# Patient Record
Sex: Male | Born: 1945 | Race: White | Hispanic: No | Marital: Married | Smoking: Former smoker
Health system: Southern US, Community
[De-identification: ages and names within clinical notes are randomized; demographics above are authoritative.]

## PROBLEM LIST (undated history)

## (undated) DIAGNOSIS — C801 Malignant (primary) neoplasm, unspecified: Secondary | ICD-10-CM

## (undated) DIAGNOSIS — J189 Pneumonia, unspecified organism: Secondary | ICD-10-CM

## (undated) DIAGNOSIS — I491 Atrial premature depolarization: Secondary | ICD-10-CM

## (undated) DIAGNOSIS — I10 Essential (primary) hypertension: Secondary | ICD-10-CM

## (undated) DIAGNOSIS — M1990 Unspecified osteoarthritis, unspecified site: Secondary | ICD-10-CM

## (undated) DIAGNOSIS — I499 Cardiac arrhythmia, unspecified: Secondary | ICD-10-CM

## (undated) DIAGNOSIS — D696 Thrombocytopenia, unspecified: Secondary | ICD-10-CM

## (undated) DIAGNOSIS — H9190 Unspecified hearing loss, unspecified ear: Secondary | ICD-10-CM

## (undated) DIAGNOSIS — K219 Gastro-esophageal reflux disease without esophagitis: Secondary | ICD-10-CM

## (undated) DIAGNOSIS — R7989 Other specified abnormal findings of blood chemistry: Secondary | ICD-10-CM

## (undated) HISTORY — PX: HERNIA REPAIR: SHX51

## (undated) HISTORY — PX: TONSILLECTOMY: SUR1361

## (undated) HISTORY — DX: Atrial premature depolarization: I49.1

## (undated) HISTORY — DX: Essential (primary) hypertension: I10

## (undated) HISTORY — PX: OTHER SURGICAL HISTORY: SHX169

---

## 1968-10-24 HISTORY — PX: HERNIA REPAIR: SHX51

## 2003-04-14 ENCOUNTER — Emergency Department (HOSPITAL_COMMUNITY): Admission: EM | Admit: 2003-04-14 | Discharge: 2003-04-14 | Payer: Self-pay | Admitting: Emergency Medicine

## 2003-04-14 ENCOUNTER — Encounter: Payer: Self-pay | Admitting: Emergency Medicine

## 2005-10-29 ENCOUNTER — Emergency Department (HOSPITAL_COMMUNITY): Admission: EM | Admit: 2005-10-29 | Discharge: 2005-10-29 | Payer: Self-pay | Admitting: Emergency Medicine

## 2011-11-15 DIAGNOSIS — H9319 Tinnitus, unspecified ear: Secondary | ICD-10-CM | POA: Diagnosis not present

## 2011-11-15 DIAGNOSIS — H903 Sensorineural hearing loss, bilateral: Secondary | ICD-10-CM | POA: Diagnosis not present

## 2012-08-01 DIAGNOSIS — Z1331 Encounter for screening for depression: Secondary | ICD-10-CM | POA: Diagnosis not present

## 2012-08-01 DIAGNOSIS — R000 Tachycardia, unspecified: Secondary | ICD-10-CM | POA: Diagnosis not present

## 2012-08-01 DIAGNOSIS — Z23 Encounter for immunization: Secondary | ICD-10-CM | POA: Diagnosis not present

## 2012-08-01 DIAGNOSIS — Z0000 Encounter for general adult medical examination without abnormal findings: Secondary | ICD-10-CM | POA: Diagnosis not present

## 2012-08-01 DIAGNOSIS — Z131 Encounter for screening for diabetes mellitus: Secondary | ICD-10-CM | POA: Diagnosis not present

## 2012-08-01 DIAGNOSIS — E7800 Pure hypercholesterolemia, unspecified: Secondary | ICD-10-CM | POA: Diagnosis not present

## 2012-08-22 DIAGNOSIS — Z8241 Family history of sudden cardiac death: Secondary | ICD-10-CM | POA: Diagnosis not present

## 2012-08-22 DIAGNOSIS — Z0389 Encounter for observation for other suspected diseases and conditions ruled out: Secondary | ICD-10-CM | POA: Diagnosis not present

## 2012-08-22 DIAGNOSIS — R000 Tachycardia, unspecified: Secondary | ICD-10-CM | POA: Diagnosis not present

## 2012-10-04 DIAGNOSIS — H7310 Chronic myringitis, unspecified ear: Secondary | ICD-10-CM | POA: Diagnosis not present

## 2012-10-04 DIAGNOSIS — H9319 Tinnitus, unspecified ear: Secondary | ICD-10-CM | POA: Diagnosis not present

## 2012-10-04 DIAGNOSIS — H903 Sensorineural hearing loss, bilateral: Secondary | ICD-10-CM | POA: Diagnosis not present

## 2013-01-15 DIAGNOSIS — R000 Tachycardia, unspecified: Secondary | ICD-10-CM | POA: Diagnosis not present

## 2013-01-15 DIAGNOSIS — Z8241 Family history of sudden cardiac death: Secondary | ICD-10-CM | POA: Diagnosis not present

## 2013-01-15 DIAGNOSIS — R0602 Shortness of breath: Secondary | ICD-10-CM | POA: Diagnosis not present

## 2013-01-15 DIAGNOSIS — R002 Palpitations: Secondary | ICD-10-CM | POA: Diagnosis not present

## 2013-01-17 DIAGNOSIS — R002 Palpitations: Secondary | ICD-10-CM | POA: Diagnosis not present

## 2013-01-18 DIAGNOSIS — R002 Palpitations: Secondary | ICD-10-CM | POA: Diagnosis not present

## 2013-01-23 DIAGNOSIS — R002 Palpitations: Secondary | ICD-10-CM | POA: Diagnosis not present

## 2013-01-23 DIAGNOSIS — R000 Tachycardia, unspecified: Secondary | ICD-10-CM | POA: Diagnosis not present

## 2013-01-23 DIAGNOSIS — Z8241 Family history of sudden cardiac death: Secondary | ICD-10-CM | POA: Diagnosis not present

## 2013-01-23 DIAGNOSIS — R0602 Shortness of breath: Secondary | ICD-10-CM | POA: Diagnosis not present

## 2013-02-04 DIAGNOSIS — R000 Tachycardia, unspecified: Secondary | ICD-10-CM | POA: Diagnosis not present

## 2013-02-04 DIAGNOSIS — R002 Palpitations: Secondary | ICD-10-CM | POA: Diagnosis not present

## 2013-02-19 DIAGNOSIS — H2510 Age-related nuclear cataract, unspecified eye: Secondary | ICD-10-CM | POA: Diagnosis not present

## 2013-02-19 DIAGNOSIS — H524 Presbyopia: Secondary | ICD-10-CM | POA: Diagnosis not present

## 2013-02-19 DIAGNOSIS — H25019 Cortical age-related cataract, unspecified eye: Secondary | ICD-10-CM | POA: Diagnosis not present

## 2013-02-19 DIAGNOSIS — H179 Unspecified corneal scar and opacity: Secondary | ICD-10-CM | POA: Diagnosis not present

## 2013-05-21 DIAGNOSIS — H9319 Tinnitus, unspecified ear: Secondary | ICD-10-CM | POA: Diagnosis not present

## 2013-05-21 DIAGNOSIS — H903 Sensorineural hearing loss, bilateral: Secondary | ICD-10-CM | POA: Diagnosis not present

## 2013-05-21 DIAGNOSIS — H7310 Chronic myringitis, unspecified ear: Secondary | ICD-10-CM | POA: Diagnosis not present

## 2013-07-22 DIAGNOSIS — R000 Tachycardia, unspecified: Secondary | ICD-10-CM | POA: Diagnosis not present

## 2013-07-22 DIAGNOSIS — R002 Palpitations: Secondary | ICD-10-CM | POA: Diagnosis not present

## 2013-08-06 ENCOUNTER — Other Ambulatory Visit: Payer: Self-pay | Admitting: Internal Medicine

## 2013-08-06 DIAGNOSIS — Z1331 Encounter for screening for depression: Secondary | ICD-10-CM | POA: Diagnosis not present

## 2013-08-06 DIAGNOSIS — R000 Tachycardia, unspecified: Secondary | ICD-10-CM | POA: Diagnosis not present

## 2013-08-06 DIAGNOSIS — I779 Disorder of arteries and arterioles, unspecified: Secondary | ICD-10-CM | POA: Diagnosis not present

## 2013-08-06 DIAGNOSIS — Z79899 Other long term (current) drug therapy: Secondary | ICD-10-CM | POA: Diagnosis not present

## 2013-08-06 DIAGNOSIS — Z0000 Encounter for general adult medical examination without abnormal findings: Secondary | ICD-10-CM | POA: Diagnosis not present

## 2013-08-06 DIAGNOSIS — E7800 Pure hypercholesterolemia, unspecified: Secondary | ICD-10-CM | POA: Diagnosis not present

## 2013-08-07 ENCOUNTER — Ambulatory Visit
Admission: RE | Admit: 2013-08-07 | Discharge: 2013-08-07 | Disposition: A | Discharge status: Home / Self Care | Payer: Medicare Other | Source: Ambulatory Visit | Attending: Internal Medicine | Admitting: Internal Medicine

## 2013-08-07 DIAGNOSIS — Z0000 Encounter for general adult medical examination without abnormal findings: Secondary | ICD-10-CM

## 2013-08-07 DIAGNOSIS — I658 Occlusion and stenosis of other precerebral arteries: Secondary | ICD-10-CM | POA: Diagnosis not present

## 2013-08-08 DIAGNOSIS — Z23 Encounter for immunization: Secondary | ICD-10-CM | POA: Diagnosis not present

## 2014-01-24 DIAGNOSIS — J069 Acute upper respiratory infection, unspecified: Secondary | ICD-10-CM | POA: Diagnosis not present

## 2014-01-24 DIAGNOSIS — J209 Acute bronchitis, unspecified: Secondary | ICD-10-CM | POA: Diagnosis not present

## 2014-02-06 ENCOUNTER — Encounter (HOSPITAL_COMMUNITY): Payer: Self-pay | Admitting: Emergency Medicine

## 2014-02-06 ENCOUNTER — Emergency Department (HOSPITAL_COMMUNITY): Payer: Medicare Other

## 2014-02-06 ENCOUNTER — Inpatient Hospital Stay (HOSPITAL_COMMUNITY)
Admission: EM | Admit: 2014-02-06 | Discharge: 2014-02-10 | DRG: 871 | Disposition: A | Discharge status: Home / Self Care | Payer: Medicare Other | Attending: Internal Medicine | Admitting: Internal Medicine

## 2014-02-06 DIAGNOSIS — E871 Hypo-osmolality and hyponatremia: Secondary | ICD-10-CM | POA: Diagnosis present

## 2014-02-06 DIAGNOSIS — E860 Dehydration: Secondary | ICD-10-CM

## 2014-02-06 DIAGNOSIS — R7401 Elevation of levels of liver transaminase levels: Secondary | ICD-10-CM | POA: Diagnosis not present

## 2014-02-06 DIAGNOSIS — J209 Acute bronchitis, unspecified: Secondary | ICD-10-CM | POA: Diagnosis present

## 2014-02-06 DIAGNOSIS — E872 Acidosis, unspecified: Secondary | ICD-10-CM | POA: Diagnosis present

## 2014-02-06 DIAGNOSIS — I959 Hypotension, unspecified: Secondary | ICD-10-CM | POA: Diagnosis present

## 2014-02-06 DIAGNOSIS — Z79899 Other long term (current) drug therapy: Secondary | ICD-10-CM | POA: Diagnosis not present

## 2014-02-06 DIAGNOSIS — J189 Pneumonia, unspecified organism: Secondary | ICD-10-CM | POA: Diagnosis not present

## 2014-02-06 DIAGNOSIS — R209 Unspecified disturbances of skin sensation: Secondary | ICD-10-CM | POA: Diagnosis not present

## 2014-02-06 DIAGNOSIS — K7200 Acute and subacute hepatic failure without coma: Secondary | ICD-10-CM | POA: Diagnosis present

## 2014-02-06 DIAGNOSIS — Z87891 Personal history of nicotine dependence: Secondary | ICD-10-CM | POA: Diagnosis not present

## 2014-02-06 DIAGNOSIS — E876 Hypokalemia: Secondary | ICD-10-CM | POA: Diagnosis present

## 2014-02-06 DIAGNOSIS — R918 Other nonspecific abnormal finding of lung field: Secondary | ICD-10-CM | POA: Diagnosis not present

## 2014-02-06 DIAGNOSIS — R059 Cough, unspecified: Secondary | ICD-10-CM | POA: Diagnosis present

## 2014-02-06 DIAGNOSIS — R945 Abnormal results of liver function studies: Secondary | ICD-10-CM

## 2014-02-06 DIAGNOSIS — A419 Sepsis, unspecified organism: Secondary | ICD-10-CM | POA: Diagnosis not present

## 2014-02-06 DIAGNOSIS — R0602 Shortness of breath: Secondary | ICD-10-CM | POA: Diagnosis not present

## 2014-02-06 DIAGNOSIS — R0902 Hypoxemia: Secondary | ICD-10-CM | POA: Diagnosis present

## 2014-02-06 DIAGNOSIS — K7689 Other specified diseases of liver: Secondary | ICD-10-CM | POA: Diagnosis not present

## 2014-02-06 DIAGNOSIS — R110 Nausea: Secondary | ICD-10-CM | POA: Diagnosis not present

## 2014-02-06 DIAGNOSIS — R7402 Elevation of levels of lactic acid dehydrogenase (LDH): Secondary | ICD-10-CM | POA: Diagnosis not present

## 2014-02-06 DIAGNOSIS — D696 Thrombocytopenia, unspecified: Secondary | ICD-10-CM | POA: Diagnosis not present

## 2014-02-06 DIAGNOSIS — R7989 Other specified abnormal findings of blood chemistry: Secondary | ICD-10-CM | POA: Diagnosis not present

## 2014-02-06 DIAGNOSIS — R6520 Severe sepsis without septic shock: Secondary | ICD-10-CM

## 2014-02-06 DIAGNOSIS — R509 Fever, unspecified: Secondary | ICD-10-CM | POA: Diagnosis not present

## 2014-02-06 DIAGNOSIS — R05 Cough: Secondary | ICD-10-CM | POA: Diagnosis present

## 2014-02-06 DIAGNOSIS — J9819 Other pulmonary collapse: Secondary | ICD-10-CM | POA: Diagnosis not present

## 2014-02-06 DIAGNOSIS — E8729 Other acidosis: Secondary | ICD-10-CM | POA: Diagnosis present

## 2014-02-06 LAB — SAVE SMEAR

## 2014-02-06 LAB — COMPREHENSIVE METABOLIC PANEL
ALT: 38 U/L (ref 0–53)
AST: 56 U/L — AB (ref 0–37)
Albumin: 3.6 g/dL (ref 3.5–5.2)
Alkaline Phosphatase: 85 U/L (ref 39–117)
BUN: 18 mg/dL (ref 6–23)
CALCIUM: 8.6 mg/dL (ref 8.4–10.5)
CO2: 20 mEq/L (ref 19–32)
CREATININE: 0.89 mg/dL (ref 0.50–1.35)
Chloride: 98 mEq/L (ref 96–112)
GFR calc non Af Amer: 86 mL/min — ABNORMAL LOW (ref >90)
GLUCOSE: 98 mg/dL (ref 70–99)
Potassium: 3.4 mEq/L — ABNORMAL LOW (ref 3.7–5.3)
Sodium: 136 mEq/L — ABNORMAL LOW (ref 137–147)
TOTAL PROTEIN: 7.3 g/dL (ref 6.0–8.3)
Total Bilirubin: 0.8 mg/dL (ref 0.3–1.2)

## 2014-02-06 LAB — CBC WITH DIFFERENTIAL/PLATELET
BASOS ABS: 0 10*3/uL (ref 0.0–0.1)
Basophils Relative: 0 % (ref 0–1)
EOS ABS: 0.1 10*3/uL (ref 0.0–0.7)
Eosinophils Relative: 1 % (ref 0–5)
HCT: 42.4 % (ref 39.0–52.0)
Hemoglobin: 14.8 g/dL (ref 13.0–17.0)
Lymphocytes Relative: 7 % — ABNORMAL LOW (ref 12–46)
Lymphs Abs: 0.4 10*3/uL — ABNORMAL LOW (ref 0.7–4.0)
MCH: 31 pg (ref 26.0–34.0)
MCHC: 34.9 g/dL (ref 30.0–36.0)
MCV: 88.7 fL (ref 78.0–100.0)
Monocytes Absolute: 0.1 10*3/uL (ref 0.1–1.0)
Monocytes Relative: 2 % — ABNORMAL LOW (ref 3–12)
NEUTROS PCT: 90 % — AB (ref 43–77)
Neutro Abs: 5.2 10*3/uL (ref 1.7–7.7)
PLATELETS: 105 10*3/uL — AB (ref 150–400)
RBC: 4.78 MIL/uL (ref 4.22–5.81)
RDW: 12.8 % (ref 11.5–15.5)
WBC: 5.8 10*3/uL (ref 4.0–10.5)

## 2014-02-06 LAB — CBC
HEMATOCRIT: 37.1 % — AB (ref 39.0–52.0)
HEMOGLOBIN: 12.8 g/dL — AB (ref 13.0–17.0)
MCH: 30.5 pg (ref 26.0–34.0)
MCHC: 34.5 g/dL (ref 30.0–36.0)
MCV: 88.3 fL (ref 78.0–100.0)
Platelets: 80 10*3/uL — ABNORMAL LOW (ref 150–400)
RBC: 4.2 MIL/uL — ABNORMAL LOW (ref 4.22–5.81)
RDW: 13 % (ref 11.5–15.5)
WBC: 7.9 10*3/uL (ref 4.0–10.5)

## 2014-02-06 LAB — TROPONIN I: Troponin I: <0.3 ng/mL (ref <0.30)

## 2014-02-06 LAB — PRO B NATRIURETIC PEPTIDE: Pro B Natriuretic peptide (BNP): 67 pg/mL (ref 0–125)

## 2014-02-06 LAB — INFLUENZA PANEL BY PCR (TYPE A & B)
H1N1FLUPCR: NOT DETECTED
INFLBPCR: NEGATIVE
Influenza A By PCR: NEGATIVE

## 2014-02-06 LAB — I-STAT CG4 LACTIC ACID, ED: Lactic Acid, Venous: 4.36 mmol/L — ABNORMAL HIGH (ref 0.5–2.2)

## 2014-02-06 MED ORDER — ONDANSETRON HCL 4 MG/2ML IJ SOLN
4.0000 mg | Freq: Once | INTRAMUSCULAR | Status: AC
  Administered 2014-02-06: 4 mg via INTRAVENOUS
  Filled 2014-02-06: qty 2

## 2014-02-06 MED ORDER — ONDANSETRON HCL 4 MG/2ML IJ SOLN
4.0000 mg | Freq: Four times a day (QID) | INTRAMUSCULAR | Status: DC | PRN
  Administered 2014-02-06: 4 mg via INTRAVENOUS
  Filled 2014-02-06: qty 2

## 2014-02-06 MED ORDER — SODIUM CHLORIDE 0.9 % IJ SOLN
3.0000 mL | Freq: Two times a day (BID) | INTRAMUSCULAR | Status: DC
  Administered 2014-02-09 (×2): 3 mL via INTRAVENOUS

## 2014-02-06 MED ORDER — HEPARIN SODIUM (PORCINE) 5000 UNIT/ML IJ SOLN
5000.0000 [IU] | Freq: Three times a day (TID) | INTRAMUSCULAR | Status: DC
  Administered 2014-02-06 – 2014-02-08 (×5): 5000 [IU] via SUBCUTANEOUS
  Filled 2014-02-06 (×8): qty 1

## 2014-02-06 MED ORDER — DEXTROSE 5 % IV SOLN
500.0000 mg | Freq: Once | INTRAVENOUS | Status: AC
  Administered 2014-02-06: 500 mg via INTRAVENOUS

## 2014-02-06 MED ORDER — ACETAMINOPHEN 650 MG RE SUPP: 650.0000 mg | Freq: Four times a day (QID) | RECTAL | Status: DC | PRN

## 2014-02-06 MED ORDER — POTASSIUM CHLORIDE CRYS ER 20 MEQ PO TBCR
40.0000 meq | EXTENDED_RELEASE_TABLET | Freq: Two times a day (BID) | ORAL | Status: DC
  Filled 2014-02-06: qty 4

## 2014-02-06 MED ORDER — IBUPROFEN 800 MG PO TABS
800.0000 mg | ORAL_TABLET | Freq: Once | ORAL | Status: AC
  Administered 2014-02-06: 800 mg via ORAL
  Filled 2014-02-06: qty 1

## 2014-02-06 MED ORDER — POLYETHYLENE GLYCOL 3350 17 G PO PACK
17.0000 g | PACK | Freq: Every day | ORAL | Status: DC | PRN
  Filled 2014-02-06: qty 1

## 2014-02-06 MED ORDER — SODIUM CHLORIDE 0.9 % IV SOLN
INTRAVENOUS | Status: DC
  Administered 2014-02-06 – 2014-02-08 (×6): via INTRAVENOUS

## 2014-02-06 MED ORDER — DEXTROSE 5 % IV SOLN
500.0000 mg | Freq: Every day | INTRAVENOUS | Status: DC
  Administered 2014-02-07 – 2014-02-09 (×4): 500 mg via INTRAVENOUS
  Filled 2014-02-06 (×5): qty 500

## 2014-02-06 MED ORDER — ONDANSETRON HCL 4 MG PO TABS: 4.0000 mg | ORAL_TABLET | Freq: Four times a day (QID) | ORAL | Status: DC | PRN

## 2014-02-06 MED ORDER — SODIUM CHLORIDE 0.9 % IV BOLUS (SEPSIS)
500.0000 mL | Freq: Once | INTRAVENOUS | Status: AC
  Administered 2014-02-06: 500 mL via INTRAVENOUS

## 2014-02-06 MED ORDER — ACETAMINOPHEN 325 MG PO TABS
650.0000 mg | ORAL_TABLET | Freq: Once | ORAL | Status: DC
  Filled 2014-02-06: qty 2

## 2014-02-06 MED ORDER — ACETAMINOPHEN 325 MG PO TABS
650.0000 mg | ORAL_TABLET | Freq: Four times a day (QID) | ORAL | Status: DC | PRN
  Administered 2014-02-06 – 2014-02-09 (×8): 650 mg via ORAL
  Filled 2014-02-06 (×8): qty 2

## 2014-02-06 MED ORDER — MAGNESIUM OXIDE 400 (241.3 MG) MG PO TABS
400.0000 mg | ORAL_TABLET | Freq: Two times a day (BID) | ORAL | Status: DC
  Administered 2014-02-07: 400 mg via ORAL
  Filled 2014-02-06 (×2): qty 1

## 2014-02-06 MED ORDER — DEXTROSE 5 % IV SOLN
1.0000 g | INTRAVENOUS | Status: DC
  Administered 2014-02-06 – 2014-02-09 (×4): 1 g via INTRAVENOUS
  Filled 2014-02-06 (×6): qty 10

## 2014-02-06 MED ORDER — SODIUM CHLORIDE 0.9 % IV BOLUS (SEPSIS)
1000.0000 mL | Freq: Once | INTRAVENOUS | Status: AC
  Administered 2014-02-06: 1000 mL via INTRAVENOUS

## 2014-02-06 MED ORDER — ACETAMINOPHEN 325 MG PO TABS
325.0000 mg | ORAL_TABLET | Freq: Once | ORAL | Status: AC
  Administered 2014-02-06: 325 mg via ORAL

## 2014-02-06 NOTE — H&P (Signed)
[facility] History and Physical  Dartanian Knaggs JQB:[id number] DOB: 1946/03/18 DOA: 02/06/2014  Referring physician: Dr. Wyvonnia Dusky PCP: No primary provider on file.   Chief Complaint: fever cough  HPI: Geoffrey Duran is a 68 y.o. male  With no significant past medical history that comes in for productive cough and fevers. He relates this started about 2 weeks prior to admission he went to see his primary care doctor who started him on Levaquin. He does relate that the cough improved and he was feeling much better but recurred. He went to his primary care doctor today to see if see how he was doing and he was having fever with ongoing productive cough he was given a shot of Rocephin and sent to the emergency room. He relates no recent dental procedures urological or any colonoscopies. No cuts or sores. No sick contacts, per patient he was tested for the flu and was negative. [place] and he relates no recent tick bites no cuts by any animals. No recent travel history no nausea vomiting or diarrhea. He relates no rashes  In the ED: Blood cultures were drawn he got a dose of IV azithromycin, A basic metabolic panel was done that shows mild hyponatremia with hypokalemia lactic acid of 4, chest x-ray as below. A CBC was done that shows mild thrombocytopenia normal white count with no left shift. He was initially hypoxic when he came to the ED to 84%.  Review of Systems:  Constitutional:  No weight loss, night sweats, Fevers, chills, fatigue.  HEENT:  No headaches, Difficulty swallowing,Tooth/dental problems,Sore throat,  No sneezing, itching, ear ache, nasal congestion, post nasal drip,  Cardio-vascular:  No chest pain, Orthopnea, PND, swelling in lower extremities, anasarca, dizziness, palpitations  GI:  No heartburn, indigestion, abdominal pain, nausea, vomiting, diarrhea, change in bowel habits, loss of appetite  Resp:  No shortness of breath with exertion or at rest. No  excess mucus, no productive cough, No non-productive cough, No coughing up of blood.No change in color of mucus.No wheezing.No chest wall deformity  Skin:  no rash or lesions.  GU:  no dysuria, change in color of urine, no urgency or frequency. No flank pain.  Musculoskeletal:  No joint pain or swelling. No decreased range of motion. No back pain.  Psych:  No change in mood or affect. No depression or anxiety. No memory loss.   Past Medical History  Diagnosis Date  . Heart flutter, ventricular    Past Surgical History  Procedure Laterality Date  . Bilateral rotator surgery     Social History:  reports that he quit smoking about 46 years ago. He does not have any smokeless tobacco history on file. He reports that he does not drink alcohol. His drug history is not on file.  No Known Allergies  Family History  Problem Relation Age of Onset  . Sudden death Mother      Prior to Admission medications   Medication Sig Start Date End Date Taking? Authorizing Provider  Guaifenesin-Codeine (CHERATUSSIN AC PO) Take 10 mLs by mouth every 6 (six) hours as needed. cough   Yes Historical Provider, MD  levofloxacin (LEVAQUIN) 750 MG tablet Take 750 mg by mouth daily. Started this am for respiratory infection.   Yes Historical Provider, MD  metoprolol succinate (TOPROL-XL) 25 MG 24 hr tablet Take 25 mg by mouth daily.   Yes Historical Provider, MD  omeprazole (PRILOSEC) 20 MG capsule Take 20 mg by mouth daily.  Yes Historical Provider, MD   Physical Exam: Filed Vitals:   02/06/14 1432  BP:   Pulse:   Temp: 103.1 F (39.5 C)  Resp:     BP 128/53  Pulse 106  Temp(Src) 103.1 F (39.5 C) (Rectal)  Resp 20  Ht 6' (1.829 m)  Wt 86.183 kg (190 lb)  BMI 25.76 kg/m2  SpO2 95%  General:  Appears calm and uncomfortable Eyes: PERRL, normal lids, irises & conjunctiva ENT: grossly normal hearing, lips & tongue Neck: no LAD, masses or thyromegaly Cardiovascular: RRR, no m/r/g. No LE  edema. Respiratory: CTA bilaterally, no w/r/r. Normal respiratory effort. Abdomen: soft, ntnd Skin: no rash or induration seen on limited exam Musculoskeletal: grossly normal tone BUE/BLE Psychiatric: grossly normal mood and affect, speech fluent and appropriate Neurologic: grossly non-focal.          Labs on Admission:  Basic Metabolic Panel:  Recent Labs Lab 02/06/14 1410  NA 136*  K 3.4*  CL 98  CO2 20  GLUCOSE 98  BUN 18  CREATININE 0.89  CALCIUM 8.6   Liver Function Tests:  Recent Labs Lab 02/06/14 1410  AST 56*  ALT 38  ALKPHOS 85  BILITOT 0.8  PROT 7.3  ALBUMIN 3.6   No results found for this basename: LIPASE, AMYLASE,  in the last 168 hours No results found for this basename: AMMONIA,  in the last 168 hours CBC:  Recent Labs Lab 02/06/14 1410  WBC 5.8  NEUTROABS 5.2  HGB 14.8  HCT 42.4  MCV 88.7  PLT 105*   Cardiac Enzymes:  Recent Labs Lab 02/06/14 1410  TROPONINI <0.30    BNP (last 3 results)  Recent Labs  02/06/14 1410  PROBNP 67.0   CBG: No results found for this basename: GLUCAP,  in the last 168 hours  Radiological Exams on Admission: Ct Head Wo Contrast  02/06/2014   CLINICAL DATA:  Strange disoriented tingling after receiving an injection  EXAM: CT HEAD WITHOUT CONTRAST  TECHNIQUE: Contiguous axial images were obtained from the base of the skull through the vertex without intravenous contrast.  COMPARISON:  None.  FINDINGS: The ventricles are normal in size and position. There is no intracranial hemorrhage nor intracranial mass effect. There is no evidence of an evolving ischemic infarction. The cerebellum and brainstem are normal in density. There are no abnormal intracranial calcifications.  At bone window settings there is soft tissue fullness in the left maxillary sinus most compatible with a retention cyst or polyp. The mastoid air cells are well pneumatized.  IMPRESSION: There is no evidence of an acute ischemic or  hemorrhagic event. There is no intracranial mass effect or hydrocephalus. No other acute intracranial abnormality is demonstrated either.   Electronically Signed   By: David  Martinique   On: 02/06/2014 15:54   Dg Chest Port 1 View  02/06/2014   CLINICAL DATA:  Short of breath, nausea  EXAM: PORTABLE CHEST - 1 VIEW  COMPARISON:  None.  FINDINGS: Normal cardiac silhouette. Lung bases are clear. No effusion, infiltrate, or pneumothorax. Mild left basilar atelectasis.  IMPRESSION: Mild left basilar atelectasis.  Otherwise no acute findings   Electronically Signed   By: Suzy Bouchard M.D.   On: 02/06/2014 14:39    EKG: Independently reviewed. Sinus rhythm  Assessment/Plan: Sepsis/Fever and chills/ Cough/anion gap metabolic acidosis: - We'll given 2 L of normal saline and admitted to step down, check strict I.'s and O's and continue him on normal saline. - Most likely cause  of fever chills and cough is a lower respiratory tract infection chest x-ray was done that shows no acute infiltrates, so the most likely cause would be an acute bronchitis. We'll go ahead and start him on Rocephin and azithromycin check blood cultures times. He could also be significantly dehydrated and a pneumonia not show up on the chest x-ray. So after he is well hydrated we could repeat the chest x-ray. - Use Tylenol for fevers. - Check an HIV influenza panel and urine Legionella. - Repeat lactic acid in the morning.  Thrombocytopenia: - LFTs are within normal limits, this could be secondary to sepsis. He is on no medications that would cause thrombocytopenia. Recheck a CBC with differential tomorrow morning. - Check peripheral smear.  Hypokalemia: Due to decreased intravascular volume replete. Also repeat a mag.  Code Status: full Family Communication: wife Disposition Plan: inpatient  Time spent: 28 minutes  [facility] Pager [phone]

## 2014-02-06 NOTE — ED Notes (Signed)
Shortness of breath, back pain and chest pain r/t coughing.  Patient was on antibiotics two weeks ago for flu like symptoms and coughing for ten days, symptoms returned and he went to dr this morning and received rocephin shot and levaquin po. He also has left back/flank pain.  Tested negative at dr office for flu.

## 2014-02-06 NOTE — ED Notes (Signed)
MD at bedside. 

## 2014-02-06 NOTE — ED Notes (Signed)
NOTIFIED DR. Wilson Singer OF PATIENTS LAB RESULTS OF CG4+ LACTIC ACID ,02/06/2014.

## 2014-02-06 NOTE — ED Provider Notes (Signed)
CSN: [id number]     Arrival date & time 02/06/14  1221 History   First MD Initiated Contact with Patient 02/06/14 1237     Chief Complaint  Patient presents with  . Shortness of Breath     (Consider location/radiation/quality/duration/timing/severity/associated sxs/prior Treatment) The history is provided by the patient and the spouse. No language interpreter was used.  Geoffrey Duran is a 68 year old male with past medical history of heart flutter followed by cardiology presenting to the ED with chest pain and shortness of breath. As per patient, reported that he has not been feeling well for the past 2 weeks. Stated he was seen and assessed by his primary care provider who started patient on Levaquin and was taken for 10 days. Reported that last night he started to experience chest pain localized to the Center of his chest described as a pressure sensation that is intermittent with drifting to either side of the chest wall. Stated that he is associated shortness of breath that worsened this morning. Stated that he became very restless and has been experiencing lower back pain. Wife reported that patient was seen and assessed by his primary care provider this morning who gave the patient a shot of Rocephin and started the patient on Levaquin again. Wife reported that patient started to experience shakes and tremors short after Rocephin was administered, wife became concerned and was nervous that patient was having an allergic reaction to the medication. Stated that later in the afternoon patient experienced headache with onset of fever and onset of neck discomfort-patient reports he continuously has neck pain and that this is nothing new. Stated that patient has been having a cough, mainly dry, was productive last week. Wife reported that patient has cyanosis of the hands, was brought to the fire department where EMS picked the patient up and brought him to the ED. Patient reports he is aching all over  generalized. Denied numbness, tingling, weakness, sudden loss of vision, blurred vision, photophobia, headache, melena, hematochezia, dysuria, hematuria. PCP none  Past Medical History  Diagnosis Date  . Heart flutter, ventricular    Past Surgical History  Procedure Laterality Date  . Bilateral rotator surgery     Family History  Problem Relation Age of Onset  . Sudden death Mother    History  Substance Use Topics  . Smoking status: Former Smoker    Quit date: 02/07/1968  . Smokeless tobacco: Not on file  . Alcohol Use: No    Review of Systems  Constitutional: Negative for fever and chills.  Respiratory: Positive for cough and shortness of breath. Negative for chest tightness.   Cardiovascular: Positive for chest pain.  Gastrointestinal: Positive for nausea. Negative for vomiting, abdominal pain, diarrhea, constipation, blood in stool and anal bleeding.  Musculoskeletal: Negative for back pain and neck pain.  Neurological: Positive for headaches. Negative for dizziness and weakness.  All other systems reviewed and are negative.     Allergies  Review of patient's allergies indicates no known allergies.  Home Medications   Prior to Admission medications   Not on File   BP 94/50  Pulse 86  Temp(Src) 100.4 F (38 C) (Oral)  Resp 22  Ht 6' (1.829 m)  Wt 198 lb 10.2 oz (90.1 kg)  BMI 26.93 kg/m2  SpO2 92% Physical Exam  Nursing note and vitals reviewed. Constitutional: He is oriented to person, place, and time. He appears well-developed and well-nourished. No distress.  HENT:  Head: Normocephalic and atraumatic.  Dry mucous membranes  noted  Eyes: Conjunctivae and EOM are normal. Pupils are equal, round, and reactive to light. Right eye exhibits no discharge. Left eye exhibits no discharge.  Neck: Normal range of motion. Neck supple. No tracheal deviation present.  Negative neck stiffness Negative nuchal rigidity Negative cervical lymphadenopathy Negative  meningeal signs  Cardiovascular: Normal rate, regular rhythm and normal heart sounds.   Cap refill less than 3 seconds Negative swelling or pitting edema identified to lower tremors bilaterally  Pulmonary/Chest: Effort normal. No respiratory distress. He has no wheezes. He has rales.  Decreased breath sounds to bilateral lower lobes Rales identified in the left lower lobe  Abdominal: Soft. Bowel sounds are normal. There is no tenderness. There is no guarding.  Musculoskeletal: Normal range of motion.  Full ROM to upper and lower extremities without difficulty noted, negative ataxia noted.  Lymphadenopathy:    He has no cervical adenopathy.  Neurological: He is alert and oriented to person, place, and time. No cranial nerve deficit. He exhibits normal muscle tone. Coordination normal.  Cranial nerves III-XII grossly intact Strength 5+/5+ to upper and lower extremities bilaterally with resistance applied, equal distribution noted Equal grip strength Negative facial drooping Negative slurred speech Gait proper, proper balance - negative sway, negative drift, negative step-offs  Skin: Skin is warm and dry. No rash noted. He is not diaphoretic. No erythema.  Psychiatric: He has a normal mood and affect. His behavior is normal. Thought content normal.    ED Course  Procedures (including critical care time)  Patient seen and assessed by attending physician  - does not believe to be meningitis - does not recommend spinal tap at this time.   4:21 PM This provider spoke with Dr. Charlett Blake - discussed case, history, presentation, labs, imaging in great detail - this provider recommended a StepDown bed due to sepsis, admitting physician reported MedSurg bed. Patient to be admitted to [facility] as inpatient.   Results for orders placed during the hospital encounter of 02/06/14  CBC WITH DIFFERENTIAL      Result Value Ref Range   WBC 5.8  4.0 - 10.5 K/uL   RBC 4.78  4.22 - 5.81 MIL/uL   Hemoglobin  14.8  13.0 - 17.0 g/dL   HCT 42.4  39.0 - 52.0 %   MCV 88.7  78.0 - 100.0 fL   MCH 31.0  26.0 - 34.0 pg   MCHC 34.9  30.0 - 36.0 g/dL   RDW 12.8  11.5 - 15.5 %   Platelets 105 (*) 150 - 400 K/uL   Neutrophils Relative % 90 (*) 43 - 77 %   Lymphocytes Relative 7 (*) 12 - 46 %   Monocytes Relative 2 (*) 3 - 12 %   Eosinophils Relative 1  0 - 5 %   Basophils Relative 0  0 - 1 %   Neutro Abs 5.2  1.7 - 7.7 K/uL   Lymphs Abs 0.4 (*) 0.7 - 4.0 K/uL   Monocytes Absolute 0.1  0.1 - 1.0 K/uL   Eosinophils Absolute 0.1  0.0 - 0.7 K/uL   Basophils Absolute 0.0  0.0 - 0.1 K/uL   RBC Morphology POLYCHROMASIA PRESENT     Smear Review LARGE PLATELETS PRESENT    COMPREHENSIVE METABOLIC PANEL      Result Value Ref Range   Sodium 136 (*) 137 - 147 mEq/L   Potassium 3.4 (*) 3.7 - 5.3 mEq/L   Chloride 98  96 - 112 mEq/L   CO2 20  19 -  32 mEq/L   Glucose, Bld 98  70 - 99 mg/dL   BUN 18  6 - 23 mg/dL   Creatinine, Ser 0.89  0.50 - 1.35 mg/dL   Calcium 8.6  8.4 - 10.5 mg/dL   Total Protein 7.3  6.0 - 8.3 g/dL   Albumin 3.6  3.5 - 5.2 g/dL   AST 56 (*) 0 - 37 U/L   ALT 38  0 - 53 U/L   Alkaline Phosphatase 85  39 - 117 U/L   Total Bilirubin 0.8  0.3 - 1.2 mg/dL   GFR calc non Af Amer 86 (*) >90 mL/min   GFR calc Af Amer >90  >90 mL/min  TROPONIN I      Result Value Ref Range   Troponin I <0.30  <0.30 ng/mL  PRO B NATRIURETIC PEPTIDE      Result Value Ref Range   Pro B Natriuretic peptide (BNP) 67.0  0 - 125 pg/mL  INFLUENZA PANEL BY PCR (TYPE A & B, H1N1)      Result Value Ref Range   Influenza A By PCR NEGATIVE  NEGATIVE   Influenza B By PCR NEGATIVE  NEGATIVE   H1N1 flu by pcr NOT DETECTED  NOT DETECTED  SAVE SMEAR      Result Value Ref Range   Smear Review SMEAR STAINED AND AVAILABLE FOR REVIEW    CBC      Result Value Ref Range   WBC 7.9  4.0 - 10.5 K/uL   RBC 4.20 (*) 4.22 - 5.81 MIL/uL   Hemoglobin 12.8 (*) 13.0 - 17.0 g/dL   HCT 37.1 (*) 39.0 - 52.0 %   MCV 88.3  78.0 - 100.0  fL   MCH 30.5  26.0 - 34.0 pg   MCHC 34.5  30.0 - 36.0 g/dL   RDW 13.0  11.5 - 15.5 %   Platelets 80 (*) 150 - 400 K/uL  I-STAT CG4 LACTIC ACID, ED      Result Value Ref Range   Lactic Acid, Venous 4.36 (*) 0.5 - 2.2 mmol/L    Labs Review Labs Reviewed  CBC WITH DIFFERENTIAL - Abnormal; Notable for the following:    Platelets 105 (*)    Neutrophils Relative % 90 (*)    Lymphocytes Relative 7 (*)    Monocytes Relative 2 (*)    Lymphs Abs 0.4 (*)    All other components within normal limits  COMPREHENSIVE METABOLIC PANEL - Abnormal; Notable for the following:    Sodium 136 (*)    Potassium 3.4 (*)    AST 56 (*)    GFR calc non Af Amer 86 (*)    All other components within normal limits  CBC - Abnormal; Notable for the following:    RBC 4.20 (*)    Hemoglobin 12.8 (*)    HCT 37.1 (*)    Platelets 80 (*)    All other components within normal limits  I-STAT CG4 LACTIC ACID, ED - Abnormal; Notable for the following:    Lactic Acid, Venous 4.36 (*)    All other components within normal limits  URINE CULTURE  CULTURE, BLOOD (ROUTINE X 2)  CULTURE, BLOOD (ROUTINE X 2)  CULTURE, EXPECTORATED SPUTUM-ASSESSMENT  GRAM STAIN  MRSA PCR SCREENING  TROPONIN I  PRO B NATRIURETIC PEPTIDE  INFLUENZA PANEL BY PCR (TYPE A & B, H1N1)  SAVE SMEAR  HIV ANTIBODY (ROUTINE TESTING)  LEGIONELLA ANTIGEN, URINE  STREP PNEUMONIAE URINARY ANTIGEN  CREATININE, SERUM  CBC  COMPREHENSIVE METABOLIC PANEL  LACTIC ACID, PLASMA    Imaging Review Ct Head Wo Contrast  02/06/2014   CLINICAL DATA:  Strange disoriented tingling after receiving an injection  EXAM: CT HEAD WITHOUT CONTRAST  TECHNIQUE: Contiguous axial images were obtained from the base of the skull through the vertex without intravenous contrast.  COMPARISON:  None.  FINDINGS: The ventricles are normal in size and position. There is no intracranial hemorrhage nor intracranial mass effect. There is no evidence of an evolving ischemic  infarction. The cerebellum and brainstem are normal in density. There are no abnormal intracranial calcifications.  At bone window settings there is soft tissue fullness in the left maxillary sinus most compatible with a retention cyst or polyp. The mastoid air cells are well pneumatized.  IMPRESSION: There is no evidence of an acute ischemic or hemorrhagic event. There is no intracranial mass effect or hydrocephalus. No other acute intracranial abnormality is demonstrated either.   Electronically Signed   By: David  Martinique   On: 02/06/2014 15:54   Dg Chest Port 1 View  02/06/2014   CLINICAL DATA:  Short of breath, nausea  EXAM: PORTABLE CHEST - 1 VIEW  COMPARISON:  None.  FINDINGS: Normal cardiac silhouette. Lung bases are clear. No effusion, infiltrate, or pneumothorax. Mild left basilar atelectasis.  IMPRESSION: Mild left basilar atelectasis.  Otherwise no acute findings   Electronically Signed   By: Suzy Bouchard M.D.   On: 02/06/2014 14:39     EKG Interpretation   Date/Time:  Thursday February 06 2014 13:02:19 EDT Ventricular Rate:  94 PR Interval:  167 QRS Duration: 88 QT Interval:  342 QTC Calculation: 428 R Axis:   45 Text Interpretation:  Sinus rhythm Low voltage, extremity leads No  previous ECGs available Confirmed by Wyvonnia Dusky  MD, STEPHEN [phone]) on  02/06/2014 1:14:00 PM      MDM   Final diagnoses:  Sepsis  CAP (community acquired pneumonia)  Fever  Dehydration   Medications  heparin injection 5,000 Units (5,000 Units Subcutaneous Given 02/06/14 2128)  sodium chloride 0.9 % injection 3 mL (3 mLs Intravenous Not Given 02/06/14 2117)  0.9 %  sodium chloride infusion ( Intravenous New Bag/Given 02/06/14 2326)  acetaminophen (TYLENOL) tablet 650 mg (650 mg Oral Given 02/06/14 2127)    Or  acetaminophen (TYLENOL) suppository 650 mg ( Rectal See Alternative 02/06/14 2127)  polyethylene glycol (MIRALAX / GLYCOLAX) packet 17 g (not administered)  ondansetron (ZOFRAN) tablet 4 mg (  Oral See Alternative 02/06/14 2128)    Or  ondansetron (ZOFRAN) injection 4 mg (4 mg Intravenous Given 02/06/14 2128)  cefTRIAXone (ROCEPHIN) 1 g in dextrose 5 % 50 mL IVPB (1 g Intravenous New Bag/Given 02/06/14 2253)  azithromycin (ZITHROMAX) 500 mg in dextrose 5 % 250 mL IVPB (500 mg Intravenous Given 02/07/14 0013)  magnesium oxide (MAG-OX) tablet 400 mg (not administered)  potassium chloride SA (K-DUR,KLOR-CON) CR tablet 40 mEq (40 mEq Oral Given 02/07/14 0030)  sodium chloride 0.9 % bolus 1,000 mL (not administered)  antiseptic oral rinse (BIOTENE) solution 15 mL (not administered)  sodium chloride 0.9 % bolus 1,000 mL (0 mLs Intravenous Stopped 02/06/14 1843)  ondansetron (ZOFRAN) injection 4 mg (4 mg Intravenous Given 02/06/14 1459)  acetaminophen (TYLENOL) tablet 325 mg (325 mg Oral Given 02/06/14 1459)  azithromycin (ZITHROMAX) 500 mg in dextrose 5 % 250 mL IVPB (0 mg Intravenous Stopped 02/06/14 1654)  sodium chloride 0.9 % bolus 1,000 mL (0 mLs Intravenous Stopped 02/06/14 1714)  sodium chloride  0.9 % bolus 500 mL (0 mLs Intravenous Stopped 02/06/14 2212)  sodium chloride 0.9 % bolus 1,000 mL (0 mLs Intravenous Stopped 02/06/14 2104)  ibuprofen (ADVIL,MOTRIN) tablet 800 mg (800 mg Oral Given 02/06/14 2246)  sodium chloride 0.9 % bolus 1,000 mL (1,000 mLs Intravenous New Bag/Given 02/06/14 2214)    Patient presenting to the ED with chest pain shortness of breath that started last night. Reported the pain is localized to the Center of his chest described as a pressure sensation that Gerald Stabs T. the side of the chest wall. Stated that he has been having shortness of breath associated with his chest pain as well as when at rest. Stated that she had a headache that started today shortly after being given an IM injection of resection when he was seen by his primary care provider. Stated that he continues to have headaches. Reported fever-denied use of Tylenol ibuprofen. Stated that he has not been feeling  well for the past 2 weeks, was placed on Levaquin last week with minimal relief. Alert and oriented. GCS 15. Heart rate tachycardic, normal rhythm. Radial and DP pulses 2+ bilaterally. Negative cyanosis noted. Cap refill less than 3 seconds. Decreased breath sounds identified to lower lobes bilaterally-Rales and crackles noted to left lower lobe. Bowel sounds normoactive in all 4 quadrants-negative pain upon palpation-abdomen soft and nontender. Full range of motion to upper and lower extremities bilaterally without difficulty or ataxia. Strength intact with equal distribution, equal grip strength. Negative facial droop or slurred speech noted. Upon arrival to the ED patient oral temperature of 102.65F, rectal was 103.69F. Pulse ox is 84% while on room air upon arrival to the ED-patient does not use oxygen therapy at home. Patient placed on 2 L of oxygen via nasal cannula and has increased to 96% on room air. Tylenol and minister in ED setting. IV fluids started. EKG noted normal sinus rhythm with a heart rate of 94 beats per minute-no previous EKG noted. First troponin negative elevation. CBC negative elevated white blood cell count identified. CMP noted mild low potassium of 3.4, sodium of 136. Mildly elevated AST of 56. BNP negative elevation. Lactic acid elevated at 4.36. CT head negative acute intracranial abnormalities noted-negative acute ischemia, hemorrhagic event noted. Chest x-ray mild left basilar atelectasis noted with negative acute infiltrate, pneumothorax or effusion noted. CT angiogram of chest ordered by attending physician. Patient presents with shortness of breath, fever. IV fluids and IV antibiotics administered in ED setting for coverage of pneumonia. Patient appears dehydrated. Patient to be admitted to the hospital - sepsis noted with tachycardia, hypoxia, fever. Suspicion of sepsis to be possibly due to pneumonia versus bronchitis. Patient to be admitted to the hospital at step down as  inpatient. Discussed plan for admission the patient agreed to plan.  Jamse Mead, PA-C 02/06/14 1809  Jamse Mead, PA-C 02/07/14 0050  Jamse Mead, PA-C 02/07/14 [id number]

## 2014-02-07 DIAGNOSIS — A419 Sepsis, unspecified organism: Secondary | ICD-10-CM | POA: Diagnosis not present

## 2014-02-07 LAB — URINALYSIS, ROUTINE W REFLEX MICROSCOPIC
Bilirubin Urine: NEGATIVE
GLUCOSE, UA: NEGATIVE mg/dL
Ketones, ur: NEGATIVE mg/dL
Leukocytes, UA: NEGATIVE
Nitrite: NEGATIVE
Protein, ur: NEGATIVE mg/dL
Specific Gravity, Urine: 1.011 (ref 1.005–1.030)
Urobilinogen, UA: 1 mg/dL (ref 0.0–1.0)
pH: 5.5 (ref 5.0–8.0)

## 2014-02-07 LAB — COMPREHENSIVE METABOLIC PANEL
ALBUMIN: 2.6 g/dL — AB (ref 3.5–5.2)
ALT: 99 U/L — ABNORMAL HIGH (ref 0–53)
AST: 149 U/L — ABNORMAL HIGH (ref 0–37)
Alkaline Phosphatase: 106 U/L (ref 39–117)
BILIRUBIN TOTAL: 1.4 mg/dL — AB (ref 0.3–1.2)
BUN: 19 mg/dL (ref 6–23)
CALCIUM: 6.6 mg/dL — AB (ref 8.4–10.5)
CHLORIDE: 103 meq/L (ref 96–112)
CO2: 18 mEq/L — ABNORMAL LOW (ref 19–32)
CREATININE: 1.2 mg/dL (ref 0.50–1.35)
GFR calc Af Amer: 70 mL/min — ABNORMAL LOW (ref >90)
GFR calc non Af Amer: 60 mL/min — ABNORMAL LOW (ref >90)
Glucose, Bld: 133 mg/dL — ABNORMAL HIGH (ref 70–99)
Potassium: 3.2 mEq/L — ABNORMAL LOW (ref 3.7–5.3)
Sodium: 135 mEq/L — ABNORMAL LOW (ref 137–147)
Total Protein: 5.2 g/dL — ABNORMAL LOW (ref 6.0–8.3)

## 2014-02-07 LAB — CBC
HEMATOCRIT: 36.1 % — AB (ref 39.0–52.0)
Hemoglobin: 12.3 g/dL — ABNORMAL LOW (ref 13.0–17.0)
MCH: 30.1 pg (ref 26.0–34.0)
MCHC: 34.1 g/dL (ref 30.0–36.0)
MCV: 88.5 fL (ref 78.0–100.0)
Platelets: 77 10*3/uL — ABNORMAL LOW (ref 150–400)
RBC: 4.08 MIL/uL — ABNORMAL LOW (ref 4.22–5.81)
RDW: 13.1 % (ref 11.5–15.5)
WBC: 7.8 10*3/uL (ref 4.0–10.5)

## 2014-02-07 LAB — GLUCOSE, CAPILLARY: Glucose-Capillary: 101 mg/dL — ABNORMAL HIGH (ref 70–99)

## 2014-02-07 LAB — HIV ANTIBODY (ROUTINE TESTING W REFLEX): HIV 1&2 Ab, 4th Generation: NONREACTIVE

## 2014-02-07 LAB — URINE CULTURE
COLONY COUNT: NO GROWTH
Culture: NO GROWTH
Special Requests: NORMAL

## 2014-02-07 LAB — LEGIONELLA ANTIGEN, URINE: LEGIONELLA ANTIGEN, URINE: NEGATIVE

## 2014-02-07 LAB — URINE MICROSCOPIC-ADD ON

## 2014-02-07 LAB — LACTIC ACID, PLASMA: LACTIC ACID, VENOUS: 1.4 mmol/L (ref 0.5–2.2)

## 2014-02-07 LAB — STREP PNEUMONIAE URINARY ANTIGEN: STREP PNEUMO URINARY ANTIGEN: NEGATIVE

## 2014-02-07 LAB — MRSA PCR SCREENING: MRSA by PCR: NEGATIVE

## 2014-02-07 MED ORDER — SODIUM CHLORIDE 0.9 % IV BOLUS (SEPSIS)
1000.0000 mL | Freq: Once | INTRAVENOUS | Status: AC
  Administered 2014-02-07: 1000 mL via INTRAVENOUS

## 2014-02-07 MED ORDER — IPRATROPIUM-ALBUTEROL 0.5-2.5 (3) MG/3ML IN SOLN: 3.0000 mL | RESPIRATORY_TRACT | Status: DC | PRN

## 2014-02-07 MED ORDER — ALBUTEROL SULFATE (2.5 MG/3ML) 0.083% IN NEBU
5.0000 mg | INHALATION_SOLUTION | RESPIRATORY_TRACT | Status: AC
  Administered 2014-02-07: 5 mg via RESPIRATORY_TRACT
  Filled 2014-02-07: qty 6

## 2014-02-07 MED ORDER — GUAIFENESIN ER 600 MG PO TB12
1200.0000 mg | ORAL_TABLET | Freq: Two times a day (BID) | ORAL | Status: DC | PRN
  Filled 2014-02-07: qty 2

## 2014-02-07 MED ORDER — POTASSIUM CHLORIDE CRYS ER 20 MEQ PO TBCR
40.0000 meq | EXTENDED_RELEASE_TABLET | Freq: Two times a day (BID) | ORAL | Status: AC
  Administered 2014-02-07 (×2): 40 meq via ORAL
  Filled 2014-02-07: qty 2

## 2014-02-07 MED ORDER — GUAIFENESIN ER 600 MG PO TB12
1200.0000 mg | ORAL_TABLET | Freq: Two times a day (BID) | ORAL | Status: DC
  Administered 2014-02-07: 1200 mg via ORAL
  Filled 2014-02-07 (×2): qty 2

## 2014-02-07 MED ORDER — SODIUM CHLORIDE 0.9 % IV BOLUS (SEPSIS)
500.0000 mL | Freq: Once | INTRAVENOUS | Status: AC
  Administered 2014-02-07: 500 mL via INTRAVENOUS

## 2014-02-07 MED ORDER — BIOTENE DRY MOUTH MT LIQD
15.0000 mL | Freq: Two times a day (BID) | OROMUCOSAL | Status: DC
  Administered 2014-02-07 – 2014-02-09 (×5): 15 mL via OROMUCOSAL

## 2014-02-07 MED ORDER — IPRATROPIUM BROMIDE 0.02 % IN SOLN
0.5000 mg | RESPIRATORY_TRACT | Status: AC
  Administered 2014-02-07: 0.5 mg via RESPIRATORY_TRACT
  Filled 2014-02-07: qty 2.5

## 2014-02-07 NOTE — ED Provider Notes (Signed)
Medical screening examination/treatment/procedure(s) were conducted as a shared visit with non-physician practitioner(s) and myself.  I personally evaluated the patient during the encounter.  2 weeks of cough and congestion. Treated by PCP Levaquin. Seen and rechecked today and given shot of Rocephin. Patient had shakes and tremors after receiving Rocephin and was concerned he was having an allergic reaction. Patient also complains of pain in his chest from coughing and headache. Hypoxic on arrival, febrile.  Nontoxic appearing, no meningismus, no focal neuro deficits. CTAB, RRR. CXR clear but with fever, hypoxia, will treat as PNA.   EKG Interpretation   Date/Time:  Thursday February 06 2014 13:02:19 EDT Ventricular Rate:  94 PR Interval:  167 QRS Duration: 88 QT Interval:  342 QTC Calculation: 428 R Axis:   45 Text Interpretation:  Sinus rhythm Low voltage, extremity leads No  previous ECGs available Confirmed by Wyvonnia Dusky  MD, Annalynn Centanni [phone]) on  02/06/2014 1:14:00 PM        Ezequiel Essex, MD 02/07/14 [phone]

## 2014-02-07 NOTE — Progress Notes (Signed)
Utilization review completed. Shyam Dawson, RN, BSN. 

## 2014-02-07 NOTE — Progress Notes (Signed)
Geoffrey Duran [id number]  Transfer Data: 02/07/2014 2:56 PM  Attending Provider: Cherene Altes, MD  PCP:No primary provider on file.  Code Status: Full  Geoffrey Duran is a 68 y.o. male patient transferred from 59M  -No acute distress noted.  -No complaints of shortness of breath.  -No complaints of chest pain.  Cardiac Monitoring:  Box # 01 in place.  Cardiac monitor yields:normal sinus rhythm.  Blood pressure 122/74, pulse 69, temperature 98.7 F (37.1 C), temperature source Oral, resp. rate 18, height 6' (1.829 m), weight 92.08 kg (203 lb), SpO2 95.00%.  ?  IV Fluids: IV in place, occlusive dsg intact without redness, IV cath forearm left, condition patent and no redness  normal saline.  Allergies: Review of patient's allergies indicates no known allergies.  Past Medical History:  has a past medical history of Heart flutter, ventricular.  Past Surgical History:  has past surgical history that includes bilateral rotator surgery.  Social History:  reports that he quit smoking about 46 years ago. He does not have any smokeless tobacco history on file. He reports that he does not drink alcohol.  Skin: intact  Patient/Family orientated to room. Information packet given to patient/family. Admission inpatient armband information verified with patient/family to include name and date of birth and placed on patient arm. Side rails up x 2, fall assessment and education completed with patient/family. Patient/family able to verbalize understanding of risk associated with falls and verbalized understanding to call for assistance before getting out of bed. Call light within reach. Patient/family able to voice and demonstrate understanding of unit orientation instructions.  Will continue to evaluate and treat per MD orders.

## 2014-02-07 NOTE — Progress Notes (Signed)
[facility] TEAM 1 - Stepdown/ICU TEAM Progress Note  Geoffrey Duran QJJ:[id number] DOB: 28-Dec-1945 DOA: 02/06/2014  PCP: Dr. Carlena Sax [facility] has been contacted to assume care of the patient 4/18)  Admit HPI / Brief Narrative: 68 y.o. M with no significant past medical history who came in for productive cough and fevers for 2 weeks.  He went to see his PCP who started him on Levaquin. He initially improved and was feeling better but sx then recurred. He went back to his PCP the day of his admit.  He was having fever with ongoing productive cough;  he was given a shot of Rocephin and sent to the emergency room. Per patient he was tested for the flu and was negative.  In the ED:  Blood cultures were drawn and he got a dose of IV azithromycin.  A basic metabolic panel was done that showed mild hyponatremia with hypokalemia and lactic acid of 4. A CBC was done that showed mild thrombocytopenia normal white count with no left shift. He was initially hypoxic to 84%.   HPI/Subjective: The patient feels much better in general today.  He continues to cough intermittently.  He denies chest pain nausea vomiting or abdominal pain.  Assessment/Plan:  SIRS - probable sepsis  Strep pneum ag negative - flu screen negative - admit CXR w/o acute findings - UA unremarkable - suspected pneumonia as source - hemodynamically the patient has rapidly improved with volume resuscitation  Hypotension Appears to be responding to volume resuscitation - continue normal saline at 150 per hour until urine output increases significantly  Lactic acidosis Lactate has improved w/ volume - due to above  Thrombocytopenia Felt to be due to sepsis - follow trend  Hypokalemia Due to poor intake - replace and follow - check magnesium level  Mild hyponatremia Felt to be due to hypokalemia - recheck with volume resuscitation  Transaminitis Mild - likely low-grade shock liver - recheck in a.m.  Code  Status: FULL Family Communication: Spoke with patient, wife, and son at bedside at length Disposition Plan: Stable for transfer to telemetry bed - likely will be able to DC telemetry 4/18  Consultants: none  Procedures: none  Antibiotics: azithro 4/16 >> rocephin 4/16 >>  DVT prophylaxis: SQ heparin  Objective: Blood pressure 104/61, pulse 65, temperature 97.5 F (36.4 C), temperature source Oral, resp. rate 16, height 6' (1.829 m), weight 91.2 kg (201 lb 1 oz), SpO2 95.00%.  Intake/Output Summary (Last 24 hours) at 02/07/14 1130 Last data filed at 02/07/14 0700  Gross per 24 hour  Intake 3225.83 ml  Output   1000 ml  Net 2225.83 ml   Exam: General: No acute respiratory distress at rest Lungs: Bibasilar crackles appreciable in the posterior fields with good air movement throughout other fields with no wheeze Cardiovascular: Regular rate and rhythm without murmur gallop or rub normal S1 and S2 Abdomen: Nontender, nondistended, soft, bowel sounds positive, no rebound, no ascites, no appreciable mass Extremities: No significant cyanosis, clubbing, or edema bilateral lower extremities  Data Reviewed: Basic Metabolic Panel:  Recent Labs Lab 02/06/14 1410 02/07/14 0050  NA 136* 135*  K 3.4* 3.2*  CL 98 103  CO2 20 18*  GLUCOSE 98 133*  BUN 18 19  CREATININE 0.89 1.20  CALCIUM 8.6 6.6*   Liver Function Tests:  Recent Labs Lab 02/06/14 1410 02/07/14 0050  AST 56* 149*  ALT 38 99*  ALKPHOS 85 106  BILITOT 0.8 1.4*  PROT 7.3 5.2*  ALBUMIN 3.6 2.6*   CBC:  Recent Labs Lab 02/06/14 1410 02/06/14 2112 02/07/14 0050  WBC 5.8 7.9 7.8  NEUTROABS 5.2  --   --   HGB 14.8 12.8* 12.3*  HCT 42.4 37.1* 36.1*  MCV 88.7 88.3 88.5  PLT 105* 80* 77*   Cardiac Enzymes:  Recent Labs Lab 02/06/14 1410  TROPONINI <0.30   BNP (last 3 results)  Recent Labs  02/06/14 1410  PROBNP 67.0    Recent Results (from the past 240 hour(s))  CULTURE, BLOOD (ROUTINE X  2)     Status: None   Collection Time    02/06/14  2:00 PM      Result Value Ref Range Status   Specimen Description BLOOD RIGHT HAND   Final   Special Requests BOTTLES DRAWN AEROBIC AND ANAEROBIC 10CC   Final   Culture  Setup Time     Final   Value: 02/06/2014 19:10     Performed at [facility]   Culture     Final   Value:        BLOOD CULTURE RECEIVED NO GROWTH TO DATE CULTURE WILL BE HELD FOR 5 DAYS BEFORE ISSUING A FINAL NEGATIVE REPORT     Performed at [facility]   Report Status PENDING   Incomplete  CULTURE, BLOOD (ROUTINE X 2)     Status: None   Collection Time    02/06/14  2:15 PM      Result Value Ref Range Status   Specimen Description BLOOD RIGHT ARM   Final   Special Requests BOTTLES DRAWN AEROBIC AND ANAEROBIC 10CC   Final   Culture  Setup Time     Final   Value: 02/06/2014 19:10     Performed at [facility]   Culture     Final   Value:        BLOOD CULTURE RECEIVED NO GROWTH TO DATE CULTURE WILL BE HELD FOR 5 DAYS BEFORE ISSUING A FINAL NEGATIVE REPORT     Performed at [facility]   Report Status PENDING   Incomplete  MRSA PCR SCREENING     Status: None   Collection Time    02/06/14 11:36 PM      Result Value Ref Range Status   MRSA by PCR NEGATIVE  NEGATIVE Final   Comment:            The GeneXpert MRSA Assay (FDA     approved for NASAL specimens     only), is one component of a     comprehensive MRSA colonization     surveillance program. It is not     intended to diagnose MRSA     infection nor to guide or     monitor treatment for     MRSA infections.     Studies:  Recent x-ray studies have been reviewed in detail by the Attending Physician  Scheduled Meds:  Scheduled Meds: . antiseptic oral rinse  15 mL Mouth Rinse BID  . azithromycin  500 mg Intravenous QHS  . cefTRIAXone (ROCEPHIN)  IV  1 g Intravenous Q24H  . guaiFENesin  1,200 mg Oral BID  . heparin  5,000 Units Subcutaneous 3 times per day  . magnesium  oxide  400 mg Oral BID  . sodium chloride  3 mL Intravenous Q12H    Time spent on care of this patient: 35 mins   Cherene Altes , MD   [facility] Office  [phone]  Pager - Text Page per Shea Evans as per below:  On-Call/Text Page:      [email or web address]      password [email or web address]  If 7PM-7AM, please contact night-coverage [email or web address] Password [email or web address] 02/07/2014, 11:30 AM   LOS: 1 day

## 2014-02-08 ENCOUNTER — Inpatient Hospital Stay (HOSPITAL_COMMUNITY): Payer: Medicare Other

## 2014-02-08 DIAGNOSIS — I959 Hypotension, unspecified: Secondary | ICD-10-CM | POA: Diagnosis not present

## 2014-02-08 DIAGNOSIS — R110 Nausea: Secondary | ICD-10-CM | POA: Diagnosis not present

## 2014-02-08 DIAGNOSIS — A419 Sepsis, unspecified organism: Secondary | ICD-10-CM | POA: Diagnosis not present

## 2014-02-08 DIAGNOSIS — J189 Pneumonia, unspecified organism: Secondary | ICD-10-CM | POA: Diagnosis not present

## 2014-02-08 DIAGNOSIS — R918 Other nonspecific abnormal finding of lung field: Secondary | ICD-10-CM | POA: Diagnosis not present

## 2014-02-08 LAB — COMPREHENSIVE METABOLIC PANEL
ALK PHOS: 130 U/L — AB (ref 39–117)
ALT: 84 U/L — AB (ref 0–53)
AST: 85 U/L — ABNORMAL HIGH (ref 0–37)
Albumin: 2.6 g/dL — ABNORMAL LOW (ref 3.5–5.2)
BILIRUBIN TOTAL: 1.6 mg/dL — AB (ref 0.3–1.2)
BUN: 8 mg/dL (ref 6–23)
CALCIUM: 7.2 mg/dL — AB (ref 8.4–10.5)
CHLORIDE: 107 meq/L (ref 96–112)
CO2: 18 meq/L — AB (ref 19–32)
Creatinine, Ser: 0.73 mg/dL (ref 0.50–1.35)
GLUCOSE: 116 mg/dL — AB (ref 70–99)
POTASSIUM: 3.7 meq/L (ref 3.7–5.3)
SODIUM: 138 meq/L (ref 137–147)
Total Protein: 5.6 g/dL — ABNORMAL LOW (ref 6.0–8.3)

## 2014-02-08 LAB — CBC
HCT: 35.3 % — ABNORMAL LOW (ref 39.0–52.0)
Hemoglobin: 12.1 g/dL — ABNORMAL LOW (ref 13.0–17.0)
MCH: 30.3 pg (ref 26.0–34.0)
MCHC: 34.3 g/dL (ref 30.0–36.0)
MCV: 88.5 fL (ref 78.0–100.0)
PLATELETS: 45 10*3/uL — AB (ref 150–400)
RBC: 3.99 MIL/uL — ABNORMAL LOW (ref 4.22–5.81)
RDW: 13.3 % (ref 11.5–15.5)
WBC: 5.8 10*3/uL (ref 4.0–10.5)

## 2014-02-08 LAB — MAGNESIUM: Magnesium: 1.8 mg/dL (ref 1.5–2.5)

## 2014-02-08 MED ORDER — OXYCODONE HCL 5 MG PO TABS
5.0000 mg | ORAL_TABLET | Freq: Four times a day (QID) | ORAL | Status: DC | PRN
  Administered 2014-02-08 (×3): 5 mg via ORAL
  Filled 2014-02-08 (×3): qty 1

## 2014-02-08 MED ORDER — OXYCODONE HCL 5 MG PO TABS
5.0000 mg | ORAL_TABLET | Freq: Once | ORAL | Status: AC
  Administered 2014-02-08: 5 mg via ORAL
  Filled 2014-02-08: qty 1

## 2014-02-08 NOTE — Progress Notes (Signed)
Subjective: Pt feel nausea Still poor appetite- taking liquids No abdominal pain Some DOE  Objective: Vital signs in last 24 hours: Temp:  [98.3 F (36.8 C)-99.8 F (37.7 C)] 99.8 F (37.7 C) (04/18 0420) Pulse Rate:  [62-78] 75 (04/18 0420) Resp:  [18-23] 20 (04/18 0420) BP: (104-149)/(58-84) 149/84 mmHg (04/18 0420) SpO2:  [93 %-98 %] 94 % (04/18 0420) Weight:  [90.629 kg (199 lb 12.8 oz)-92.08 kg (203 lb)] 90.629 kg (199 lb 12.8 oz) (04/18 0420) Weight change: 5.897 kg (13 lb) Last BM Date: 02/07/14  Intake/Output from previous day: 04/17 0701 - 04/18 0700 In: 2229.6 [I.V.:2229.6] Out: -  Intake/Output this shift:    General appearance: alert Resp: diminished breath sounds bibasilar Cardio: regular rate and rhythm GI: soft, non-tender; bowel sounds normal; no masses,  no organomegaly  Lab Results:  Recent Labs  02/07/14 0050 02/08/14 0606  WBC 7.8 5.8  HGB 12.3* 12.1*  HCT 36.1* 35.3*  PLT 77* 45*   BMET  Recent Labs  02/07/14 0050 02/08/14 0606  NA 135* 138  K 3.2* 3.7  CL 103 107  CO2 18* 18*  GLUCOSE 133* 116*  BUN 19 8  CREATININE 1.20 0.73  CALCIUM 6.6* 7.2*    Studies/Results: Ct Head Wo Contrast  02/06/2014   CLINICAL DATA:  Strange disoriented tingling after receiving an injection  EXAM: CT HEAD WITHOUT CONTRAST  TECHNIQUE: Contiguous axial images were obtained from the base of the skull through the vertex without intravenous contrast.  COMPARISON:  None.  FINDINGS: The ventricles are normal in size and position. There is no intracranial hemorrhage nor intracranial mass effect. There is no evidence of an evolving ischemic infarction. The cerebellum and brainstem are normal in density. There are no abnormal intracranial calcifications.  At bone window settings there is soft tissue fullness in the left maxillary sinus most compatible with a retention cyst or polyp. The mastoid air cells are well pneumatized.  IMPRESSION: There is no evidence of an  acute ischemic or hemorrhagic event. There is no intracranial mass effect or hydrocephalus. No other acute intracranial abnormality is demonstrated either.   Electronically Signed   By: David  Martinique   On: 02/06/2014 15:54   Dg Chest Port 1 View  02/06/2014   CLINICAL DATA:  Short of breath, nausea  EXAM: PORTABLE CHEST - 1 VIEW  COMPARISON:  None.  FINDINGS: Normal cardiac silhouette. Lung bases are clear. No effusion, infiltrate, or pneumothorax. Mild left basilar atelectasis.  IMPRESSION: Mild left basilar atelectasis.  Otherwise no acute findings   Electronically Signed   By: Suzy Bouchard M.D.   On: 02/06/2014 14:39    Medications: I have reviewed the patient's current medications.  Assessment/Plan: SIRS - probable sepsis  Strep pneum ag negative - flu screen negative - Bilat Pneumonia- on chest xray. Blood culture negative Hypotension  Better after fluids. Lactic acidosis  Improved with hydration; in setting of infection  Thrombocytopenia  Worse- recheck- if still low- need Hematology consult Stop Heparin  scd for DVT prophalyxis Hypokalemia  Better- follow   Mild hyponatremia  Felt to be due to hypokalemia - improve  Transaminitis  Mild - likely low-grade shock liver - still elevated- with some nausea- check u/s abdomen  LOS: 2 days   Wenda Low 02/08/2014, 9:21 AM

## 2014-02-09 ENCOUNTER — Inpatient Hospital Stay (HOSPITAL_COMMUNITY): Payer: Medicare Other

## 2014-02-09 DIAGNOSIS — K7689 Other specified diseases of liver: Secondary | ICD-10-CM | POA: Diagnosis not present

## 2014-02-09 DIAGNOSIS — A419 Sepsis, unspecified organism: Secondary | ICD-10-CM | POA: Diagnosis not present

## 2014-02-09 DIAGNOSIS — I959 Hypotension, unspecified: Secondary | ICD-10-CM | POA: Diagnosis not present

## 2014-02-09 DIAGNOSIS — J189 Pneumonia, unspecified organism: Secondary | ICD-10-CM | POA: Diagnosis not present

## 2014-02-09 DIAGNOSIS — R110 Nausea: Secondary | ICD-10-CM | POA: Diagnosis not present

## 2014-02-09 LAB — COMPREHENSIVE METABOLIC PANEL
ALT: 66 U/L — AB (ref 0–53)
AST: 42 U/L — AB (ref 0–37)
Albumin: 2.9 g/dL — ABNORMAL LOW (ref 3.5–5.2)
Alkaline Phosphatase: 158 U/L — ABNORMAL HIGH (ref 39–117)
BILIRUBIN TOTAL: 0.8 mg/dL (ref 0.3–1.2)
BUN: 9 mg/dL (ref 6–23)
CHLORIDE: 105 meq/L (ref 96–112)
CO2: 23 meq/L (ref 19–32)
CREATININE: 0.73 mg/dL (ref 0.50–1.35)
Calcium: 8 mg/dL — ABNORMAL LOW (ref 8.4–10.5)
Glucose, Bld: 93 mg/dL (ref 70–99)
Potassium: 3.9 mEq/L (ref 3.7–5.3)
SODIUM: 140 meq/L (ref 137–147)
Total Protein: 6.1 g/dL (ref 6.0–8.3)

## 2014-02-09 LAB — CBC
HCT: 36.9 % — ABNORMAL LOW (ref 39.0–52.0)
HEMOGLOBIN: 12.4 g/dL — AB (ref 13.0–17.0)
MCH: 29.7 pg (ref 26.0–34.0)
MCHC: 33.6 g/dL (ref 30.0–36.0)
MCV: 88.5 fL (ref 78.0–100.0)
Platelets: 53 10*3/uL — ABNORMAL LOW (ref 150–400)
RBC: 4.17 MIL/uL — AB (ref 4.22–5.81)
RDW: 13.4 % (ref 11.5–15.5)
WBC: 5.7 10*3/uL (ref 4.0–10.5)

## 2014-02-09 NOTE — Progress Notes (Signed)
Subjective: Feel some better Low grade fever yesterday   Objective: Vital signs in last 24 hours: Temp:  [97.8 F (36.6 C)-100.6 F (38.1 C)] 97.8 F (36.6 C) (04/19 0506) Pulse Rate:  [67-80] 67 (04/19 0506) Resp:  [18-20] 18 (04/19 0506) BP: (133-154)/(82-87) 148/87 mmHg (04/19 0506) SpO2:  [93 %-96 %] 96 % (04/19 0506) Weight:  [86.682 kg (191 lb 1.6 oz)] 86.682 kg (191 lb 1.6 oz) (04/19 0506) Weight change: -5.398 kg (-11 lb 14.4 oz) Last BM Date: 02/08/14  Intake/Output from previous day: 04/18 0701 - 04/19 0700 In: 1178.8 [I.V.:878.8; IV Piggyback:300] Out: -  Intake/Output this shift:    General appearance: alert Resp: rhonchi bibasilar Cardio: regular rate and rhythm Extremities: extremities normal, atraumatic, no cyanosis or edema  Lab Results:  Recent Labs  02/07/14 0050 02/08/14 0606  WBC 7.8 5.8  HGB 12.3* 12.1*  HCT 36.1* 35.3*  PLT 77* 45*   BMET  Recent Labs  02/07/14 0050 02/08/14 0606  NA 135* 138  K 3.2* 3.7  CL 103 107  CO2 18* 18*  GLUCOSE 133* 116*  BUN 19 8  CREATININE 1.20 0.73  CALCIUM 6.6* 7.2*    Studies/Results: Dg Chest Port 1 View  02/08/2014   CLINICAL DATA:  Suspected pneumonia, now post hydration  EXAM: PORTABLE CHEST - 1 VIEW  COMPARISON:  Prior chest x-ray 02/06/2014  FINDINGS: Given differences in patient positioning, unchanged cardiac and mediastinal contours. Atherosclerotic calcification noted in the transverse aorta. Negative for pulmonary edema or significantly increased pulmonary vascular congestion. Persistent patchy right basilar airspace opacity concerning for pneumonia given the clinical history. Left basilar opacity could reflect atelectasis or infiltrate. No pneumothorax. No acute osseous abnormality.  IMPRESSION: 1. No evidence of a pulmonary vascular congestion or edema following hydration. 2. Right lower lobe opacity concerning for pneumonia given the clinical history. 3. Left lower lobe opacity may reflect  atelectasis or infiltrate. Atelectasis is favored.   Electronically Signed   By: Jacqulynn Cadet M.D.   On: 02/08/2014 11:24    Medications: I have reviewed the patient's current medications.  Assessment/Plan: SIRS - probable sepsis  Strep pneum ag negative - flu screen negative - Bilat Pneumonia- on chest xray- repeat some better Continue IV antibiotic for now. Blood culture negative  Hypotension  Better after fluids.- BP better- IVF- heplock  Lactic acidosis  Improved with hydration; in setting of infection  Thrombocytopenia  Worse- recheck- if still low- need Hematology consult  Stop Heparin - Antibody for heparin pending scd for DVT prophalyxis  Hypokalemia  Better- follow  Mild hyponatremia  Felt to be due to hypokalemia - improve  Transaminitis  Mild - likely low-grade shock liver - still elevated-  Nausea better - check u/s abdomen. OOB- ambulate  LOS: 3 days   Wenda Low 02/09/2014, 8:18 AM

## 2014-02-10 DIAGNOSIS — D696 Thrombocytopenia, unspecified: Secondary | ICD-10-CM | POA: Diagnosis not present

## 2014-02-10 DIAGNOSIS — J189 Pneumonia, unspecified organism: Secondary | ICD-10-CM | POA: Diagnosis not present

## 2014-02-10 DIAGNOSIS — R7989 Other specified abnormal findings of blood chemistry: Secondary | ICD-10-CM | POA: Diagnosis not present

## 2014-02-10 DIAGNOSIS — R945 Abnormal results of liver function studies: Secondary | ICD-10-CM

## 2014-02-10 LAB — CBC
HCT: 38.1 % — ABNORMAL LOW (ref 39.0–52.0)
Hemoglobin: 12.9 g/dL — ABNORMAL LOW (ref 13.0–17.0)
MCH: 30.1 pg (ref 26.0–34.0)
MCHC: 33.9 g/dL (ref 30.0–36.0)
MCV: 89 fL (ref 78.0–100.0)
PLATELETS: 60 10*3/uL — AB (ref 150–400)
RBC: 4.28 MIL/uL (ref 4.22–5.81)
RDW: 13.4 % (ref 11.5–15.5)
WBC: 4.4 10*3/uL (ref 4.0–10.5)

## 2014-02-10 LAB — COMPREHENSIVE METABOLIC PANEL
ALBUMIN: 3 g/dL — AB (ref 3.5–5.2)
ALK PHOS: 218 U/L — AB (ref 39–117)
ALT: 79 U/L — AB (ref 0–53)
AST: 65 U/L — AB (ref 0–37)
BUN: 11 mg/dL (ref 6–23)
CALCIUM: 8.6 mg/dL (ref 8.4–10.5)
CO2: 23 mEq/L (ref 19–32)
Chloride: 105 mEq/L (ref 96–112)
Creatinine, Ser: 0.76 mg/dL (ref 0.50–1.35)
GFR calc Af Amer: >90 mL/min (ref >90)
GFR calc non Af Amer: >90 mL/min (ref >90)
GLUCOSE: 100 mg/dL — AB (ref 70–99)
Potassium: 3.8 mEq/L (ref 3.7–5.3)
SODIUM: 142 meq/L (ref 137–147)
TOTAL PROTEIN: 6.4 g/dL (ref 6.0–8.3)
Total Bilirubin: 0.7 mg/dL (ref 0.3–1.2)

## 2014-02-10 MED ORDER — CEFUROXIME AXETIL 500 MG PO TABS: 500.0000 mg | ORAL_TABLET | Freq: Two times a day (BID) | ORAL | Status: DC

## 2014-02-10 NOTE — Plan of Care (Signed)
Problem: Consults Goal: General Medical Patient Education See Patient Education Module for specific education.  Outcome: Not Applicable Date Met:  97/47/18 PCP will continue care upon discharge home.

## 2014-02-10 NOTE — Care Management Note (Signed)
    Page 1 of 1   02/10/2014     5:39:27 PM CARE MANAGEMENT NOTE 02/10/2014  Patient:  Geoffrey Duran, Geoffrey Duran   Account Number:  [id number]  Date Initiated:  02/10/2014  Documentation initiated by:  Tomi Bamberger  Subjective/Objective Assessment:   dx pna  admit- lives with spouse.     Action/Plan:   Anticipated DC Date:  02/10/2014   Anticipated DC Plan:  [place]  CM consult      Choice offered to / List presented to:             Status of service:  Completed, signed off Medicare Important Message given?   (If response is "NO", the following Medicare IM given date fields will be blank) Date Medicare IM given:   Date Additional Medicare IM given:    Discharge Disposition:  HOME/SELF CARE  Per UR Regulation:  Reviewed for med. necessity/level of care/duration of stay  If discussed at [place] of Stay Meetings, dates discussed:    Comments:

## 2014-02-10 NOTE — Discharge Summary (Signed)
Physician Discharge Summary  NAME:Geoffrey Duran  XYI:[id number]  DOB: March 27, 1946   Admit date: 02/06/2014 Discharge date: 02/10/2014  Admitting Diagnosis: hypotension and thrombocytopenia.   Discharge Diagnoses:  Active Hospital Problems   Diagnosis Date Noted  . Pneumonia 02/06/2014  . Elevated LFTs 02/10/2014  . Thrombocytopenia 02/06/2014  . High anion gap metabolic acidosis 27/04/8674  . Sepsis 02/06/2014    Resolved Hospital Problems   Diagnosis Date Noted Date Resolved  . Fever and chills 02/06/2014 02/10/2014  . Cough 02/06/2014 02/10/2014  . Hypokalemia 02/06/2014 02/10/2014    Things to follow up in the outpatient setting: Recheck CXR, plt count, LFTs  Hospital Course: Patient was admitted with fever, chills, and hypotension with lactic acidosis worrisome for sepsis. CXR c/w pneumonia. This was on the setting of a resp illness treated with Levaquin over the 2 weeks PTA. He was treated with IVF, azithro and ceftriaxone. He improved quickly with improvement in his fever, cough, and BP. He was initially in SDU but was transferred to floor. He ambulated without difficulty. He is sent home to finish his abx orally.   An US showed liver cyts and some prominence of portal triads but no real hepatocellular disease.   Discharge Condition: improved  Consults: none  Disposition: Final discharge disposition not confirmed  Discharge Orders   Future Orders Complete By Expires   Diet - low sodium heart healthy  As directed    Increase activity slowly  As directed        Medication List    STOP taking these medications       levofloxacin 750 MG tablet  Commonly known as:  LEVAQUIN      TAKE these medications       cefUROXime 500 MG tablet  Commonly known as:  CEFTIN  Take 1 tablet (500 mg total) by mouth 2 (two) times daily with a meal.     CHERATUSSIN AC PO  Take 10 mLs by mouth every 6 (six) hours as needed. cough     metoprolol succinate 25 MG 24 hr tablet   Commonly known as:  TOPROL-XL  Take 25 mg by mouth daily.     omeprazole 20 MG capsule  Commonly known as:  PRILOSEC  Take 20 mg by mouth daily.         Time coordinating discharge: 40 minutes including medication reconciliation, transmission of prescriptions to pharmacy, preparation of discharge papers, and discussion with patient and family    Signed: Horton Finer 02/10/2014, 7:34 AM

## 2014-02-10 NOTE — Plan of Care (Signed)
Problem: Discharge Progression Outcomes Goal: Tolerating diet Outcome: Completed/Met Date Met:  02/10/14 Patient staying on clear diet due to comfort reasons.

## 2014-02-12 LAB — HEPARIN INDUCED THROMBOCYTOPENIA PNL
HEPARIN INDUCED PLT AB: POSITIVE
Patient O.D.: 0.725
UFH High Dose UFH H: 0 % Release
UFH Low Dose 0.1 IU/mL: 0 % Release
UFH Low Dose 0.5 IU/mL: 0 % Release
UFH SRA Result: NEGATIVE

## 2014-02-12 LAB — CULTURE, BLOOD (ROUTINE X 2)
Culture: NO GROWTH
Culture: NO GROWTH

## 2014-02-14 ENCOUNTER — Ambulatory Visit
Admission: RE | Admit: 2014-02-14 | Discharge: 2014-02-14 | Disposition: A | Discharge status: Home / Self Care | Payer: Medicare Other | Source: Ambulatory Visit | Attending: Internal Medicine | Admitting: Internal Medicine

## 2014-02-14 ENCOUNTER — Other Ambulatory Visit: Payer: Self-pay | Admitting: Internal Medicine

## 2014-02-14 DIAGNOSIS — J189 Pneumonia, unspecified organism: Secondary | ICD-10-CM | POA: Diagnosis not present

## 2014-02-14 DIAGNOSIS — D75829 Heparin-induced thrombocytopenia, unspecified: Secondary | ICD-10-CM | POA: Diagnosis not present

## 2014-02-14 DIAGNOSIS — J984 Other disorders of lung: Secondary | ICD-10-CM | POA: Diagnosis not present

## 2014-02-14 DIAGNOSIS — D7582 Heparin induced thrombocytopenia (HIT): Secondary | ICD-10-CM | POA: Diagnosis not present

## 2014-02-14 DIAGNOSIS — I779 Disorder of arteries and arterioles, unspecified: Secondary | ICD-10-CM | POA: Diagnosis not present

## 2014-07-29 ENCOUNTER — Telehealth: Payer: Self-pay

## 2014-07-29 NOTE — Telephone Encounter (Signed)
Okay to refill until appointment with Dr. Marlou Porch.

## 2014-07-30 ENCOUNTER — Other Ambulatory Visit: Payer: Self-pay

## 2014-07-30 MED ORDER — METOPROLOL SUCCINATE ER 25 MG PO TB24: 25.0000 mg | ORAL_TABLET | Freq: Every day | ORAL | Status: DC

## 2014-08-18 ENCOUNTER — Ambulatory Visit (INDEPENDENT_AMBULATORY_CARE_PROVIDER_SITE_OTHER): Payer: Medicare Other | Admitting: Cardiology

## 2014-08-18 ENCOUNTER — Encounter: Payer: Self-pay | Admitting: Cardiology

## 2014-08-18 VITALS — BP 118/82 | HR 84 | Ht 72.0 in | Wt 196.0 lb

## 2014-08-18 DIAGNOSIS — R5383 Other fatigue: Secondary | ICD-10-CM | POA: Diagnosis not present

## 2014-08-18 DIAGNOSIS — I493 Ventricular premature depolarization: Secondary | ICD-10-CM

## 2014-08-18 DIAGNOSIS — Z23 Encounter for immunization: Secondary | ICD-10-CM

## 2014-08-18 NOTE — Patient Instructions (Signed)
The current medical regimen is effective;  continue present plan and medications.  Follow up in 1 year with Dr Skains.  You will receive a letter in the mail 2 months before you are due.  Please call us when you receive this letter to schedule your follow up appointment.  

## 2014-08-18 NOTE — Progress Notes (Signed)
[facility]. [address] [place], [place]  [address] Phone: [phone] Fax:  [phone]  Date:  08/18/2014   ID:  Geoffrey Duran, DOB 1946/06/02, MRN [id number]  PCP:  Irven Shelling, MD   History of Present Illness: Geoffrey Duran is a 68 y.o. male here for follow-up of tachycardia. Previously placed on low-dose metoprolol after event monitor demonstrated frequent PVCs.  Family history of WPW syndrome as well as sudden death. No syncope but does feel fatigued during episodes previously of tachycardia. This would go on all day. He had the sensation of fast heart rate, missing a few beats then normal lasting approximately 1 minute. He would feel it in the front of his throat. No seen to be with exertion. It used to happen 2-3 times a day.  No smoking, no alcohol. LDL usually between 120 and 140.  Nuclear stress test 2011-low risk, no ischemia.  EKG 01/15/13 shows frequent PVCs, ventricular trigeminy pattern.  Enjoys RV.  He has been recently feeling periods of flushed, tiredness, will take a 30 minute nap and then feel better, associated arthralgias, occasional cold feeling in his hands, hands will turn white. He does not think that his flushing is associated with palpitations or tachycardia arrhythmia. He is concerned because this is similar to how he felt prior to his sepsis episode earlier in 2015 in the hospital.   Wt Readings from Last 3 Encounters:  08/18/14 196 lb (88.905 kg)  02/10/14 185 lb 8 oz (84.142 kg)     Past Medical History  Diagnosis Date  . Heart flutter, ventricular     Past Surgical History  Procedure Laterality Date  . Bilateral rotator surgery      Current Outpatient Prescriptions  Medication Sig Dispense Refill  . metoprolol succinate (TOPROL-XL) 25 MG 24 hr tablet Take 1 tablet (25 mg total) by mouth daily.  30 tablet  3   No current facility-administered medications for this visit.    Allergies:   No Known Allergies  Social  History:  The patient  reports that he quit smoking about 46 years ago. He does not have any smokeless tobacco history on file. He reports that he does not drink alcohol.   Family History  Problem Relation Age of Onset  . Sudden death Mother     Ventricular Fibrillation    ROS:  Please see the history of present illness.   No syncope, no bleeding, no orthopnea, no PND  All other systems reviewed and negative.   PHYSICAL EXAM: VS:  BP 118/82  Pulse 84  Ht 6' (1.829 m)  Wt 196 lb (88.905 kg)  BMI 26.58 kg/m2 Well nourished, well developed, in no acute distress HEENT: normal, [place]/AT, EOMI Neck: no JVD, normal carotid upstroke, no bruit Cardiac:  normal S1, S2; RRR; no murmur Lungs:  clear to auscultation bilaterally, no wheezing, rhonchi or rales Abd: soft, nontender, no hepatomegaly, no bruits Ext: no edema, 2+ distal pulses Skin: warm and dry GU: deferred Neuro: no focal abnormalities noted, AAO x 3  EKG:   No EKG today  ASSESSMENT AND PLAN:  1. Frequent PVCs-metoprolol. Continue. No changes made. 2. Transient fatigue-cold hands, flushing. I offered Holter monitor to exclude possible cardiac arrhythmia. At this point he would like to wait this out. I wonder if there is an autoimmune phenomenon going on with his transient arthralgias, flushing, cold hands/white appearance to hands transiently, almost Raynaud's. He will be discussing this further with Dr. Laurann Montana on  his first visit with him. 3. We will see him back in one year.  Signed, Candee Furbish, MD [facility]  08/18/2014 3:02 PM

## 2014-09-05 DIAGNOSIS — J069 Acute upper respiratory infection, unspecified: Secondary | ICD-10-CM | POA: Diagnosis not present

## 2014-09-17 DIAGNOSIS — Z0000 Encounter for general adult medical examination without abnormal findings: Secondary | ICD-10-CM | POA: Diagnosis not present

## 2014-09-17 DIAGNOSIS — K219 Gastro-esophageal reflux disease without esophagitis: Secondary | ICD-10-CM | POA: Diagnosis not present

## 2014-09-17 DIAGNOSIS — Z23 Encounter for immunization: Secondary | ICD-10-CM | POA: Diagnosis not present

## 2014-09-17 DIAGNOSIS — Z1389 Encounter for screening for other disorder: Secondary | ICD-10-CM | POA: Diagnosis not present

## 2014-09-17 DIAGNOSIS — E780 Pure hypercholesterolemia: Secondary | ICD-10-CM | POA: Diagnosis not present

## 2014-09-30 DIAGNOSIS — M5134 Other intervertebral disc degeneration, thoracic region: Secondary | ICD-10-CM | POA: Diagnosis not present

## 2014-09-30 DIAGNOSIS — M5030 Other cervical disc degeneration, unspecified cervical region: Secondary | ICD-10-CM | POA: Diagnosis not present

## 2014-09-30 DIAGNOSIS — M609 Myositis, unspecified: Secondary | ICD-10-CM | POA: Diagnosis not present

## 2014-09-30 DIAGNOSIS — M542 Cervicalgia: Secondary | ICD-10-CM | POA: Diagnosis not present

## 2014-09-30 DIAGNOSIS — M9901 Segmental and somatic dysfunction of cervical region: Secondary | ICD-10-CM | POA: Diagnosis not present

## 2014-09-30 DIAGNOSIS — M5127 Other intervertebral disc displacement, lumbosacral region: Secondary | ICD-10-CM | POA: Diagnosis not present

## 2014-09-30 DIAGNOSIS — M5137 Other intervertebral disc degeneration, lumbosacral region: Secondary | ICD-10-CM | POA: Diagnosis not present

## 2014-09-30 DIAGNOSIS — R51 Headache: Secondary | ICD-10-CM | POA: Diagnosis not present

## 2014-09-30 DIAGNOSIS — M9903 Segmental and somatic dysfunction of lumbar region: Secondary | ICD-10-CM | POA: Diagnosis not present

## 2014-09-30 DIAGNOSIS — M9902 Segmental and somatic dysfunction of thoracic region: Secondary | ICD-10-CM | POA: Diagnosis not present

## 2014-10-01 DIAGNOSIS — R51 Headache: Secondary | ICD-10-CM | POA: Diagnosis not present

## 2014-10-01 DIAGNOSIS — M542 Cervicalgia: Secondary | ICD-10-CM | POA: Diagnosis not present

## 2014-10-01 DIAGNOSIS — M5137 Other intervertebral disc degeneration, lumbosacral region: Secondary | ICD-10-CM | POA: Diagnosis not present

## 2014-10-01 DIAGNOSIS — M9901 Segmental and somatic dysfunction of cervical region: Secondary | ICD-10-CM | POA: Diagnosis not present

## 2014-10-01 DIAGNOSIS — M9903 Segmental and somatic dysfunction of lumbar region: Secondary | ICD-10-CM | POA: Diagnosis not present

## 2014-10-06 DIAGNOSIS — M9903 Segmental and somatic dysfunction of lumbar region: Secondary | ICD-10-CM | POA: Diagnosis not present

## 2014-10-06 DIAGNOSIS — M542 Cervicalgia: Secondary | ICD-10-CM | POA: Diagnosis not present

## 2014-10-06 DIAGNOSIS — M5137 Other intervertebral disc degeneration, lumbosacral region: Secondary | ICD-10-CM | POA: Diagnosis not present

## 2014-10-06 DIAGNOSIS — R51 Headache: Secondary | ICD-10-CM | POA: Diagnosis not present

## 2014-10-09 DIAGNOSIS — R51 Headache: Secondary | ICD-10-CM | POA: Diagnosis not present

## 2014-10-09 DIAGNOSIS — M9903 Segmental and somatic dysfunction of lumbar region: Secondary | ICD-10-CM | POA: Diagnosis not present

## 2014-10-09 DIAGNOSIS — M5137 Other intervertebral disc degeneration, lumbosacral region: Secondary | ICD-10-CM | POA: Diagnosis not present

## 2014-10-09 DIAGNOSIS — M542 Cervicalgia: Secondary | ICD-10-CM | POA: Diagnosis not present

## 2014-10-14 DIAGNOSIS — M5137 Other intervertebral disc degeneration, lumbosacral region: Secondary | ICD-10-CM | POA: Diagnosis not present

## 2014-10-14 DIAGNOSIS — M542 Cervicalgia: Secondary | ICD-10-CM | POA: Diagnosis not present

## 2014-10-14 DIAGNOSIS — R51 Headache: Secondary | ICD-10-CM | POA: Diagnosis not present

## 2014-10-14 DIAGNOSIS — M9903 Segmental and somatic dysfunction of lumbar region: Secondary | ICD-10-CM | POA: Diagnosis not present

## 2014-10-14 DIAGNOSIS — M9902 Segmental and somatic dysfunction of thoracic region: Secondary | ICD-10-CM | POA: Diagnosis not present

## 2014-10-14 DIAGNOSIS — M546 Pain in thoracic spine: Secondary | ICD-10-CM | POA: Diagnosis not present

## 2015-01-23 ENCOUNTER — Other Ambulatory Visit: Payer: Self-pay | Admitting: Cardiology

## 2015-03-03 ENCOUNTER — Telehealth: Payer: Self-pay | Admitting: Cardiology

## 2015-03-03 NOTE — Telephone Encounter (Signed)
Pt reports for the past several days he has been experiencing dizziness when standing.  He has been checking his HR and BP with his BP monitor.  He reports his HR being in the 30's BP 117/70.  He usually takes his metoprolol at bedtime.  He will hold it tonight.  He is aware I will review with Dr Marlou Porch and call him back with further orders.

## 2015-03-03 NOTE — Telephone Encounter (Signed)
OK to hold Toprol. Prob HR of 30 from ventricular bigeminy. Candee Furbish, MD

## 2015-03-03 NOTE — Telephone Encounter (Signed)
Pt aware .  He will monitor his HR and BP and call back if further problems.

## 2015-03-03 NOTE — Telephone Encounter (Signed)
New message      Pt c/o medication issue:  1. Name of Medication:  toprol  2. How are you currently taking this medication (dosage and times per day)? 25mg  at night  3. Are you having a reaction (difficulty breathing--STAT)?  Not sure  4. What is your medication issue?  Pt states that he has been dizzy for the last 3-4 days.  His HR is 35 which is low for him.  He is so dizzy that he states he want to stop his toprol if possible. There is no specific time of the day that he is dizzy. Please call

## 2015-03-30 ENCOUNTER — Ambulatory Visit (INDEPENDENT_AMBULATORY_CARE_PROVIDER_SITE_OTHER): Payer: Medicare Other | Admitting: Cardiology

## 2015-03-30 ENCOUNTER — Telehealth: Payer: Self-pay | Admitting: Cardiology

## 2015-03-30 ENCOUNTER — Encounter: Payer: Self-pay | Admitting: Cardiology

## 2015-03-30 VITALS — BP 146/58 | HR 33 | Ht 72.0 in | Wt 195.4 lb

## 2015-03-30 DIAGNOSIS — I499 Cardiac arrhythmia, unspecified: Secondary | ICD-10-CM

## 2015-03-30 DIAGNOSIS — R0789 Other chest pain: Secondary | ICD-10-CM | POA: Diagnosis not present

## 2015-03-30 DIAGNOSIS — I493 Ventricular premature depolarization: Secondary | ICD-10-CM

## 2015-03-30 DIAGNOSIS — I498 Other specified cardiac arrhythmias: Secondary | ICD-10-CM | POA: Insufficient documentation

## 2015-03-30 DIAGNOSIS — R001 Bradycardia, unspecified: Secondary | ICD-10-CM | POA: Diagnosis not present

## 2015-03-30 MED ORDER — FLECAINIDE ACETATE 50 MG PO TABS: 50.0000 mg | ORAL_TABLET | Freq: Two times a day (BID) | ORAL | Status: DC

## 2015-03-30 NOTE — Progress Notes (Signed)
Geoffrey Duran. [address], [address] [place], [place]  [address] Phone: [phone] Fax:  [phone]  Date:  03/30/2015   ID:  Geoffrey Duran, DOB 05-14-1946, MRN [id number]  PCP:  Irven Shelling, MD   History of Present Illness: [name] is a 69 y.o. male here for follow-up of tachycardia. Previously placed on low-dose metoprolol after event monitor demonstrated frequent PVCs.  Family history of WPW syndrome as well as sudden death. No syncope but does feel fatigued during episodes previously of tachycardia. This would go on all day. He had the sensation of fast heart rate, missing a few beats then normal lasting approximately 1 minute. He would feel it in the front of his throat. No seen to be with exertion. It used to happen 2-3 times a day.  No smoking, no alcohol. LDL usually between 120 and 140.  Nuclear stress test 2011-low risk, no ischemia.  EKG 01/15/13 shows frequent PVCs, ventricular trigeminy pattern.  Enjoys RV.  He has been recently feeling periods of flushed, tiredness, will take a 30 minute nap and then feel better, associated arthralgias, occasional cold feeling in his hands, hands will turn white. He does not think that his flushing is associated with palpitations or tachycardia arrhythmia. He is concerned because this is similar to how he felt prior to his sepsis episode earlier in 2015 in the hospital.  03/30/15 - yesterday good afternoon and sat down and left chest pain, locked cramp, lasted 5 min severe and eased off. No nausea, bad indigestion past week. Eat corn flakes and tears him up. Pulse 33 here. Omron at home - error. Pulse 47 8pm. 150 SBP. Pulse 62 on repeat. This am 126, pulse 34. Feels lightheaded. When walking up hill feels SOB, feeling in his neck (question atrial contraction again closed AV valve).   No toprol for past month. Deer tick.    Wt Readings from Last 3 Encounters:  03/30/15 195 lb 6.4 oz (88.633 kg)  08/18/14 196 lb (88.905 kg)    02/10/14 185 lb 8 oz (84.142 kg)     Past Medical History  Diagnosis Date  . Heart flutter, ventricular     Past Surgical History  Procedure Laterality Date  . Bilateral rotator surgery      Current Outpatient Prescriptions  Medication Sig Dispense Refill  . metoprolol succinate (TOPROL-XL) 25 MG 24 hr tablet TAKE 1 TABLET BY MOUTH DAILY (Patient not taking: Reported on 03/30/2015) 30 tablet 5   No current facility-administered medications for this visit.    Allergies:   No Known Allergies  Social History:  The patient  reports that he quit smoking about 47 years ago. He has quit using smokeless tobacco. His smokeless tobacco use included Chew. He reports that he does not drink alcohol.   Family History  Problem Relation Age of Onset  . Sudden death Mother     Ventricular Fibrillation    ROS:  Please see the history of present illness.   No syncope, no bleeding, no orthopnea, no PND  All other systems reviewed and negative.   PHYSICAL EXAM: VS:  BP 146/58 mmHg  Pulse 33  Ht 6' (1.829 m)  Wt 195 lb 6.4 oz (88.633 kg)  BMI 26.50 kg/m2 Well nourished, well developed, in no acute distress HEENT: normal, [place]/AT, EOMI Neck: no JVD, normal carotid upstroke, no bruit Cardiac:  Bradycardic regular with occasional ectopy; no murmur Lungs:  clear to auscultation bilaterally, no wheezing, rhonchi or rales Abd:  soft, nontender, no hepatomegaly, no bruits Ext: no edema, 2+ distal pulses Skin: warm and dry GU: deferred Neuro: no focal abnormalities noted, AAO x 3  EKG:   Today 03/30/15-heart rate 33 bpm sinus rhythm with blocked PAC ( atrial bigeminy pattern.)  ASSESSMENT AND PLAN:  1. Bradycardia-sinus bradycardia with atrial bigeminy pattern. Heart rate 33 bpm.discussed with Dr. Thompson Grayer of electrophysiology. He recommended trying flecainide 50 mg twice a day. Hopefully this will help extinguish his frequent PAC which then will allow his natural pacemaker to increase his  effective heart rate. He does not have a history of coronary artery disease. His last stress test was low risk but no ischemia. No evidence of left ventricular hypertrophy. I discussed with him risks and benefits of this medication including dangerous arrhythmias. His wife was present. She will be checking his heart rate with Omron. 2. Frequent PVCs previous-now demonstrating PACs. 3. Transient fatigue-cold hands, flushing.this is likely secondary to transient bradycardia as was demonstrated today in clinic and previously demonstrated by his wife from blood pressure cuff at home, see prior telephone note. He does note frequent deer tick. Lyme disease can affect conduction system. He does not have an underlying first-degree AV block however. 4. Atypical chest pain-pinpoint left-sided chest pain described as a cramping sensation. No nausea, no associated shortness of breath. Prior stress test low risk. If symptoms continue or become more worrisome, further testing with stress test would be reasonable. For now, we will focus on significant bradycardia. 5. We will see him back on Friday check EKG since new start flecainide 50 mg twice a day. If PACs are still present on EKG then, increase to 100 mg twice a day. We will have to do an exercise treadmill test shortly after administrating flecainide. Dr. Rayann Heman will be seeing him soon in clinic as well. They will be contacting him.  Signed, Candee Furbish, MD [facility]  03/30/2015 11:03 AM

## 2015-03-30 NOTE — Telephone Encounter (Signed)
Calling stating he had episode yesterday as he describes as "severe" "cramping" in left chest lasting about 5 min as severe- but whole episode over 10 min. States he has had indigestion x 1 week; feeling lightheaded and absolutely no energy.  Denies nausea, vomiting or sweating.  Unable to get BP during episode yesterday but about 20 min after episode was 155/86 HR 47. Today still feels the "cramping" but not severe as yesterday.  States he stopped Metoprolol 03/03/15 due to low HR so is not on any medications.  Today BP 126/68 HR 32 and still feels lightheaded.  Spoke w/Dr. Marlou Porch who will see him this AM at 11:15.  Advised pt and he will be here as scheduled.

## 2015-03-30 NOTE — Telephone Encounter (Signed)
Pt c/o of Chest Pain: STAT if CP now or developed within 24 hours  1. Are you having CP right now? No  2. Are you experiencing any other symptoms (ex. SOB, nausea, vomiting, sweating)? No  3. How long have you been experiencing CP? Since yesterday  4. Is your CP continuous or coming and going? Coming and going  5. Have you taken Nitroglycerin? No   Pt calling stating that he had what he would describe as a heart cramping that lasted for about 15 minutes. Please call back and advise.

## 2015-03-30 NOTE — Patient Instructions (Signed)
Medication Instructions:  Please remain off Toprol. Start Flecainide 50 mg twice a day.  Follow-Up: Please follow up in 1 week with a NP/PA.  You will need a follow up EKG.  Thank you for choosing [facility]!!

## 2015-04-03 ENCOUNTER — Ambulatory Visit (INDEPENDENT_AMBULATORY_CARE_PROVIDER_SITE_OTHER): Payer: Medicare Other

## 2015-04-03 VITALS — BP 128/88 | HR 62 | Resp 12

## 2015-04-03 DIAGNOSIS — R001 Bradycardia, unspecified: Secondary | ICD-10-CM

## 2015-04-03 NOTE — Patient Instructions (Signed)
Your physician recommends that you keep your scheduled follow-up appointment with Dr Rayann Heman on 04/08/15 at 10:15.  Your physician recommends that you continue on your current medications as directed. Please refer to the Current Medication list given to you today.

## 2015-04-03 NOTE — Progress Notes (Signed)
The pt came into the office today for EKG and this shows Normal Sinus Rhythm rate of 62.  The pt is feeling better and has more energy at this time.  I also performed a rhythm strip and there was no evidence of PACs or PVCs. The only thing the pt has noticed over the past few days is that he has had a dull headache.  The pt's BP at home has been running 136-157/72-88. I made him aware that his headache could be from BP being slightly elevated. The pt will keep his scheduled appointment with Dr Rayann Heman on 04/08/15.

## 2015-04-04 NOTE — Progress Notes (Signed)
Great. Flecainide 50 BID seems to be working.  Candee Furbish, MD

## 2015-04-08 ENCOUNTER — Encounter: Payer: Self-pay | Admitting: Internal Medicine

## 2015-04-08 ENCOUNTER — Ambulatory Visit (INDEPENDENT_AMBULATORY_CARE_PROVIDER_SITE_OTHER): Payer: Medicare Other | Admitting: Internal Medicine

## 2015-04-08 VITALS — BP 150/90 | HR 67 | Ht 72.0 in | Wt 195.2 lb

## 2015-04-08 DIAGNOSIS — I499 Cardiac arrhythmia, unspecified: Secondary | ICD-10-CM

## 2015-04-08 DIAGNOSIS — H903 Sensorineural hearing loss, bilateral: Secondary | ICD-10-CM | POA: Diagnosis not present

## 2015-04-08 DIAGNOSIS — H6063 Unspecified chronic otitis externa, bilateral: Secondary | ICD-10-CM | POA: Diagnosis not present

## 2015-04-08 DIAGNOSIS — I493 Ventricular premature depolarization: Secondary | ICD-10-CM | POA: Diagnosis not present

## 2015-04-08 DIAGNOSIS — H7313 Chronic myringitis, bilateral: Secondary | ICD-10-CM | POA: Diagnosis not present

## 2015-04-08 DIAGNOSIS — H9313 Tinnitus, bilateral: Secondary | ICD-10-CM | POA: Diagnosis not present

## 2015-04-08 DIAGNOSIS — I10 Essential (primary) hypertension: Secondary | ICD-10-CM | POA: Diagnosis not present

## 2015-04-08 DIAGNOSIS — I498 Other specified cardiac arrhythmias: Secondary | ICD-10-CM

## 2015-04-08 MED ORDER — FLECAINIDE ACETATE 50 MG PO TABS: 25.0000 mg | ORAL_TABLET | Freq: Two times a day (BID) | ORAL | Status: DC

## 2015-04-08 NOTE — Progress Notes (Signed)
Electrophysiology Office Note   Date:  04/08/2015   ID:  Geoffrey Duran, DOB 16-Jun-1946, MRN [id number]  PCP:  Irven Shelling, MD  Cardiologist:  Dr Marlou Porch Primary Electrophysiologist: Thompson Grayer, MD    Chief Complaint  Patient presents with  . Bradycardia  . Atrial bigeminy     History of Present Illness: Geoffrey Duran is a 69 y.o. male who presents today for electrophysiology evaluation.   The patient recently presented to Dr Marlou Porch (pt reviewed with Dr Marlou Porch) with symptoms of fatigue and decreased exercise tolerance.  EKg at that time revealed pacs in bigeminy which did not conduct to the ventricle thus causing a prolonged RR interval and symptomatic bradycardia.  He was placed on flecainide and has done much better since that time.  His symptoms of bradycardia have resolved.  He has noticed a slight headache which he thinks could be due to flecainide.  Today, he denies symptoms of palpitations, chest pain, shortness of breath, orthopnea, PND, lower extremity edema, claudication, dizziness, presyncope, syncope, bleeding, or neurologic sequela. The patient is tolerating medications without difficulties and is otherwise without complaint today.    Past Medical History  Diagnosis Date  . Premature atrial contraction   . Hypertension    Past Surgical History  Procedure Laterality Date  . Bilateral rotator surgery       Current Outpatient Prescriptions  Medication Sig Dispense Refill  . flecainide (TAMBOCOR) 50 MG tablet Take 0.5 tablets (25 mg total) by mouth 2 (two) times daily. 90 tablet 3   No current facility-administered medications for this visit.    Allergies:   Heparin   Social History:  The patient  reports that he quit smoking about 47 years ago. He has quit using smokeless tobacco. His smokeless tobacco use included Chew. He reports that he does not drink alcohol or use illicit drugs.   Family History:  The patient's family history includes Sudden death in  his mother.  No other FH of sudden death or arrhythmias   ROS:  Please see the history of present illness.   All other systems are reviewed and negative.    PHYSICAL EXAM: VS:  BP 150/90 mmHg  Pulse 67  Ht 6' (1.829 m)  Wt 88.542 kg (195 lb 3.2 oz)  BMI 26.47 kg/m2 , BMI Body mass index is 26.47 kg/(m^2). GEN: Well nourished, well developed, in no acute distress HEENT: normal Neck: no JVD, carotid bruits, or masses Cardiac: RRR; no murmurs, rubs, or gallops,no edema  Respiratory:  clear to auscultation bilaterally, normal work of breathing GI: soft, nontender, nondistended, + BS MS: no deformity or atrophy Skin: warm and dry  Neuro:  Strength and sensation are intact Psych: euthymic mood, full affect  EKG:  EKG is ordered today. The ekg ordered today shows sinus rhythm, normal ekg   Recent Labs: No results found for requested labs within last 365 days.    Lipid Panel  No results found for: CHOL, TRIG, HDL, CHOLHDL, VLDL, LDLCALC, LDLDIRECT   Wt Readings from Last 3 Encounters:  04/08/15 88.542 kg (195 lb 3.2 oz)  03/30/15 88.633 kg (195 lb 6.4 oz)  08/18/14 88.905 kg (196 lb)      Other studies Reviewed: Additional studies/ records that were reviewed today include: Dr Marlou Porch notes   ASSESSMENT AND PLAN:  1.  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 Therapeutic strategies for symptomatic pacs including medicine and ablation  were discussed in detail with the patient today. Risk, benefits, and alternatives to EP study and radiofrequency ablation were also discussed in detail today.  At this time, he is clear that he would like to avoid ablation.  Given headache, he would like to try to reduce flecainide to 25mg  BID.  If his headache resolves and his PACs are controlled then this would be a good long term strategy.  Alternatives would be propafenone or multaq (though these may be more likely to  potentiate bradycardia).  For this reason, I would prefer to try to keep him on flecainide going forward.  He may benefit from GXT once on a stable regimen.  2. HTN New diagnosis Pt is reluctant to make too many changes at once.  I have therefore not made any changes today 2 gram sodium diet is encouraged  Follow-up with Dr Marlou Porch in 4-6 weeks I am happy to see as needed going forward  Current medicines are reviewed at length with the patient today.   The patient does not have concerns regarding his medicines.  The following changes were made today:  none  Labs/ tests ordered today include:  Orders Placed This Encounter  Procedures  . EKG 12-Lead     Signed, Thompson Grayer, MD  04/08/2015 10:48 PM     [address] [place] [address] [phone] (office) [phone] (fax)

## 2015-04-08 NOTE — Patient Instructions (Addendum)
Medication Instructions:  Your physician has recommended you make the following change in your medication:  1) Decrease Flecainide to 25 mg twice daily   Labwork: None ordered  Testing/Procedures: None ordered  Follow-Up: Your physician recommends that you schedule a follow-up appointment in: 2 weeks with Dr Kingsley Plan--- Ludwig Clarks 04/24/15 2:30pm for 2:45 appointment    Any Other Special Instructions Will Be Listed Below (If Applicable).

## 2015-04-24 ENCOUNTER — Ambulatory Visit (INDEPENDENT_AMBULATORY_CARE_PROVIDER_SITE_OTHER): Payer: Medicare Other | Admitting: Cardiology

## 2015-04-24 ENCOUNTER — Encounter: Payer: Self-pay | Admitting: Cardiology

## 2015-04-24 VITALS — BP 130/68 | HR 64 | Ht 72.0 in | Wt 194.0 lb

## 2015-04-24 DIAGNOSIS — R001 Bradycardia, unspecified: Secondary | ICD-10-CM

## 2015-04-24 DIAGNOSIS — I493 Ventricular premature depolarization: Secondary | ICD-10-CM

## 2015-04-24 DIAGNOSIS — I499 Cardiac arrhythmia, unspecified: Secondary | ICD-10-CM

## 2015-04-24 DIAGNOSIS — I498 Other specified cardiac arrhythmias: Secondary | ICD-10-CM

## 2015-04-24 MED ORDER — FLECAINIDE ACETATE 50 MG PO TABS: 25.0000 mg | ORAL_TABLET | Freq: Two times a day (BID) | ORAL | Status: DC

## 2015-04-24 NOTE — Progress Notes (Signed)
[facility]. [address], [address] [place], [place]  [address] Phone: [phone] Fax:  [phone]  Date:  04/24/2015   ID:  Geoffrey Duran, DOB 1946/07/08, MRN [id number]  PCP:  Irven Shelling, MD   History of Present Illness: Geoffrey Duran is a 69 y.o. Duran here for follow-up of tachycardia. Previously placed on low-dose metoprolol after event monitor demonstrated frequent PVCs.  A total flecainide 50 mg twice a day, headache. Decrease it to 25 twice a day. On 50 twice a day he was having no PVCs, currently showing ventricular trigeminy. He is however quite pleased with this. He saw Dr. Rayann Heman in consultation.  Family history of WPW syndrome as well as sudden death. No syncope but does feel fatigued during episodes previously of tachycardia. This would go on all day. He had the sensation of fast heart rate, missing a few beats then normal lasting approximately 1 minute. He would feel it in the front of his throat. No seen to be with exertion. It used to happen 2-3 times a day.  No smoking, no alcohol. LDL usually between 120 and 140.  Nuclear stress test 2011-low risk, no ischemia.  EKG 01/15/13 shows frequent PVCs, ventricular trigeminy pattern.  Enjoys RV.  He has been recently feeling periods of flushed, tiredness, will take a 30 minute nap and then feel better, associated arthralgias, occasional cold feeling in his hands, hands will turn white. He does not think that his flushing is associated with palpitations or tachycardia arrhythmia. He is concerned because this is similar to how he felt prior to his sepsis episode earlier in 2015 in the hospital.  03/30/15 - yesterday good afternoon and sat down and left chest pain, locked cramp, lasted 5 min severe and eased off. No nausea, bad indigestion past week. Eat corn flakes and tears him up. Pulse 33 here. Omron at home - error. Pulse 47 8pm. 150 SBP. Pulse 62 on repeat. This am 126, pulse 34. Feels lightheaded. When walking up hill  feels SOB, feeling in his neck (question atrial contraction again closed AV valve).   No toprol for past month. Deer tick.    Wt Readings from Last 3 Encounters:  04/24/15 194 lb (87.998 kg)  04/08/15 195 lb 3.2 oz (88.542 kg)  03/30/15 195 lb 6.4 oz (88.633 kg)     Past Medical History  Diagnosis Date  . Premature atrial contraction   . Hypertension     Past Surgical History  Procedure Laterality Date  . Bilateral rotator surgery      Current Outpatient Prescriptions  Medication Sig Dispense Refill  . flecainide (TAMBOCOR) 50 MG tablet Take 0.5 tablets (25 mg total) by mouth 2 (two) times daily. 90 tablet 3   No current facility-administered medications for this visit.    Allergies:    Allergies  Allergen Reactions  . Heparin     Vascular issues    Social History:  The patient  reports that he quit smoking about 47 years ago. He has quit using smokeless tobacco. His smokeless tobacco use included Chew. He reports that he does not drink alcohol or use illicit drugs.   Family History  Problem Relation Age of Onset  . Sudden death Mother     Ventricular Fibrillation at age 56  . Heart attack Neg Hx   . Stroke Neg Hx     ROS:  Please see the history of present illness.   No syncope, no bleeding, no orthopnea, no PND  All other systems reviewed and negative.   PHYSICAL EXAM: VS:  BP 130/68 mmHg  Pulse 64  Ht 6' (1.829 m)  Wt 194 lb (87.998 kg)  BMI 26.31 kg/m2 Well nourished, well developed, in no acute distress HEENT: normal, [place]/AT, EOMI Neck: no JVD, normal carotid upstroke, no bruit Cardiac:  Bradycardic regular with occasional ectopy; no murmur Lungs:  clear to auscultation bilaterally, no wheezing, rhonchi or rales Abd: soft, nontender, no hepatomegaly, no bruits Ext: no edema, 2+ distal pulses Skin: warm and dry GU: deferred Neuro: no focal abnormalities noted, AAO x 3  EKG:   Today 03/30/15-heart rate 33 bpm sinus rhythm with blocked PAC ( atrial  bigeminy pattern.)  ASSESSMENT AND PLAN:  1. Bradycardia-sinus bradycardia with atrial bigeminy pattern. Heart rate 33 bpm.discussed with Dr. Thompson Grayer of electrophysiology. He recommended trying flecainide 50 mg twice a day. This did a good job with frequent PAC which then will allow his natural pacemaker to increase his effective heart rate. However he did have a headache and he decided to decrease his flecainide to 25 mg twice a day. Most recent EKG shows ventricular trigeminy. Ablation may be an option in the future if his symptoms become worse. He is quite pleased with the way he is feeling currently. He does not have a history of coronary artery disease. His last stress test was low risk but no ischemia. No evidence of left ventricular hypertrophy. I discussed with him risks and benefits of this medication including dangerous arrhythmias. His wife was present. She will be checking his heart rate with Omron. 2. Frequent PVCs previous-now demonstrating PACs. 3. Transient fatigue-cold hands, flushing.this is likely secondary to transient bradycardia as was demonstrated today in clinic and previously demonstrated by his wife from blood pressure cuff at home, see prior telephone note. He does note frequent deer tick. Lyme disease can affect conduction system. He does not have an underlying first-degree AV block however. 4. Atypical chest pain-pinpoint left-sided chest pain described as a cramping sensation. No nausea, no associated shortness of breath. Prior stress test low risk. If symptoms continue or become more worrisome, further testing with stress test would be reasonable. For now, we will focus on significant bradycardia. 5. Four-month follow-up  Signed, Candee Furbish, MD [facility]  04/24/2015 2:44 PM

## 2015-04-24 NOTE — Addendum Note (Signed)
Addended by: Freada Bergeron on: 04/24/2015 05:07 PM   Modules accepted: Orders

## 2015-04-24 NOTE — Patient Instructions (Signed)
Medication Instructions:  A REFILL HAS BEEN SENT IN FOR FLECAINIDE   Labwork: NONE  Testing/Procedures: NONE  Follow-Up: 4 MONTH FOLLOW UP WITH DR. Marlou Porch  Any Other Special Instructions Will Be Listed Below (If Applicable).

## 2015-06-15 DIAGNOSIS — H2513 Age-related nuclear cataract, bilateral: Secondary | ICD-10-CM | POA: Diagnosis not present

## 2015-06-15 DIAGNOSIS — H1789 Other corneal scars and opacities: Secondary | ICD-10-CM | POA: Diagnosis not present

## 2015-06-15 DIAGNOSIS — H25011 Cortical age-related cataract, right eye: Secondary | ICD-10-CM | POA: Diagnosis not present

## 2015-06-15 DIAGNOSIS — G43909 Migraine, unspecified, not intractable, without status migrainosus: Secondary | ICD-10-CM | POA: Diagnosis not present

## 2015-08-28 ENCOUNTER — Other Ambulatory Visit: Payer: Self-pay | Admitting: Internal Medicine

## 2015-08-28 DIAGNOSIS — Z23 Encounter for immunization: Secondary | ICD-10-CM | POA: Diagnosis not present

## 2015-08-28 DIAGNOSIS — K219 Gastro-esophageal reflux disease without esophagitis: Secondary | ICD-10-CM | POA: Diagnosis not present

## 2015-08-31 ENCOUNTER — Ambulatory Visit (INDEPENDENT_AMBULATORY_CARE_PROVIDER_SITE_OTHER): Payer: Medicare Other | Admitting: Cardiology

## 2015-08-31 ENCOUNTER — Encounter: Payer: Self-pay | Admitting: Cardiology

## 2015-08-31 VITALS — BP 134/80 | HR 69 | Ht 72.0 in | Wt 194.1 lb

## 2015-08-31 DIAGNOSIS — R001 Bradycardia, unspecified: Secondary | ICD-10-CM

## 2015-08-31 DIAGNOSIS — I499 Cardiac arrhythmia, unspecified: Secondary | ICD-10-CM | POA: Diagnosis not present

## 2015-08-31 DIAGNOSIS — I498 Other specified cardiac arrhythmias: Secondary | ICD-10-CM

## 2015-08-31 DIAGNOSIS — I10 Essential (primary) hypertension: Secondary | ICD-10-CM

## 2015-08-31 NOTE — Patient Instructions (Signed)

## 2015-08-31 NOTE — Progress Notes (Signed)
[name]. [address], [address] [place], [place]  [address] Phone: [phone] Fax:  [phone]  Date:  08/31/2015   ID:  Geoffrey Duran, DOB 03/08/69/1947, MRN [id number]  PCP:  Irven Shelling, MD   History of Present Illness: Geoffrey Duran is a 69 y.o. male here for follow-up of tachycardia. Previously placed on low-dose metoprolol after event monitor demonstrated frequent PVCs. However, metoprolol was stopped because of significant bradycardia.  A total flecainide 50 mg twice a day, headache. Decrease it to 25 twice a day. On 50 twice a day he was having no PVCs, currently showing ventricular trigeminy. He is however quite pleased with this. He saw Dr. Rayann Heman in consultation.  Family history of WPW syndrome as well as sudden death. No syncope but does feel fatigued during episodes previously of tachycardia. This would go on all day. He had the sensation of fast heart rate, missing a few beats then normal lasting approximately 1 minute. He would feel it in the front of his throat. No seen to be with exertion. It used to happen 2-3 times a day.  No smoking, no alcohol. LDL usually between 120 and 140.  Nuclear stress test 2011-low risk, no ischemia.  EKG 01/15/13 shows frequent PVCs, ventricular trigeminy pattern.  Enjoys RV. 24 foot [name]  He has been recently feeling periods of flushed, tiredness, will take a 30 minute nap and then feel better, associated arthralgias, occasional cold feeling in his hands, hands will turn white. He does not think that his flushing is associated with palpitations or tachycardia arrhythmia. He is concerned because this is similar to how he felt prior to his sepsis episode earlier in 2015 in the hospital.  He does not take any beta blocker because of previous bradycardia. Overall doing well.  His wife has a left bundle branch block, prior stress test low risk.   Wt Readings from Last 3 Encounters:  08/31/15 194 lb 1.9 oz (88.052 kg)  04/24/15  194 lb (87.998 kg)  04/08/15 195 lb 3.2 oz (88.542 kg)     Past Medical History  Diagnosis Date  . Premature atrial contraction   . Hypertension     Past Surgical History  Procedure Laterality Date  . Bilateral rotator surgery      Current Outpatient Prescriptions  Medication Sig Dispense Refill  . flecainide (TAMBOCOR) 50 MG tablet Take 0.5 tablets (25 mg total) by mouth 2 (two) times daily. May take extra 25 mg or 50 mg for break through palpitations 60 tablet 11  . pantoprazole (PROTONIX) 40 MG tablet TK 1 T PO BID 30 MIN B BRE AND DINNER  5   No current facility-administered medications for this visit.    Allergies:    Allergies  Allergen Reactions  . Heparin     Vascular issues    Social History:  The patient  reports that he quit smoking about 47 years ago. He has quit using smokeless tobacco. His smokeless tobacco use included Chew. He reports that he does not drink alcohol or use illicit drugs.   Family History  Problem Relation Age of Onset  . Sudden death Mother     Ventricular Fibrillation at age 75  . Heart attack Neg Hx   . Stroke Neg Hx     ROS:  Please see the history of present illness.   No syncope, no bleeding, no orthopnea, no PND  All other systems reviewed and negative.   PHYSICAL EXAM: VS:  BP  134/80 mmHg  Pulse 69  Ht 6' (1.829 m)  Wt 194 lb 1.9 oz (88.052 kg)  BMI 26.32 kg/m2  SpO2 99% Well nourished, well developed, in no acute distress HEENT: normal, [place]/AT, EOMI Neck: no JVD, normal carotid upstroke, no bruit Cardiac:  Regular, normal rate with no ectopy; no murmur Lungs:  clear to auscultation bilaterally, no wheezing, rhonchi or rales Abd: soft, nontender, no hepatomegaly, no bruits Ext: no edema, 2+ distal pulses Skin: warm and dry GU: deferred Neuro: no focal abnormalities noted, AAO x 3  EKG:   03/30/15-heart rate 33 bpm sinus rhythm with blocked PAC ( atrial bigeminy pattern.)  ASSESSMENT AND PLAN:  1. Bradycardia-pulse  currently much better. Previously sinus bradycardia with atrial bigeminy pattern. Heart rate 33 bpm.discussed with Dr. Thompson Grayer of electrophysiology. He recommended trying flecainide 50 mg twice a day. This did a good job with frequent PAC which then will allow his natural pacemaker to increase his effective heart rate. However he did have a headache and he decided to decrease his flecainide to 25 mg twice a day. Most recent EKG shows ventricular trigeminy. Ablation may be an option in the future if his symptoms become worse. He is quite pleased with the way he is feeling currently. He does not have a history of coronary artery disease. His last stress test was low risk but no ischemia. No evidence of left ventricular hypertrophy. I discussed with him risks and benefits of this medication including dangerous arrhythmias. His wife was present. He will be checking his heart rate with Omron. 2. Frequent PVCs previous-now  PACs. Flecainide helping with this. 3. Prior Atypical chest pain-pinpoint left-sided chest pain described as a cramping sensation. Resolved. No nausea, no associated shortness of breath. Prior stress test low risk. If symptoms continue or become more worrisome, further testing with stress test would be reasonable. 4. Six-month follow-up  Signed, Candee Furbish, MD [facility]  08/31/2015 9:57 AM

## 2015-09-01 ENCOUNTER — Ambulatory Visit
Admission: RE | Admit: 2015-09-01 | Discharge: 2015-09-01 | Disposition: A | Discharge status: Home / Self Care | Payer: Medicare Other | Source: Ambulatory Visit | Attending: Internal Medicine | Admitting: Internal Medicine

## 2015-09-01 DIAGNOSIS — K219 Gastro-esophageal reflux disease without esophagitis: Secondary | ICD-10-CM

## 2015-09-01 DIAGNOSIS — R1110 Vomiting, unspecified: Secondary | ICD-10-CM | POA: Diagnosis not present

## 2015-09-21 DIAGNOSIS — K219 Gastro-esophageal reflux disease without esophagitis: Secondary | ICD-10-CM | POA: Diagnosis not present

## 2015-09-21 DIAGNOSIS — R030 Elevated blood-pressure reading, without diagnosis of hypertension: Secondary | ICD-10-CM | POA: Diagnosis not present

## 2015-09-21 DIAGNOSIS — Z1389 Encounter for screening for other disorder: Secondary | ICD-10-CM | POA: Diagnosis not present

## 2015-11-23 DIAGNOSIS — H6063 Unspecified chronic otitis externa, bilateral: Secondary | ICD-10-CM | POA: Diagnosis not present

## 2015-11-23 DIAGNOSIS — H9313 Tinnitus, bilateral: Secondary | ICD-10-CM | POA: Diagnosis not present

## 2015-11-23 DIAGNOSIS — H7313 Chronic myringitis, bilateral: Secondary | ICD-10-CM | POA: Diagnosis not present

## 2015-11-23 DIAGNOSIS — H903 Sensorineural hearing loss, bilateral: Secondary | ICD-10-CM | POA: Diagnosis not present

## 2016-01-13 DIAGNOSIS — H903 Sensorineural hearing loss, bilateral: Secondary | ICD-10-CM | POA: Diagnosis not present

## 2016-01-13 DIAGNOSIS — H9313 Tinnitus, bilateral: Secondary | ICD-10-CM | POA: Diagnosis not present

## 2016-02-18 ENCOUNTER — Other Ambulatory Visit: Payer: Self-pay | Admitting: Gastroenterology

## 2016-02-23 ENCOUNTER — Encounter: Payer: Self-pay | Admitting: Cardiology

## 2016-02-23 ENCOUNTER — Ambulatory Visit (INDEPENDENT_AMBULATORY_CARE_PROVIDER_SITE_OTHER): Payer: Medicare Other | Admitting: Cardiology

## 2016-02-23 VITALS — BP 126/78 | HR 51 | Ht 72.0 in | Wt 198.0 lb

## 2016-02-23 DIAGNOSIS — I10 Essential (primary) hypertension: Secondary | ICD-10-CM | POA: Diagnosis not present

## 2016-02-23 DIAGNOSIS — I493 Ventricular premature depolarization: Secondary | ICD-10-CM | POA: Diagnosis not present

## 2016-02-23 NOTE — Patient Instructions (Signed)

## 2016-02-23 NOTE — Progress Notes (Signed)
[facility]. [address] [place], [place]  [address] Phone: [phone] Fax:  [phone]  Date:  02/23/2016   ID:  Geoffrey Duran, DOB 1946-05-09, MRN [id number]  PCP:  Irven Shelling, MD   History of Present Illness: Geoffrey Duran is a 70 y.o. male here for follow-up of tachycardia. Previously placed on low-dose metoprolol after event monitor demonstrated frequent PVCs. However, metoprolol was stopped because of significant bradycardia.  A total flecainide 50 mg twice a day, headache. Decrease it to 25 twice a day. On 50 twice a day he was having no PVCs, currently showing ventricular trigeminy. He is however quite pleased with this. He saw Dr. Rayann Heman in consultation.  Family history of WPW syndrome as well as sudden death. No syncope but does feel fatigued during episodes previously of tachycardia. This would go on all day. He had the sensation of fast heart rate, missing a few beats then normal lasting approximately 1 minute. He would feel it in the front of his throat. No seen to be with exertion. It used to happen 2-3 times a day.  No smoking, no alcohol. LDL usually between 120 and 140.  Nuclear stress test 2011-low risk, no ischemia.  EKG 01/15/13 shows frequent PVCs, ventricular trigeminy pattern.  Enjoys RV. 24 foot [name]  He has been recently feeling periods of flushed, tiredness, will take a 30 minute nap and then feel better, associated arthralgias, occasional cold feeling in his hands, hands will turn white. He does not think that his flushing is associated with palpitations or tachycardia arrhythmia. He is concerned because this is similar to how he felt prior to his sepsis episode earlier in 2015 in the hospital.  He does not take any beta blocker because of previous bradycardia. Overall doing well.  His wife has a left bundle branch block, prior stress test low risk.  02/23/16-has not needed his medications recently. Doing well. No chest pain, no syncope, no  palpitations. Enjoying RV. [name] into town. Has a new Media planner.    Wt Readings from Last 3 Encounters:  02/23/16 198 lb (89.812 kg)  08/31/15 194 lb 1.9 oz (88.052 kg)  04/24/15 194 lb (87.998 kg)     Past Medical History  Diagnosis Date  . Premature atrial contraction   . Hypertension     Past Surgical History  Procedure Laterality Date  . Bilateral rotator surgery      Current Outpatient Prescriptions  Medication Sig Dispense Refill  . flecainide (TAMBOCOR) 50 MG tablet Take 50 mg by mouth 2 (two) times daily as needed (palpitations).    . pantoprazole (PROTONIX) 40 MG tablet take one tablet daily as needed for acid reflux  5   No current facility-administered medications for this visit.    Allergies:    Allergies  Allergen Reactions  . Heparin     Vascular issues    Social History:  The patient  reports that he quit smoking about 48 years ago. He has quit using smokeless tobacco. His smokeless tobacco use included Chew. He reports that he does not drink alcohol or use illicit drugs.   Family History  Problem Relation Age of Onset  . Sudden death Mother     Ventricular Fibrillation at age 15  . Heart attack Neg Hx   . Stroke Neg Hx     ROS:  Please see the history of present illness.   No syncope, no bleeding, no orthopnea, no PND  All other systems reviewed and negative.   PHYSICAL EXAM: VS:  BP 126/78 mmHg  Pulse 51  Ht 6' (1.829 m)  Wt 198 lb (89.812 kg)  BMI 26.85 kg/m2 Well nourished, well developed, in no acute distress HEENT: normal, [place]/AT, EOMI Neck: no JVD, normal carotid upstroke, no bruit Cardiac:  Regular, normal rate with no ectopy; no murmur Lungs:  clear to auscultation bilaterally, no wheezing, rhonchi or rales Abd: soft, nontender, no hepatomegaly, no bruits Ext: no edema, 2+ distal pulses Skin: warm and dry GU: deferred Neuro: no focal abnormalities noted, AAO x 3  EKG:   EKG was ordered today.  02/23/16-sinus bradycardia rate 52 with no other abnormality. Normal QRS duration of 94 ms. No PVCs. Personally viewed-03/30/15-heart rate 33 bpm sinus rhythm with blocked PAC ( atrial bigeminy pattern.)  ASSESSMENT AND PLAN:  Bradycardia -pulse currently much better. He has not needed any flecainide. Has not felt any PVCs.  -Previously sinus bradycardia with atrial bigeminy pattern. Heart rate 33 bpm.discussed with Dr. Thompson Grayer of electrophysiology. He recommended trying flecainide 50 mg twice a day. This did a good job with frequent PAC which then will allow his natural pacemaker to increase his effective heart rate. However he did have a headache and he decided to decrease his flecainide to 25 mg twice a day.  Ablation may be an option in the future if his symptoms become worse. He is quite pleased with the way he is feeling currently. He does not have a history of coronary artery disease. His last stress test was low risk but no ischemia. No evidence of left ventricular hypertrophy. I discussed with him risks and benefits of this medication including dangerous arrhythmias. His wife was present. He will be checking his heart rate with Omron.  Frequent PVCs  Previous now  PACs. Flecainide helping with this on an as-needed basis. Has not needed any medication recently. Doing well.  Prior Atypical chest pain -pinpoint left-sided chest pain described as a cramping sensation. Resolved. No nausea, no associated shortness of breath. Prior stress test low risk. If symptoms continue or become more worrisome, further testing with stress test would be reasonable.  22-month follow-up  Signed, Candee Furbish, MD [facility]  02/23/2016 11:25 AM

## 2016-03-01 ENCOUNTER — Encounter (HOSPITAL_COMMUNITY): Payer: Self-pay | Admitting: *Deleted

## 2016-03-08 ENCOUNTER — Encounter (HOSPITAL_COMMUNITY): Payer: Self-pay | Admitting: Anesthesiology

## 2016-03-08 ENCOUNTER — Encounter (HOSPITAL_COMMUNITY)
Admission: RE | Disposition: A | Discharge status: Home / Self Care | Payer: Self-pay | Source: Ambulatory Visit | Attending: Gastroenterology

## 2016-03-08 ENCOUNTER — Ambulatory Visit (HOSPITAL_COMMUNITY)
Admission: RE | Admit: 2016-03-08 | Discharge: 2016-03-08 | Disposition: A | Discharge status: Home / Self Care | Payer: Medicare Other | Source: Ambulatory Visit | Attending: Gastroenterology | Admitting: Gastroenterology

## 2016-03-08 ENCOUNTER — Ambulatory Visit (HOSPITAL_COMMUNITY): Payer: Medicare Other | Admitting: Anesthesiology

## 2016-03-08 DIAGNOSIS — Z87891 Personal history of nicotine dependence: Secondary | ICD-10-CM | POA: Diagnosis not present

## 2016-03-08 DIAGNOSIS — K635 Polyp of colon: Secondary | ICD-10-CM | POA: Insufficient documentation

## 2016-03-08 DIAGNOSIS — Z1211 Encounter for screening for malignant neoplasm of colon: Secondary | ICD-10-CM | POA: Diagnosis not present

## 2016-03-08 DIAGNOSIS — K219 Gastro-esophageal reflux disease without esophagitis: Secondary | ICD-10-CM | POA: Insufficient documentation

## 2016-03-08 DIAGNOSIS — E7800 Pure hypercholesterolemia, unspecified: Secondary | ICD-10-CM | POA: Insufficient documentation

## 2016-03-08 DIAGNOSIS — Z79899 Other long term (current) drug therapy: Secondary | ICD-10-CM | POA: Diagnosis not present

## 2016-03-08 DIAGNOSIS — I456 Pre-excitation syndrome: Secondary | ICD-10-CM | POA: Insufficient documentation

## 2016-03-08 DIAGNOSIS — Z86010 Personal history of colonic polyps: Secondary | ICD-10-CM | POA: Insufficient documentation

## 2016-03-08 DIAGNOSIS — I10 Essential (primary) hypertension: Secondary | ICD-10-CM | POA: Diagnosis not present

## 2016-03-08 DIAGNOSIS — D122 Benign neoplasm of ascending colon: Secondary | ICD-10-CM | POA: Insufficient documentation

## 2016-03-08 DIAGNOSIS — D124 Benign neoplasm of descending colon: Secondary | ICD-10-CM | POA: Diagnosis not present

## 2016-03-08 HISTORY — DX: Gastro-esophageal reflux disease without esophagitis: K21.9

## 2016-03-08 HISTORY — PX: COLONOSCOPY WITH PROPOFOL: SHX5780

## 2016-03-08 HISTORY — DX: Cardiac arrhythmia, unspecified: I49.9

## 2016-03-08 SURGERY — COLONOSCOPY WITH PROPOFOL
Anesthesia: Monitor Anesthesia Care

## 2016-03-08 MED ORDER — LIDOCAINE HCL (CARDIAC) 20 MG/ML IV SOLN
INTRAVENOUS | Status: DC | PRN
  Administered 2016-03-08: 75 mg via INTRAVENOUS

## 2016-03-08 MED ORDER — LACTATED RINGERS IV SOLN
INTRAVENOUS | Status: DC
  Administered 2016-03-08: 13:00:00 via INTRAVENOUS

## 2016-03-08 MED ORDER — PROPOFOL 10 MG/ML IV BOLUS
INTRAVENOUS | Status: DC | PRN
  Administered 2016-03-08 (×2): 20 mg via INTRAVENOUS

## 2016-03-08 MED ORDER — PROPOFOL 500 MG/50ML IV EMUL
INTRAVENOUS | Status: DC | PRN
  Administered 2016-03-08: 140 ug/kg/min via INTRAVENOUS

## 2016-03-08 MED ORDER — LIDOCAINE HCL (CARDIAC) 20 MG/ML IV SOLN
INTRAVENOUS | Status: AC
  Filled 2016-03-08: qty 5

## 2016-03-08 MED ORDER — PROPOFOL 10 MG/ML IV BOLUS
INTRAVENOUS | Status: AC
  Filled 2016-03-08: qty 40

## 2016-03-08 SURGICAL SUPPLY — 22 items

## 2016-03-08 NOTE — H&P (Signed)
  Procedure: Surveillance colonoscopy. 08/24/2010 colonoscopy was performed with removal of two small transverse colon adenomatous polyps  History: The patient is a 70 year old male born 11-Aug-1946. He is scheduled to undergo a surveillance colonoscopy today.  Past medical history: Right inguinal herniorrhaphy. Bilateral shoulder surgery. Wolff-Parkinson-White syndrome. Hypercholesterolemia. Heparin-induced thrombocytopenia. Gastroesophageal reflux.  Allergies: Heparin-induced thrombocytopenia  Exam: The patient is alert and lying comfortably on the endoscopy stretcher. Abdomen is soft and nontender to palpation. Lungs are clear to auscultation. Cardiac exam reveals a regular rhythm.  Plan: Proceed with surveillance colonoscopy

## 2016-03-08 NOTE — Anesthesia Postprocedure Evaluation (Signed)
Anesthesia Post Note  Patient: Geoffrey Duran  Procedure(s) Performed: Procedure(s) (LRB): COLONOSCOPY WITH PROPOFOL (N/A)  Patient location during evaluation: PACU Anesthesia Type: MAC Level of consciousness: awake and alert Pain management: pain level controlled Vital Signs Assessment: post-procedure vital signs reviewed and stable Respiratory status: spontaneous breathing, nonlabored ventilation, respiratory function stable and patient connected to nasal cannula oxygen Cardiovascular status: stable and blood pressure returned to baseline Anesthetic complications: no    Last Vitals:  Filed Vitals:   03/08/16 1420 03/08/16 1430  BP: 132/85 138/82  Pulse: 56 55  Temp:    Resp: 16 16    Last Pain: There were no vitals filed for this visit.               Penny Frisbie J

## 2016-03-08 NOTE — Op Note (Signed)
[facility] Patient Name: Geoffrey Duran Procedure Date: 03/08/2016 MRN: [id number] Attending MD: Garlan Fair , MD Date of Birth: 04/23/1946 CSN: [id number] Age: 70 Admit Type: Outpatient Procedure:                Colonoscopy Indications:              High risk colon cancer surveillance: Personal                            history of adenoma less than 10 mm in size Providers:                Garlan Fair, MD, Hilma Favors, RN, Zenon Mayo, RN, William Dalton, Technician Referring MD:              Medicines:                Propofol per Anesthesia Complications:            No immediate complications. Estimated Blood Loss:     Estimated blood loss: none. Procedure:                Pre-Anesthesia Assessment:                           - Prior to the procedure, a History and Physical                            was performed, and patient medications and                            allergies were reviewed. The patient's tolerance of                            previous anesthesia was also reviewed. The risks                            and benefits of the procedure and the sedation                            options and risks were discussed with the patient.                            All questions were answered, and informed consent                            was obtained. Prior Anticoagulants: The patient has                            taken no previous anticoagulant or antiplatelet                            agents. ASA Grade Assessment: II - A patient with  mild systemic disease. After reviewing the risks                            and benefits, the patient was deemed in                            satisfactory condition to undergo the procedure.                           After obtaining informed consent, the colonoscope                            was passed under direct vision. Throughout the       procedure, the patient's blood pressure, pulse, and                            oxygen saturations were monitored continuously. The                            [id number] scope was introduced through                            the anus and advanced to the the cecum, identified                            by appendiceal orifice and ileocecal valve. The                            colonoscopy was performed without difficulty. The                            patient tolerated the procedure well. The quality                            of the bowel preparation was good. The appendiceal                            orifice and the rectum were photographed. Scope In: 1:32:13 PM Scope Out: 1:59:10 PM Total Procedure Duration: 0 hours 26 minutes 57 seconds  Findings:      The perianal and digital rectal examinations were normal.      A 5 mm polyp was found in the distal ascending colon. The polyp was       sessile. The polyp was removed with a cold snare. Resection and       retrieval were complete.      A 5 mm polyp was found in the mid descending colon. The polyp was       sessile. The polyp was removed with a cold snare. Resection and       retrieval were complete.      The exam was otherwise without abnormality. Impression:               - One 5 mm polyp in the distal ascending colon,  removed with a cold snare. Resected and retrieved.                           - One 5 mm polyp in the mid descending colon,                            removed with a cold snare. Resected and retrieved.                           - The examination was otherwise normal. Moderate Sedation:      N/A- Per Anesthesia Care Recommendation:           - Patient has a contact number available for                            emergencies. The signs and symptoms of potential                            delayed complications were discussed with the                            patient. Return to  normal activities tomorrow.                            Written discharge instructions were provided to the                            patient.                           - Repeat colonoscopy is not recommended for                            surveillance.                           - Resume previous diet.                           - Continue present medications. Procedure Code(s):        --- Professional ---                           [id number], Colonoscopy, flexible; with removal of                            tumor(s), polyp(s), or other lesion(s) by snare                            technique Diagnosis Code(s):        --- Professional ---                           Z86.010, Personal history of colonic polyps  D12.2, Benign neoplasm of ascending colon                           D12.4, Benign neoplasm of descending colon CPT copyright 2016 American Medical Association. All rights reserved. The codes documented in this report are preliminary and upon coder review may  be revised to meet current compliance requirements. Earle Gell, MD Garlan Fair, MD 03/08/2016 2:05:28 PM This report has been signed electronically. Number of Addenda: 0

## 2016-03-08 NOTE — Transfer of Care (Signed)
Immediate Anesthesia Transfer of Care Note  Patient: Geoffrey Duran  Procedure(s) Performed: Procedure(s): COLONOSCOPY WITH PROPOFOL (N/A)  Patient Location: Endoscopy Unit  Anesthesia Type:MAC  Level of Consciousness: awake  Airway & Oxygen Therapy: Patient Spontanous Breathing and Patient connected to face mask oxygen  Post-op Assessment: Report given to RN and Post -op Vital signs reviewed and stable  Post vital signs: Reviewed and stable  Last Vitals:  Filed Vitals:   03/08/16 1245  BP: 170/88  Pulse: 57  Temp: 36.6 C  Resp: 17    Last Pain: There were no vitals filed for this visit.       Complications: No apparent anesthesia complications

## 2016-03-08 NOTE — Discharge Instructions (Signed)

## 2016-03-08 NOTE — Anesthesia Preprocedure Evaluation (Addendum)
Anesthesia Evaluation  Patient identified by MRN, date of birth, ID band Patient awake    Reviewed: Allergy & Precautions, NPO status , Patient's Chart, lab work & pertinent test results  Airway Mallampati: II  TM Distance: >3 FB Neck ROM: Full    Dental no notable dental hx.    Pulmonary pneumonia, former smoker,    Pulmonary exam normal breath sounds clear to auscultation       Cardiovascular hypertension, Normal cardiovascular exam+ dysrhythmias  Rhythm:Regular Rate:Normal     Neuro/Psych negative neurological ROS  negative psych ROS   GI/Hepatic Neg liver ROS, GERD  Medicated,  Endo/Other  negative endocrine ROS  Renal/GU negative Renal ROS  negative genitourinary   Musculoskeletal negative musculoskeletal ROS (+)   Abdominal   Peds negative pediatric ROS (+)  Hematology negative hematology ROS (+)   Anesthesia Other Findings   Reproductive/Obstetrics negative OB ROS                             Anesthesia Physical Anesthesia Plan  ASA: II  Anesthesia Plan: MAC   Post-op Pain Management:    Induction: Intravenous  Airway Management Planned: Natural Airway  Additional Equipment:   Intra-op Plan:   Post-operative Plan:   Informed Consent: I have reviewed the patients History and Physical, chart, labs and discussed the procedure including the risks, benefits and alternatives for the proposed anesthesia with the patient or authorized representative who has indicated his/her understanding and acceptance.   Dental advisory given  Plan Discussed with: CRNA  Anesthesia Plan Comments:         Anesthesia Quick Evaluation

## 2016-03-09 ENCOUNTER — Encounter (HOSPITAL_COMMUNITY): Payer: Self-pay | Admitting: Gastroenterology

## 2016-03-22 ENCOUNTER — Ambulatory Visit
Admission: RE | Admit: 2016-03-22 | Discharge: 2016-03-22 | Disposition: A | Discharge status: Home / Self Care | Payer: Medicare Other | Source: Ambulatory Visit | Attending: Internal Medicine | Admitting: Internal Medicine

## 2016-03-22 ENCOUNTER — Other Ambulatory Visit: Payer: Self-pay | Admitting: Internal Medicine

## 2016-03-22 DIAGNOSIS — S42032A Displaced fracture of lateral end of left clavicle, initial encounter for closed fracture: Secondary | ICD-10-CM | POA: Diagnosis not present

## 2016-03-22 DIAGNOSIS — M25512 Pain in left shoulder: Secondary | ICD-10-CM

## 2016-03-22 DIAGNOSIS — D1801 Hemangioma of skin and subcutaneous tissue: Secondary | ICD-10-CM | POA: Diagnosis not present

## 2016-03-22 DIAGNOSIS — L821 Other seborrheic keratosis: Secondary | ICD-10-CM | POA: Diagnosis not present

## 2016-03-22 DIAGNOSIS — D225 Melanocytic nevi of trunk: Secondary | ICD-10-CM | POA: Diagnosis not present

## 2016-03-22 DIAGNOSIS — L570 Actinic keratosis: Secondary | ICD-10-CM | POA: Diagnosis not present

## 2016-09-22 DIAGNOSIS — Z1389 Encounter for screening for other disorder: Secondary | ICD-10-CM | POA: Diagnosis not present

## 2016-09-22 DIAGNOSIS — Z0000 Encounter for general adult medical examination without abnormal findings: Secondary | ICD-10-CM | POA: Diagnosis not present

## 2016-09-22 DIAGNOSIS — M778 Other enthesopathies, not elsewhere classified: Secondary | ICD-10-CM | POA: Diagnosis not present

## 2016-11-29 DIAGNOSIS — J209 Acute bronchitis, unspecified: Secondary | ICD-10-CM | POA: Diagnosis not present

## 2016-11-29 DIAGNOSIS — K219 Gastro-esophageal reflux disease without esophagitis: Secondary | ICD-10-CM | POA: Diagnosis not present

## 2016-11-29 DIAGNOSIS — R030 Elevated blood-pressure reading, without diagnosis of hypertension: Secondary | ICD-10-CM | POA: Diagnosis not present

## 2017-02-22 DIAGNOSIS — H524 Presbyopia: Secondary | ICD-10-CM | POA: Diagnosis not present

## 2017-02-22 DIAGNOSIS — H5203 Hypermetropia, bilateral: Secondary | ICD-10-CM | POA: Diagnosis not present

## 2017-02-22 DIAGNOSIS — H2513 Age-related nuclear cataract, bilateral: Secondary | ICD-10-CM | POA: Diagnosis not present

## 2017-03-06 ENCOUNTER — Encounter: Payer: Self-pay | Admitting: Cardiology

## 2017-03-06 ENCOUNTER — Encounter (INDEPENDENT_AMBULATORY_CARE_PROVIDER_SITE_OTHER): Payer: Self-pay

## 2017-03-06 ENCOUNTER — Ambulatory Visit (INDEPENDENT_AMBULATORY_CARE_PROVIDER_SITE_OTHER): Payer: Medicare Other | Admitting: Cardiology

## 2017-03-06 VITALS — BP 144/94 | HR 57 | Ht 72.0 in | Wt 197.4 lb

## 2017-03-06 DIAGNOSIS — R0789 Other chest pain: Secondary | ICD-10-CM | POA: Diagnosis not present

## 2017-03-06 DIAGNOSIS — I10 Essential (primary) hypertension: Secondary | ICD-10-CM | POA: Diagnosis not present

## 2017-03-06 DIAGNOSIS — I493 Ventricular premature depolarization: Secondary | ICD-10-CM | POA: Diagnosis not present

## 2017-03-06 NOTE — Patient Instructions (Signed)
Medication Instructions:  The current medical regimen is effective;  continue present plan and medications.  Follow-Up: Follow up as needed with Dr Skains.  Thank you for choosing [facility]!!     

## 2017-03-06 NOTE — Progress Notes (Signed)
[place]. [address], [address], [place]  [address] Phone: [phone] Fax:  [phone]  Date:  03/06/2017   ID:  Geoffrey Duran, DOB 20-Jan-1946, MRN [id number]  PCP:  Lavone Orn, MD   History of Present Illness: Geoffrey Duran is a 71 y.o. male here for follow-up of PVCs, on rare low-dose when necessary 25 mg flecainide. Previously placed on low-dose metoprolol after event monitor demonstrated frequent PVCs. However, metoprolol was stopped because of significant bradycardia.  Previously on Flecainide 50 mg twice a day, headache side effect so decrease it to 25 twice a day and now he has only taken it perhaps once in the past 365 days.Marland Kitchen He is however quite pleased with this. He saw Dr. Rayann Heman previously in consultation.  Family history of WPW syndrome as well as sudden death. No syncope. No smoking, no alcohol. LDL usually between 120 and 140.  Nuclear stress test 2011-low risk, no ischemia.  EKG 01/15/13 shows frequent PVCs, ventricular trigeminy pattern. Current EKG does not show any premature beats.  Enjoys RV. [place]  Previously had been recently feeling periods of flushed, tiredness, will take a 30 minute nap and then feel better, associated arthralgias, occasional cold feeling in his hands, hands will turn white. He does not think that his flushing is associated with palpitations or tachycardia arrhythmia. He is concerned because this is similar to how he felt prior to his sepsis episode earlier in 2015 in the hospital.  He does not take any beta blocker because of previous bradycardia. Overall doing well.  His wife has a left bundle branch block, prior stress test low risk.  02/23/16-has not needed his medications recently. Doing well. No chest pain, no syncope, no palpitations. Enjoying RV. [place]. [place] into town. Has a new Media planner.   03/06/17-overall doing quite well. Occasional cough. No syncope, no bleeding, no orthopnea, no PND. He  only used 125 mg dose of flecainide in the past year. Overall he is been doing excellently. He had his RV painted.   Wt Readings from Last 3 Encounters:  03/06/17 197 lb 6.4 oz (89.5 kg)  03/08/16 190 lb (86.2 kg)  02/23/16 198 lb (89.8 kg)     Past Medical History:  Diagnosis Date  . Dysrhythmia    Tachycardia- Dr. Marlou Porch- follows- Flecanide as needed for palpitations  . GERD (gastroesophageal reflux disease)   . Hypertension   . Premature atrial contraction     Past Surgical History:  Procedure Laterality Date  . bilateral rotator surgery    . COLONOSCOPY WITH PROPOFOL N/A 03/08/2016   Procedure: COLONOSCOPY WITH PROPOFOL;  Surgeon: Garlan Fair, MD;  Location: [facility];  Service: Endoscopy;  Laterality: N/A;  . HERNIA REPAIR    . TONSILLECTOMY      Current Outpatient Prescriptions  Medication Sig Dispense Refill  . flecainide (TAMBOCOR) 50 MG tablet Take 25 mg by mouth 2 (two) times daily as needed (For palpitations.).    Marland Kitchen Magnesium Chloride (MAGNESIUM DR PO) Take 1 tablet by mouth daily as needed (For poor circulation in hands.).     Marland Kitchen Multiple Vitamin (MULTIVITAMIN WITH MINERALS) TABS tablet Take 1 tablet by mouth daily.    . pantoprazole (PROTONIX) 40 MG tablet Take 40 mg by mouth 2 (two) times daily as needed (For heartburn or acid reflux.).     No current facility-administered medications for this visit.     Allergies:    Allergies  Allergen  Reactions  . Heparin Other (See Comments)    Vascular issues    Social History:  The patient  reports that he quit smoking about 49 years ago. He has quit using smokeless tobacco. His smokeless tobacco use included Chew. He reports that he does not drink alcohol or use drugs.   Family History  Problem Relation Age of Onset  . Sudden death Mother        Ventricular Fibrillation at age 23  . Heart attack Neg Hx   . Stroke Neg Hx     ROS:  Please see the history of present illness. Overall doing well, unless  specified above all other review of systems negative.  PHYSICAL EXAM: VS:  BP (!) 144/94 (BP Location: Left Arm, Patient Position: Sitting, Cuff Size: Normal)   Pulse (!) 57   Ht 6' (1.829 m)   Wt 197 lb 6.4 oz (89.5 kg)   SpO2 97%   BMI 26.77 kg/m  GEN: Well nourished, well developed, in no acute distress  HEENT: normal  Neck: no JVD, carotid bruits, or masses Cardiac: RRR; no murmurs, rubs, or gallops, no edema  Respiratory:  clear to auscultation bilaterally, normal work of breathing GI: soft, nontender, nondistended, + BS MS: no deformity or atrophy  Skin: warm and dry, no rash Neuro:  Alert and Oriented x 3, Strength and sensation are intact Psych: euthymic mood, full affect   EKG:   EKG was ordered today. 03/06/17-sinus bradycardia rate 57 normal QRS complex 94 ms, no other abnormalities. Personally viewed 02/23/16-sinus bradycardia rate 52 with no other abnormality. Normal QRS duration of 94 ms. No PVCs. Personally viewed-03/30/15-heart rate 33 bpm sinus rhythm with blocked PAC ( atrial bigeminy pattern.)  ASSESSMENT AND PLAN:  PVCs  - Previously frequent. No longer noted on EKG. He is only taken 1 dose of flecainide 25 mg in the past year.  - No need for further evaluation. Saw [facility] in the past.  Essential hypertension  - Mildly elevated today but previously under good control. He will continue to monitor at home.   Prior bradycardia  - Not on metoprolol.  Prior atypical chest pain  - He feels as those a cramp. When he stretches back and takes deep breath it goes away.  - Prior stress test reassuring.  When necessary follow-up   Signed, Candee Furbish, MD [facility]  03/06/2017 8:52 AM

## 2017-04-12 IMAGING — RF DG ESOPHAGUS
14 series · 14 of 14 positions shown · non-contrast
Comparison: None.

CLINICAL DATA: Gastroesophageal reflux, vomiting

EXAM:
ESOPHOGRAM / BARIUM SWALLOW / BARIUM TABLET STUDY
TECHNIQUE: Combined double contrast and single contrast examination performed
using effervescent crystals, thick barium liquid, and thin barium
liquid. The patient was observed with fluoroscopy swallowing a 13 mm
barium sulphate tablet.
FLUOROSCOPY TIME:  Radiation Exposure Index (as provided by the
fluoroscopic device): 41 d Gy cm2

[Series 1: run · 1 of 1 slices shown (1 of 14)]
[im 1/1]
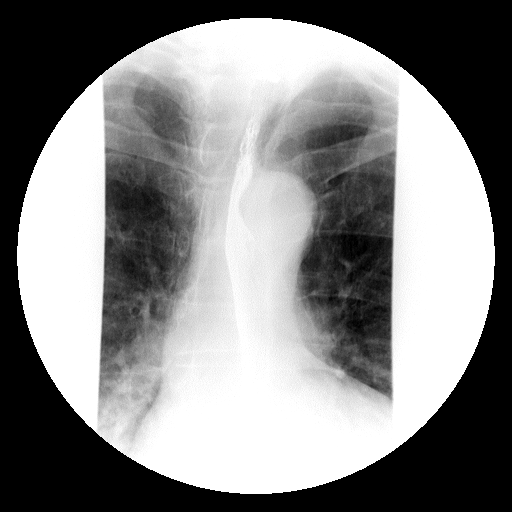

[Series 2: run · 1 of 1 slices shown (2 of 14)]
[im 1/1]
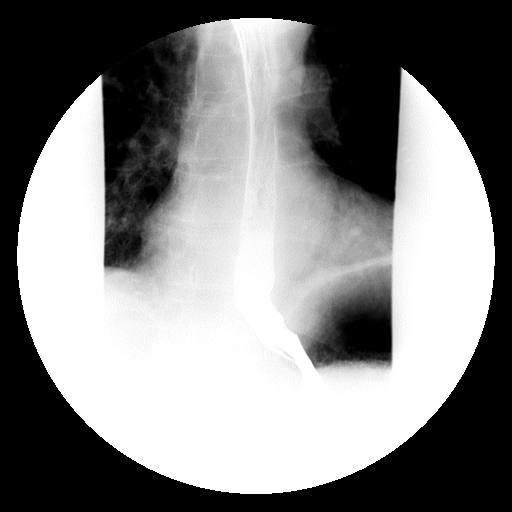

[Series 3: run · 1 of 1 slices shown (3 of 14)]
[im 1/1]
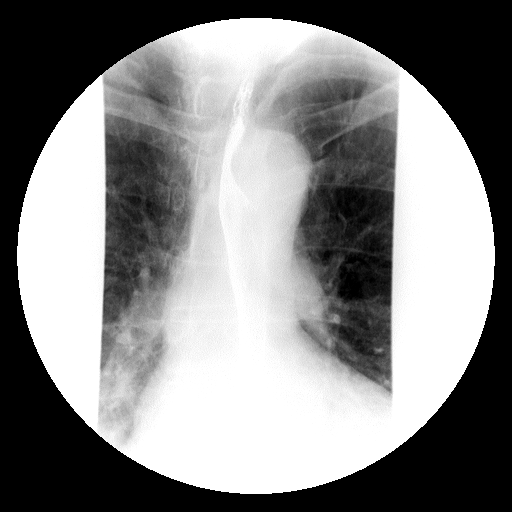

[Series 4: run · 1 of 1 slices shown (4 of 14)]
[im 1/1]
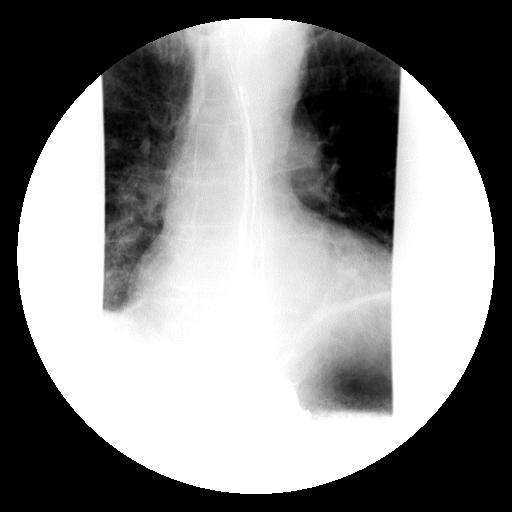

[Series 5: run · 1 of 1 slices shown (5 of 14)]
[im 1/1]
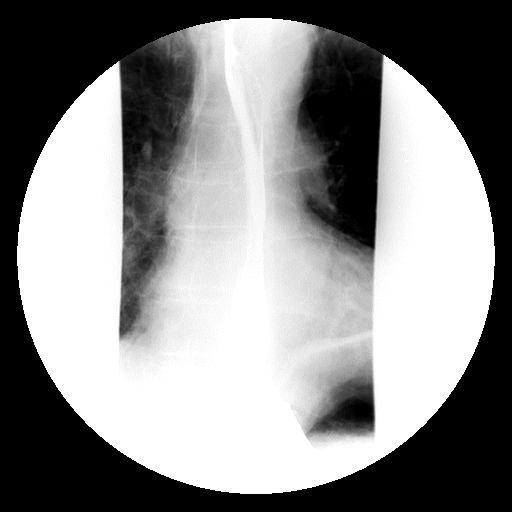

[Series 6: run · 1 of 1 slices shown (6 of 14)]
[im 1/1]
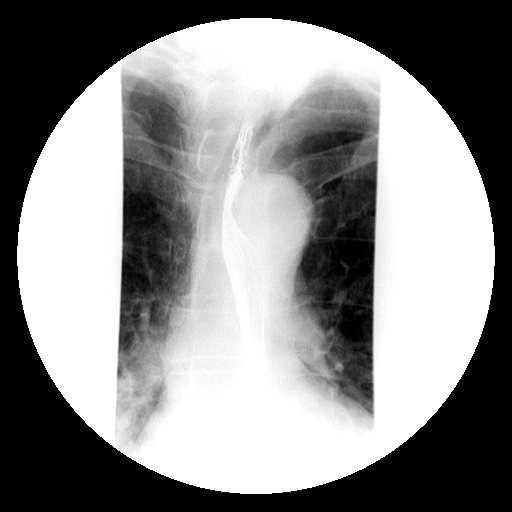

[Series 7: run · 1 of 1 slices shown (7 of 14)]
[im 1/1]
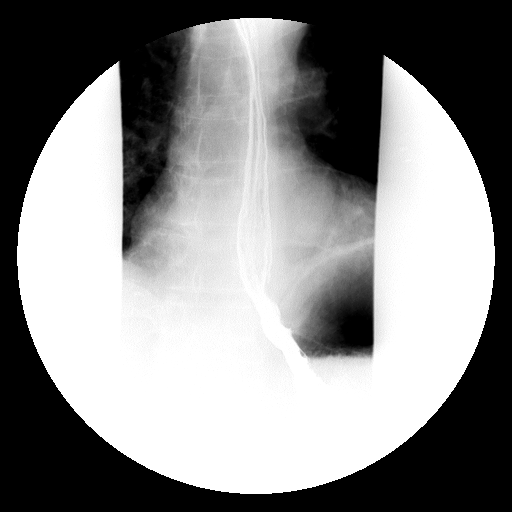

[Series 8: run · 1 of 1 slices shown (8 of 14)]
[im 1/1]
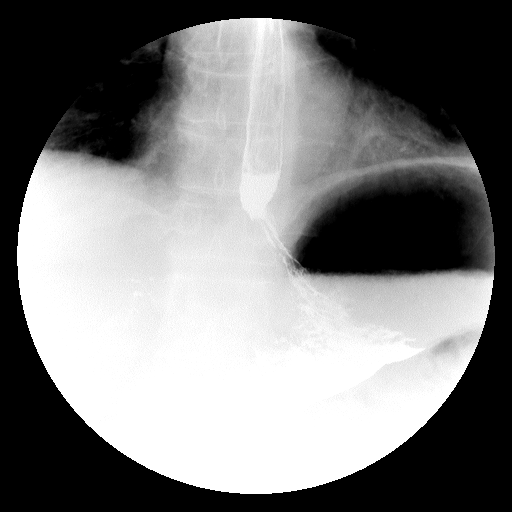

[Series 9: run · 1 of 1 slices shown (9 of 14)]
[im 1/1]
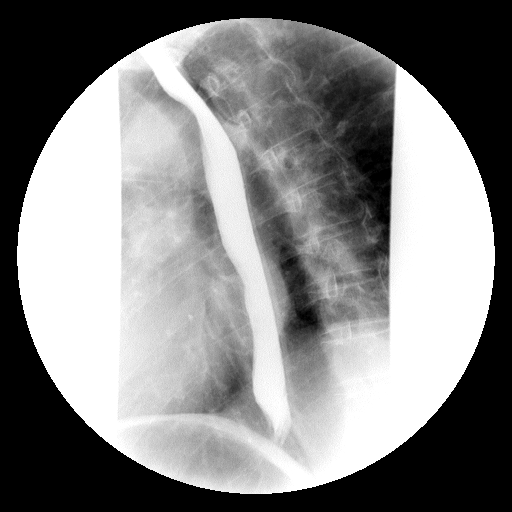

[Series 10: run · 1 of 1 slices shown (10 of 14)]
[im 1/1]
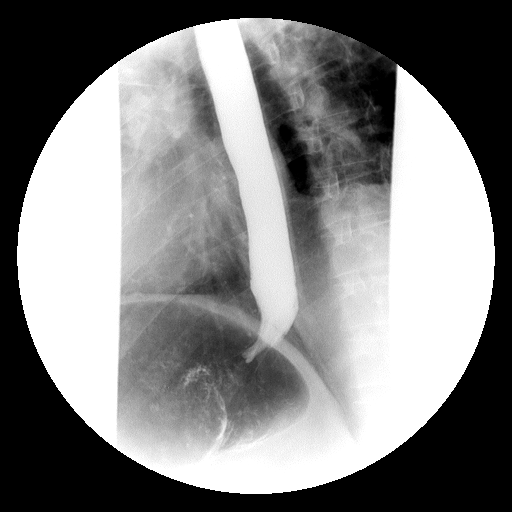

[Series 11: run · 1 of 1 slices shown (11 of 14)]
[im 1/1]
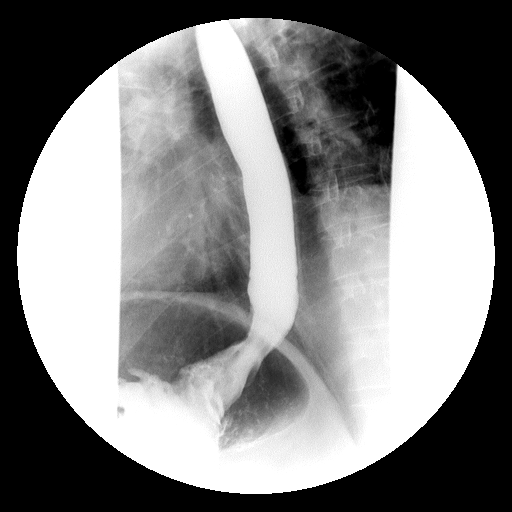

[Series 12: run · 1 of 1 slices shown (12 of 14)]
[im 1/1]
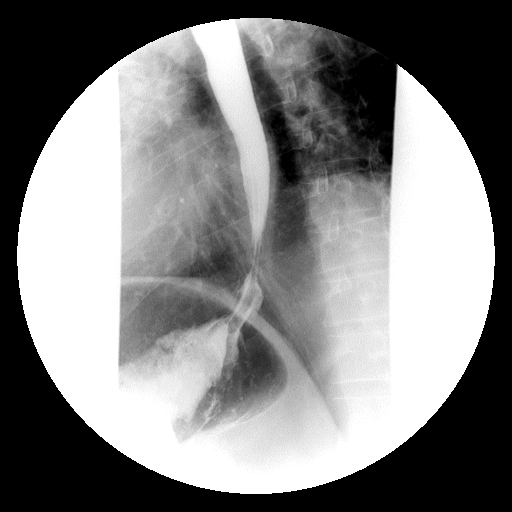

[Series 13: run · 1 of 1 slices shown (13 of 14)]
[im 1/1]
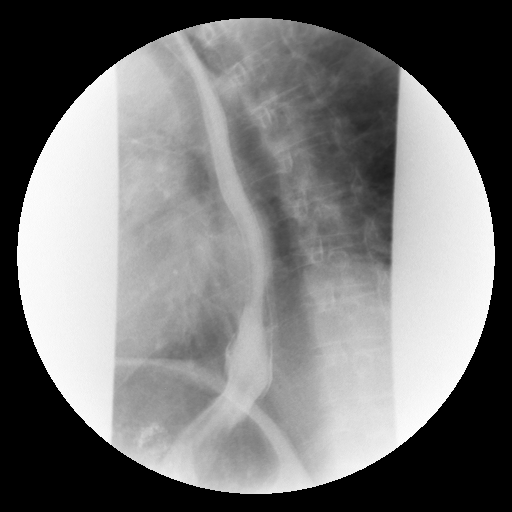

[Series 14: run · 1 of 1 slices shown (14 of 14)]
[im 1/1]
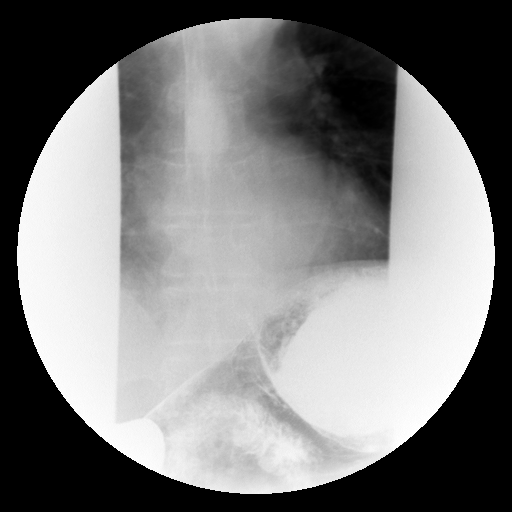

[14 of 14 positions shown; findings below may reference images not displayed]

FINDINGS: Normal esophageal peristalsis.

No fixed esophageal narrowing or stricture. 13 mm barium tablet
passed into the stomach without delay.

No evidence of hiatal hernia.

Mild esophageal reflux.
IMPRESSION: Mild gastroesophageal reflux.

## 2017-06-14 DIAGNOSIS — L820 Inflamed seborrheic keratosis: Secondary | ICD-10-CM | POA: Diagnosis not present

## 2017-06-14 DIAGNOSIS — L570 Actinic keratosis: Secondary | ICD-10-CM | POA: Diagnosis not present

## 2017-06-14 DIAGNOSIS — D229 Melanocytic nevi, unspecified: Secondary | ICD-10-CM | POA: Diagnosis not present

## 2017-06-14 DIAGNOSIS — D1801 Hemangioma of skin and subcutaneous tissue: Secondary | ICD-10-CM | POA: Diagnosis not present

## 2017-08-01 DIAGNOSIS — Z23 Encounter for immunization: Secondary | ICD-10-CM | POA: Diagnosis not present

## 2017-09-26 DIAGNOSIS — Z1389 Encounter for screening for other disorder: Secondary | ICD-10-CM | POA: Diagnosis not present

## 2017-09-26 DIAGNOSIS — E663 Overweight: Secondary | ICD-10-CM | POA: Diagnosis not present

## 2017-09-26 DIAGNOSIS — Z0000 Encounter for general adult medical examination without abnormal findings: Secondary | ICD-10-CM | POA: Diagnosis not present

## 2017-09-26 DIAGNOSIS — R030 Elevated blood-pressure reading, without diagnosis of hypertension: Secondary | ICD-10-CM | POA: Diagnosis not present

## 2017-09-26 DIAGNOSIS — K219 Gastro-esophageal reflux disease without esophagitis: Secondary | ICD-10-CM | POA: Diagnosis not present

## 2018-01-31 ENCOUNTER — Other Ambulatory Visit: Payer: Self-pay

## 2018-01-31 DIAGNOSIS — D485 Neoplasm of uncertain behavior of skin: Secondary | ICD-10-CM | POA: Diagnosis not present

## 2018-01-31 DIAGNOSIS — L570 Actinic keratosis: Secondary | ICD-10-CM | POA: Diagnosis not present

## 2018-01-31 DIAGNOSIS — L918 Other hypertrophic disorders of the skin: Secondary | ICD-10-CM | POA: Diagnosis not present

## 2018-01-31 DIAGNOSIS — D229 Melanocytic nevi, unspecified: Secondary | ICD-10-CM | POA: Diagnosis not present

## 2018-01-31 DIAGNOSIS — D1801 Hemangioma of skin and subcutaneous tissue: Secondary | ICD-10-CM | POA: Diagnosis not present

## 2018-01-31 DIAGNOSIS — L821 Other seborrheic keratosis: Secondary | ICD-10-CM | POA: Diagnosis not present

## 2018-05-16 DIAGNOSIS — H524 Presbyopia: Secondary | ICD-10-CM | POA: Diagnosis not present

## 2018-05-16 DIAGNOSIS — H5203 Hypermetropia, bilateral: Secondary | ICD-10-CM | POA: Diagnosis not present

## 2018-05-16 DIAGNOSIS — H2513 Age-related nuclear cataract, bilateral: Secondary | ICD-10-CM | POA: Diagnosis not present

## 2018-05-17 DIAGNOSIS — R208 Other disturbances of skin sensation: Secondary | ICD-10-CM | POA: Diagnosis not present

## 2018-05-17 DIAGNOSIS — B078 Other viral warts: Secondary | ICD-10-CM | POA: Diagnosis not present

## 2018-06-19 DIAGNOSIS — B078 Other viral warts: Secondary | ICD-10-CM | POA: Diagnosis not present

## 2018-06-19 DIAGNOSIS — T1512XA Foreign body in conjunctival sac, left eye, initial encounter: Secondary | ICD-10-CM | POA: Diagnosis not present

## 2018-06-19 DIAGNOSIS — L570 Actinic keratosis: Secondary | ICD-10-CM | POA: Diagnosis not present

## 2018-06-19 DIAGNOSIS — R208 Other disturbances of skin sensation: Secondary | ICD-10-CM | POA: Diagnosis not present

## 2018-07-12 DIAGNOSIS — Z23 Encounter for immunization: Secondary | ICD-10-CM | POA: Diagnosis not present

## 2018-08-02 DIAGNOSIS — L989 Disorder of the skin and subcutaneous tissue, unspecified: Secondary | ICD-10-CM | POA: Diagnosis not present

## 2018-08-02 DIAGNOSIS — B078 Other viral warts: Secondary | ICD-10-CM | POA: Diagnosis not present

## 2018-10-03 DIAGNOSIS — K219 Gastro-esophageal reflux disease without esophagitis: Secondary | ICD-10-CM | POA: Diagnosis not present

## 2018-10-03 DIAGNOSIS — Z1389 Encounter for screening for other disorder: Secondary | ICD-10-CM | POA: Diagnosis not present

## 2018-10-03 DIAGNOSIS — E7800 Pure hypercholesterolemia, unspecified: Secondary | ICD-10-CM | POA: Diagnosis not present

## 2018-10-03 DIAGNOSIS — Z0000 Encounter for general adult medical examination without abnormal findings: Secondary | ICD-10-CM | POA: Diagnosis not present

## 2019-05-22 DIAGNOSIS — H0100B Unspecified blepharitis left eye, upper and lower eyelids: Secondary | ICD-10-CM | POA: Diagnosis not present

## 2019-05-22 DIAGNOSIS — H2513 Age-related nuclear cataract, bilateral: Secondary | ICD-10-CM | POA: Diagnosis not present

## 2019-05-22 DIAGNOSIS — H5203 Hypermetropia, bilateral: Secondary | ICD-10-CM | POA: Diagnosis not present

## 2019-05-22 DIAGNOSIS — H0100A Unspecified blepharitis right eye, upper and lower eyelids: Secondary | ICD-10-CM | POA: Diagnosis not present

## 2019-07-29 DIAGNOSIS — Z23 Encounter for immunization: Secondary | ICD-10-CM | POA: Diagnosis not present

## 2019-09-05 DIAGNOSIS — D225 Melanocytic nevi of trunk: Secondary | ICD-10-CM | POA: Diagnosis not present

## 2019-09-05 DIAGNOSIS — L570 Actinic keratosis: Secondary | ICD-10-CM | POA: Diagnosis not present

## 2019-09-05 DIAGNOSIS — D1801 Hemangioma of skin and subcutaneous tissue: Secondary | ICD-10-CM | POA: Diagnosis not present

## 2019-09-05 DIAGNOSIS — B351 Tinea unguium: Secondary | ICD-10-CM | POA: Diagnosis not present

## 2019-09-05 DIAGNOSIS — L738 Other specified follicular disorders: Secondary | ICD-10-CM | POA: Diagnosis not present

## 2019-11-18 DIAGNOSIS — R030 Elevated blood-pressure reading, without diagnosis of hypertension: Secondary | ICD-10-CM | POA: Diagnosis not present

## 2019-12-11 ENCOUNTER — Ambulatory Visit: Payer: Medicare Other

## 2020-03-04 DIAGNOSIS — D485 Neoplasm of uncertain behavior of skin: Secondary | ICD-10-CM | POA: Diagnosis not present

## 2020-03-04 DIAGNOSIS — C44319 Basal cell carcinoma of skin of other parts of face: Secondary | ICD-10-CM | POA: Diagnosis not present

## 2020-03-04 DIAGNOSIS — L570 Actinic keratosis: Secondary | ICD-10-CM | POA: Diagnosis not present

## 2020-03-04 DIAGNOSIS — L298 Other pruritus: Secondary | ICD-10-CM | POA: Diagnosis not present

## 2020-03-25 DIAGNOSIS — L570 Actinic keratosis: Secondary | ICD-10-CM | POA: Diagnosis not present

## 2020-03-25 DIAGNOSIS — C44319 Basal cell carcinoma of skin of other parts of face: Secondary | ICD-10-CM | POA: Diagnosis not present

## 2020-05-21 DIAGNOSIS — H9313 Tinnitus, bilateral: Secondary | ICD-10-CM | POA: Diagnosis not present

## 2020-05-21 DIAGNOSIS — H6123 Impacted cerumen, bilateral: Secondary | ICD-10-CM | POA: Diagnosis not present

## 2020-05-21 DIAGNOSIS — H903 Sensorineural hearing loss, bilateral: Secondary | ICD-10-CM | POA: Diagnosis not present

## 2020-05-21 DIAGNOSIS — H60313 Diffuse otitis externa, bilateral: Secondary | ICD-10-CM | POA: Diagnosis not present

## 2020-06-02 DIAGNOSIS — H0015 Chalazion left lower eyelid: Secondary | ICD-10-CM | POA: Diagnosis not present

## 2020-06-02 DIAGNOSIS — H0100A Unspecified blepharitis right eye, upper and lower eyelids: Secondary | ICD-10-CM | POA: Diagnosis not present

## 2020-06-02 DIAGNOSIS — H524 Presbyopia: Secondary | ICD-10-CM | POA: Diagnosis not present

## 2020-06-02 DIAGNOSIS — H2513 Age-related nuclear cataract, bilateral: Secondary | ICD-10-CM | POA: Diagnosis not present

## 2020-09-07 DIAGNOSIS — L821 Other seborrheic keratosis: Secondary | ICD-10-CM | POA: Diagnosis not present

## 2020-09-07 DIAGNOSIS — L570 Actinic keratosis: Secondary | ICD-10-CM | POA: Diagnosis not present

## 2020-09-07 DIAGNOSIS — Z85828 Personal history of other malignant neoplasm of skin: Secondary | ICD-10-CM | POA: Diagnosis not present

## 2020-09-07 DIAGNOSIS — D485 Neoplasm of uncertain behavior of skin: Secondary | ICD-10-CM | POA: Diagnosis not present

## 2020-09-07 DIAGNOSIS — D229 Melanocytic nevi, unspecified: Secondary | ICD-10-CM | POA: Diagnosis not present

## 2020-09-07 DIAGNOSIS — L814 Other melanin hyperpigmentation: Secondary | ICD-10-CM | POA: Diagnosis not present

## 2020-09-07 DIAGNOSIS — L249 Irritant contact dermatitis, unspecified cause: Secondary | ICD-10-CM | POA: Diagnosis not present

## 2020-09-07 DIAGNOSIS — L905 Scar conditions and fibrosis of skin: Secondary | ICD-10-CM | POA: Diagnosis not present

## 2020-09-07 DIAGNOSIS — L819 Disorder of pigmentation, unspecified: Secondary | ICD-10-CM | POA: Diagnosis not present

## 2020-09-07 DIAGNOSIS — C44329 Squamous cell carcinoma of skin of other parts of face: Secondary | ICD-10-CM | POA: Diagnosis not present

## 2020-09-07 DIAGNOSIS — D1801 Hemangioma of skin and subcutaneous tissue: Secondary | ICD-10-CM | POA: Diagnosis not present

## 2020-09-24 DIAGNOSIS — Z23 Encounter for immunization: Secondary | ICD-10-CM | POA: Diagnosis not present

## 2020-09-24 DIAGNOSIS — E7800 Pure hypercholesterolemia, unspecified: Secondary | ICD-10-CM | POA: Diagnosis not present

## 2020-09-24 DIAGNOSIS — Z1389 Encounter for screening for other disorder: Secondary | ICD-10-CM | POA: Diagnosis not present

## 2020-09-24 DIAGNOSIS — Z0000 Encounter for general adult medical examination without abnormal findings: Secondary | ICD-10-CM | POA: Diagnosis not present

## 2020-09-24 DIAGNOSIS — K219 Gastro-esophageal reflux disease without esophagitis: Secondary | ICD-10-CM | POA: Diagnosis not present

## 2020-09-24 DIAGNOSIS — Z86010 Personal history of colonic polyps: Secondary | ICD-10-CM | POA: Diagnosis not present

## 2020-10-07 DIAGNOSIS — C44319 Basal cell carcinoma of skin of other parts of face: Secondary | ICD-10-CM | POA: Diagnosis not present

## 2020-10-30 DIAGNOSIS — H2513 Age-related nuclear cataract, bilateral: Secondary | ICD-10-CM | POA: Diagnosis not present

## 2021-03-04 DIAGNOSIS — L821 Other seborrheic keratosis: Secondary | ICD-10-CM | POA: Diagnosis not present

## 2021-03-04 DIAGNOSIS — L570 Actinic keratosis: Secondary | ICD-10-CM | POA: Diagnosis not present

## 2021-03-04 DIAGNOSIS — L905 Scar conditions and fibrosis of skin: Secondary | ICD-10-CM | POA: Diagnosis not present

## 2021-03-04 DIAGNOSIS — L819 Disorder of pigmentation, unspecified: Secondary | ICD-10-CM | POA: Diagnosis not present

## 2021-03-04 DIAGNOSIS — D1801 Hemangioma of skin and subcutaneous tissue: Secondary | ICD-10-CM | POA: Diagnosis not present

## 2021-03-04 DIAGNOSIS — D229 Melanocytic nevi, unspecified: Secondary | ICD-10-CM | POA: Diagnosis not present

## 2021-03-04 DIAGNOSIS — Z85828 Personal history of other malignant neoplasm of skin: Secondary | ICD-10-CM | POA: Diagnosis not present

## 2021-03-04 DIAGNOSIS — L814 Other melanin hyperpigmentation: Secondary | ICD-10-CM | POA: Diagnosis not present

## 2021-03-05 DIAGNOSIS — K219 Gastro-esophageal reflux disease without esophagitis: Secondary | ICD-10-CM | POA: Diagnosis not present

## 2021-04-05 DIAGNOSIS — L570 Actinic keratosis: Secondary | ICD-10-CM | POA: Diagnosis not present

## 2021-05-21 DIAGNOSIS — H938X1 Other specified disorders of right ear: Secondary | ICD-10-CM | POA: Diagnosis not present

## 2021-05-21 DIAGNOSIS — L299 Pruritus, unspecified: Secondary | ICD-10-CM | POA: Diagnosis not present

## 2021-05-21 DIAGNOSIS — H6123 Impacted cerumen, bilateral: Secondary | ICD-10-CM | POA: Diagnosis not present

## 2021-05-21 DIAGNOSIS — H6122 Impacted cerumen, left ear: Secondary | ICD-10-CM | POA: Diagnosis not present

## 2021-05-21 DIAGNOSIS — H903 Sensorineural hearing loss, bilateral: Secondary | ICD-10-CM | POA: Diagnosis not present

## 2021-06-08 DIAGNOSIS — H1789 Other corneal scars and opacities: Secondary | ICD-10-CM | POA: Diagnosis not present

## 2021-06-08 DIAGNOSIS — H25011 Cortical age-related cataract, right eye: Secondary | ICD-10-CM | POA: Diagnosis not present

## 2021-06-08 DIAGNOSIS — H524 Presbyopia: Secondary | ICD-10-CM | POA: Diagnosis not present

## 2021-06-08 DIAGNOSIS — H2513 Age-related nuclear cataract, bilateral: Secondary | ICD-10-CM | POA: Diagnosis not present

## 2021-09-27 DIAGNOSIS — K219 Gastro-esophageal reflux disease without esophagitis: Secondary | ICD-10-CM | POA: Diagnosis not present

## 2021-09-27 DIAGNOSIS — Z23 Encounter for immunization: Secondary | ICD-10-CM | POA: Diagnosis not present

## 2021-09-27 DIAGNOSIS — R030 Elevated blood-pressure reading, without diagnosis of hypertension: Secondary | ICD-10-CM | POA: Diagnosis not present

## 2021-09-27 DIAGNOSIS — E7800 Pure hypercholesterolemia, unspecified: Secondary | ICD-10-CM | POA: Diagnosis not present

## 2021-09-27 DIAGNOSIS — Z0000 Encounter for general adult medical examination without abnormal findings: Secondary | ICD-10-CM | POA: Diagnosis not present

## 2021-09-27 DIAGNOSIS — Z1389 Encounter for screening for other disorder: Secondary | ICD-10-CM | POA: Diagnosis not present

## 2021-09-27 DIAGNOSIS — Z86010 Personal history of colonic polyps: Secondary | ICD-10-CM | POA: Diagnosis not present

## 2021-09-28 DIAGNOSIS — Z85828 Personal history of other malignant neoplasm of skin: Secondary | ICD-10-CM | POA: Diagnosis not present

## 2021-09-28 DIAGNOSIS — D485 Neoplasm of uncertain behavior of skin: Secondary | ICD-10-CM | POA: Diagnosis not present

## 2021-09-28 DIAGNOSIS — L570 Actinic keratosis: Secondary | ICD-10-CM | POA: Diagnosis not present

## 2021-09-28 DIAGNOSIS — Z08 Encounter for follow-up examination after completed treatment for malignant neoplasm: Secondary | ICD-10-CM | POA: Diagnosis not present

## 2021-09-30 DIAGNOSIS — J101 Influenza due to other identified influenza virus with other respiratory manifestations: Secondary | ICD-10-CM | POA: Diagnosis not present

## 2021-09-30 DIAGNOSIS — U071 COVID-19: Secondary | ICD-10-CM | POA: Diagnosis not present

## 2021-11-10 DIAGNOSIS — Z08 Encounter for follow-up examination after completed treatment for malignant neoplasm: Secondary | ICD-10-CM | POA: Diagnosis not present

## 2021-11-10 DIAGNOSIS — Z85828 Personal history of other malignant neoplasm of skin: Secondary | ICD-10-CM | POA: Diagnosis not present

## 2021-11-10 DIAGNOSIS — C44319 Basal cell carcinoma of skin of other parts of face: Secondary | ICD-10-CM | POA: Diagnosis not present

## 2021-11-26 ENCOUNTER — Ambulatory Visit
Admission: RE | Admit: 2021-11-26 | Discharge: 2021-11-26 | Disposition: A | Discharge status: Home / Self Care | Payer: Medicare Other | Source: Ambulatory Visit | Attending: Internal Medicine | Admitting: Internal Medicine

## 2021-11-26 ENCOUNTER — Other Ambulatory Visit: Payer: Self-pay | Admitting: Internal Medicine

## 2021-11-26 DIAGNOSIS — M79672 Pain in left foot: Secondary | ICD-10-CM

## 2021-11-30 ENCOUNTER — Other Ambulatory Visit: Payer: Self-pay | Admitting: Physician Assistant

## 2021-11-30 DIAGNOSIS — Z800 Family history of malignant neoplasm of digestive organs: Secondary | ICD-10-CM | POA: Diagnosis not present

## 2021-11-30 DIAGNOSIS — Z86010 Personal history of colonic polyps: Secondary | ICD-10-CM | POA: Diagnosis not present

## 2021-11-30 DIAGNOSIS — K219 Gastro-esophageal reflux disease without esophagitis: Secondary | ICD-10-CM | POA: Diagnosis not present

## 2021-11-30 DIAGNOSIS — R1310 Dysphagia, unspecified: Secondary | ICD-10-CM | POA: Diagnosis not present

## 2021-12-02 ENCOUNTER — Ambulatory Visit
Admission: RE | Admit: 2021-12-02 | Discharge: 2021-12-02 | Disposition: A | Discharge status: Home / Self Care | Payer: Medicare Other | Source: Ambulatory Visit | Attending: Physician Assistant | Admitting: Physician Assistant

## 2021-12-02 ENCOUNTER — Other Ambulatory Visit: Payer: Self-pay

## 2021-12-02 DIAGNOSIS — R1310 Dysphagia, unspecified: Secondary | ICD-10-CM

## 2021-12-02 DIAGNOSIS — K219 Gastro-esophageal reflux disease without esophagitis: Secondary | ICD-10-CM | POA: Diagnosis not present

## 2021-12-02 DIAGNOSIS — K449 Diaphragmatic hernia without obstruction or gangrene: Secondary | ICD-10-CM | POA: Diagnosis not present

## 2022-01-20 DIAGNOSIS — H60313 Diffuse otitis externa, bilateral: Secondary | ICD-10-CM | POA: Diagnosis not present

## 2022-01-20 DIAGNOSIS — H903 Sensorineural hearing loss, bilateral: Secondary | ICD-10-CM | POA: Diagnosis not present

## 2022-01-20 DIAGNOSIS — H9313 Tinnitus, bilateral: Secondary | ICD-10-CM | POA: Diagnosis not present

## 2022-01-20 DIAGNOSIS — H833X1 Noise effects on right inner ear: Secondary | ICD-10-CM | POA: Diagnosis not present

## 2022-01-20 DIAGNOSIS — H6122 Impacted cerumen, left ear: Secondary | ICD-10-CM | POA: Diagnosis not present

## 2022-01-20 DIAGNOSIS — Z87891 Personal history of nicotine dependence: Secondary | ICD-10-CM | POA: Diagnosis not present

## 2022-01-21 DIAGNOSIS — M79672 Pain in left foot: Secondary | ICD-10-CM | POA: Diagnosis not present

## 2022-02-08 DIAGNOSIS — C4442 Squamous cell carcinoma of skin of scalp and neck: Secondary | ICD-10-CM | POA: Diagnosis not present

## 2022-02-08 DIAGNOSIS — Z08 Encounter for follow-up examination after completed treatment for malignant neoplasm: Secondary | ICD-10-CM | POA: Diagnosis not present

## 2022-02-08 DIAGNOSIS — L814 Other melanin hyperpigmentation: Secondary | ICD-10-CM | POA: Diagnosis not present

## 2022-02-08 DIAGNOSIS — D225 Melanocytic nevi of trunk: Secondary | ICD-10-CM | POA: Diagnosis not present

## 2022-02-08 DIAGNOSIS — L821 Other seborrheic keratosis: Secondary | ICD-10-CM | POA: Diagnosis not present

## 2022-02-08 DIAGNOSIS — D492 Neoplasm of unspecified behavior of bone, soft tissue, and skin: Secondary | ICD-10-CM | POA: Diagnosis not present

## 2022-02-08 DIAGNOSIS — Z85828 Personal history of other malignant neoplasm of skin: Secondary | ICD-10-CM | POA: Diagnosis not present

## 2022-02-08 DIAGNOSIS — L570 Actinic keratosis: Secondary | ICD-10-CM | POA: Diagnosis not present

## 2022-02-21 DIAGNOSIS — M79672 Pain in left foot: Secondary | ICD-10-CM | POA: Diagnosis not present

## 2022-02-23 DIAGNOSIS — K219 Gastro-esophageal reflux disease without esophagitis: Secondary | ICD-10-CM | POA: Diagnosis not present

## 2022-02-23 DIAGNOSIS — K648 Other hemorrhoids: Secondary | ICD-10-CM | POA: Diagnosis not present

## 2022-02-23 DIAGNOSIS — K2100 Gastro-esophageal reflux disease with esophagitis, without bleeding: Secondary | ICD-10-CM | POA: Diagnosis not present

## 2022-02-23 DIAGNOSIS — Z800 Family history of malignant neoplasm of digestive organs: Secondary | ICD-10-CM | POA: Diagnosis not present

## 2022-02-23 DIAGNOSIS — Z86010 Personal history of colonic polyps: Secondary | ICD-10-CM | POA: Diagnosis not present

## 2022-02-23 DIAGNOSIS — D120 Benign neoplasm of cecum: Secondary | ICD-10-CM | POA: Diagnosis not present

## 2022-02-23 DIAGNOSIS — D123 Benign neoplasm of transverse colon: Secondary | ICD-10-CM | POA: Diagnosis not present

## 2022-02-23 DIAGNOSIS — K3189 Other diseases of stomach and duodenum: Secondary | ICD-10-CM | POA: Diagnosis not present

## 2022-02-23 DIAGNOSIS — D122 Benign neoplasm of ascending colon: Secondary | ICD-10-CM | POA: Diagnosis not present

## 2022-02-23 DIAGNOSIS — R1310 Dysphagia, unspecified: Secondary | ICD-10-CM | POA: Diagnosis not present

## 2022-02-23 DIAGNOSIS — K2289 Other specified disease of esophagus: Secondary | ICD-10-CM | POA: Diagnosis not present

## 2022-02-23 DIAGNOSIS — K2930 Chronic superficial gastritis without bleeding: Secondary | ICD-10-CM | POA: Diagnosis not present

## 2022-02-24 DIAGNOSIS — D044 Carcinoma in situ of skin of scalp and neck: Secondary | ICD-10-CM | POA: Diagnosis not present

## 2022-02-24 DIAGNOSIS — L570 Actinic keratosis: Secondary | ICD-10-CM | POA: Diagnosis not present

## 2022-02-24 DIAGNOSIS — L218 Other seborrheic dermatitis: Secondary | ICD-10-CM | POA: Diagnosis not present

## 2022-02-28 DIAGNOSIS — K2289 Other specified disease of esophagus: Secondary | ICD-10-CM | POA: Diagnosis not present

## 2022-02-28 DIAGNOSIS — D123 Benign neoplasm of transverse colon: Secondary | ICD-10-CM | POA: Diagnosis not present

## 2022-02-28 DIAGNOSIS — K219 Gastro-esophageal reflux disease without esophagitis: Secondary | ICD-10-CM | POA: Diagnosis not present

## 2022-02-28 DIAGNOSIS — K2930 Chronic superficial gastritis without bleeding: Secondary | ICD-10-CM | POA: Diagnosis not present

## 2022-02-28 DIAGNOSIS — D122 Benign neoplasm of ascending colon: Secondary | ICD-10-CM | POA: Diagnosis not present

## 2022-03-23 DIAGNOSIS — M79672 Pain in left foot: Secondary | ICD-10-CM | POA: Diagnosis not present

## 2022-03-23 DIAGNOSIS — M7742 Metatarsalgia, left foot: Secondary | ICD-10-CM | POA: Diagnosis not present

## 2022-04-27 DIAGNOSIS — M7742 Metatarsalgia, left foot: Secondary | ICD-10-CM | POA: Diagnosis not present

## 2022-07-13 DIAGNOSIS — L570 Actinic keratosis: Secondary | ICD-10-CM | POA: Diagnosis not present

## 2022-07-13 DIAGNOSIS — L821 Other seborrheic keratosis: Secondary | ICD-10-CM | POA: Diagnosis not present

## 2022-07-13 DIAGNOSIS — L218 Other seborrheic dermatitis: Secondary | ICD-10-CM | POA: Diagnosis not present

## 2022-07-13 DIAGNOSIS — L700 Acne vulgaris: Secondary | ICD-10-CM | POA: Diagnosis not present

## 2022-08-30 DIAGNOSIS — H903 Sensorineural hearing loss, bilateral: Secondary | ICD-10-CM | POA: Diagnosis not present

## 2022-08-30 DIAGNOSIS — Z461 Encounter for fitting and adjustment of hearing aid: Secondary | ICD-10-CM | POA: Diagnosis not present

## 2022-08-30 DIAGNOSIS — Z01118 Encounter for examination of ears and hearing with other abnormal findings: Secondary | ICD-10-CM | POA: Diagnosis not present

## 2022-09-07 DIAGNOSIS — H3500 Unspecified background retinopathy: Secondary | ICD-10-CM | POA: Diagnosis not present

## 2022-09-07 DIAGNOSIS — H524 Presbyopia: Secondary | ICD-10-CM | POA: Diagnosis not present

## 2022-09-07 DIAGNOSIS — H1789 Other corneal scars and opacities: Secondary | ICD-10-CM | POA: Diagnosis not present

## 2022-09-07 DIAGNOSIS — H2513 Age-related nuclear cataract, bilateral: Secondary | ICD-10-CM | POA: Diagnosis not present

## 2022-11-03 DIAGNOSIS — M79672 Pain in left foot: Secondary | ICD-10-CM | POA: Diagnosis not present

## 2022-11-03 DIAGNOSIS — R030 Elevated blood-pressure reading, without diagnosis of hypertension: Secondary | ICD-10-CM | POA: Diagnosis not present

## 2022-11-03 DIAGNOSIS — Z79899 Other long term (current) drug therapy: Secondary | ICD-10-CM | POA: Diagnosis not present

## 2022-11-03 DIAGNOSIS — E7800 Pure hypercholesterolemia, unspecified: Secondary | ICD-10-CM | POA: Diagnosis not present

## 2022-11-03 DIAGNOSIS — R1310 Dysphagia, unspecified: Secondary | ICD-10-CM | POA: Diagnosis not present

## 2022-11-03 DIAGNOSIS — Z1331 Encounter for screening for depression: Secondary | ICD-10-CM | POA: Diagnosis not present

## 2022-11-03 DIAGNOSIS — R202 Paresthesia of skin: Secondary | ICD-10-CM | POA: Diagnosis not present

## 2022-11-03 DIAGNOSIS — Z0000 Encounter for general adult medical examination without abnormal findings: Secondary | ICD-10-CM | POA: Diagnosis not present

## 2022-11-03 DIAGNOSIS — K219 Gastro-esophageal reflux disease without esophagitis: Secondary | ICD-10-CM | POA: Diagnosis not present

## 2022-11-03 DIAGNOSIS — Z86010 Personal history of colonic polyps: Secondary | ICD-10-CM | POA: Diagnosis not present

## 2022-11-03 DIAGNOSIS — Z23 Encounter for immunization: Secondary | ICD-10-CM | POA: Diagnosis not present

## 2022-12-07 DIAGNOSIS — D492 Neoplasm of unspecified behavior of bone, soft tissue, and skin: Secondary | ICD-10-CM | POA: Diagnosis not present

## 2022-12-07 DIAGNOSIS — L821 Other seborrheic keratosis: Secondary | ICD-10-CM | POA: Diagnosis not present

## 2022-12-07 DIAGNOSIS — L578 Other skin changes due to chronic exposure to nonionizing radiation: Secondary | ICD-10-CM | POA: Diagnosis not present

## 2023-02-09 DIAGNOSIS — R748 Abnormal levels of other serum enzymes: Secondary | ICD-10-CM | POA: Diagnosis not present

## 2023-02-09 DIAGNOSIS — D649 Anemia, unspecified: Secondary | ICD-10-CM | POA: Diagnosis not present

## 2023-03-10 DIAGNOSIS — Z01118 Encounter for examination of ears and hearing with other abnormal findings: Secondary | ICD-10-CM | POA: Diagnosis not present

## 2023-03-10 DIAGNOSIS — H60313 Diffuse otitis externa, bilateral: Secondary | ICD-10-CM | POA: Diagnosis not present

## 2023-03-10 DIAGNOSIS — H9313 Tinnitus, bilateral: Secondary | ICD-10-CM | POA: Diagnosis not present

## 2023-03-10 DIAGNOSIS — H6122 Impacted cerumen, left ear: Secondary | ICD-10-CM | POA: Diagnosis not present

## 2023-03-10 DIAGNOSIS — Z974 Presence of external hearing-aid: Secondary | ICD-10-CM | POA: Diagnosis not present

## 2023-03-10 DIAGNOSIS — H903 Sensorineural hearing loss, bilateral: Secondary | ICD-10-CM | POA: Diagnosis not present

## 2023-03-13 ENCOUNTER — Ambulatory Visit
Admission: RE | Admit: 2023-03-13 | Discharge: 2023-03-13 | Disposition: A | Discharge status: Home / Self Care | Payer: Medicare Other | Source: Ambulatory Visit | Attending: Internal Medicine | Admitting: Internal Medicine

## 2023-03-13 ENCOUNTER — Other Ambulatory Visit: Payer: Self-pay | Admitting: Internal Medicine

## 2023-03-13 DIAGNOSIS — S161XXA Strain of muscle, fascia and tendon at neck level, initial encounter: Secondary | ICD-10-CM | POA: Diagnosis not present

## 2023-03-13 DIAGNOSIS — M4722 Other spondylosis with radiculopathy, cervical region: Secondary | ICD-10-CM

## 2023-03-13 DIAGNOSIS — M4696 Unspecified inflammatory spondylopathy, lumbar region: Secondary | ICD-10-CM | POA: Diagnosis not present

## 2023-04-03 DIAGNOSIS — M4722 Other spondylosis with radiculopathy, cervical region: Secondary | ICD-10-CM | POA: Diagnosis not present

## 2023-04-03 DIAGNOSIS — M542 Cervicalgia: Secondary | ICD-10-CM | POA: Diagnosis not present

## 2023-04-03 DIAGNOSIS — K219 Gastro-esophageal reflux disease without esophagitis: Secondary | ICD-10-CM | POA: Diagnosis not present

## 2023-04-20 DIAGNOSIS — M542 Cervicalgia: Secondary | ICD-10-CM | POA: Diagnosis not present

## 2023-04-28 DIAGNOSIS — M542 Cervicalgia: Secondary | ICD-10-CM | POA: Diagnosis not present

## 2023-05-03 DIAGNOSIS — M542 Cervicalgia: Secondary | ICD-10-CM | POA: Diagnosis not present

## 2023-05-09 DIAGNOSIS — M542 Cervicalgia: Secondary | ICD-10-CM | POA: Diagnosis not present

## 2023-05-16 DIAGNOSIS — M542 Cervicalgia: Secondary | ICD-10-CM | POA: Diagnosis not present

## 2023-05-25 DIAGNOSIS — M542 Cervicalgia: Secondary | ICD-10-CM | POA: Diagnosis not present

## 2023-05-30 DIAGNOSIS — M542 Cervicalgia: Secondary | ICD-10-CM | POA: Diagnosis not present

## 2023-06-19 DIAGNOSIS — Z85828 Personal history of other malignant neoplasm of skin: Secondary | ICD-10-CM | POA: Diagnosis not present

## 2023-06-19 DIAGNOSIS — Z08 Encounter for follow-up examination after completed treatment for malignant neoplasm: Secondary | ICD-10-CM | POA: Diagnosis not present

## 2023-06-19 DIAGNOSIS — L570 Actinic keratosis: Secondary | ICD-10-CM | POA: Diagnosis not present

## 2023-06-19 DIAGNOSIS — L814 Other melanin hyperpigmentation: Secondary | ICD-10-CM | POA: Diagnosis not present

## 2023-06-19 DIAGNOSIS — D225 Melanocytic nevi of trunk: Secondary | ICD-10-CM | POA: Diagnosis not present

## 2023-06-19 DIAGNOSIS — L821 Other seborrheic keratosis: Secondary | ICD-10-CM | POA: Diagnosis not present

## 2023-09-13 DIAGNOSIS — H5202 Hypermetropia, left eye: Secondary | ICD-10-CM | POA: Diagnosis not present

## 2023-09-13 DIAGNOSIS — H2513 Age-related nuclear cataract, bilateral: Secondary | ICD-10-CM | POA: Diagnosis not present

## 2023-09-13 DIAGNOSIS — H524 Presbyopia: Secondary | ICD-10-CM | POA: Diagnosis not present

## 2023-09-13 DIAGNOSIS — H25013 Cortical age-related cataract, bilateral: Secondary | ICD-10-CM | POA: Diagnosis not present

## 2023-11-06 DIAGNOSIS — Z860101 Personal history of adenomatous and serrated colon polyps: Secondary | ICD-10-CM | POA: Diagnosis not present

## 2023-11-06 DIAGNOSIS — E7800 Pure hypercholesterolemia, unspecified: Secondary | ICD-10-CM | POA: Diagnosis not present

## 2023-11-06 DIAGNOSIS — Z0000 Encounter for general adult medical examination without abnormal findings: Secondary | ICD-10-CM | POA: Diagnosis not present

## 2023-11-06 DIAGNOSIS — M4722 Other spondylosis with radiculopathy, cervical region: Secondary | ICD-10-CM | POA: Diagnosis not present

## 2023-11-06 DIAGNOSIS — D509 Iron deficiency anemia, unspecified: Secondary | ICD-10-CM | POA: Diagnosis not present

## 2023-11-06 DIAGNOSIS — K219 Gastro-esophageal reflux disease without esophagitis: Secondary | ICD-10-CM | POA: Diagnosis not present

## 2023-11-06 DIAGNOSIS — Z79899 Other long term (current) drug therapy: Secondary | ICD-10-CM | POA: Diagnosis not present

## 2023-11-06 DIAGNOSIS — R221 Localized swelling, mass and lump, neck: Secondary | ICD-10-CM | POA: Diagnosis not present

## 2023-11-06 DIAGNOSIS — R202 Paresthesia of skin: Secondary | ICD-10-CM | POA: Diagnosis not present

## 2023-11-06 DIAGNOSIS — R030 Elevated blood-pressure reading, without diagnosis of hypertension: Secondary | ICD-10-CM | POA: Diagnosis not present

## 2023-11-06 DIAGNOSIS — Z23 Encounter for immunization: Secondary | ICD-10-CM | POA: Diagnosis not present

## 2023-11-08 DIAGNOSIS — Z08 Encounter for follow-up examination after completed treatment for malignant neoplasm: Secondary | ICD-10-CM | POA: Diagnosis not present

## 2023-11-08 DIAGNOSIS — L820 Inflamed seborrheic keratosis: Secondary | ICD-10-CM | POA: Diagnosis not present

## 2023-11-08 DIAGNOSIS — L538 Other specified erythematous conditions: Secondary | ICD-10-CM | POA: Diagnosis not present

## 2023-11-08 DIAGNOSIS — M47812 Spondylosis without myelopathy or radiculopathy, cervical region: Secondary | ICD-10-CM | POA: Diagnosis not present

## 2023-11-08 DIAGNOSIS — D492 Neoplasm of unspecified behavior of bone, soft tissue, and skin: Secondary | ICD-10-CM | POA: Diagnosis not present

## 2023-11-08 DIAGNOSIS — Z85828 Personal history of other malignant neoplasm of skin: Secondary | ICD-10-CM | POA: Diagnosis not present

## 2023-11-08 DIAGNOSIS — L570 Actinic keratosis: Secondary | ICD-10-CM | POA: Diagnosis not present

## 2023-11-08 DIAGNOSIS — D0421 Carcinoma in situ of skin of right ear and external auricular canal: Secondary | ICD-10-CM | POA: Diagnosis not present

## 2023-11-08 DIAGNOSIS — C44319 Basal cell carcinoma of skin of other parts of face: Secondary | ICD-10-CM | POA: Diagnosis not present

## 2023-11-17 DIAGNOSIS — D649 Anemia, unspecified: Secondary | ICD-10-CM | POA: Diagnosis not present

## 2023-11-22 DIAGNOSIS — R221 Localized swelling, mass and lump, neck: Secondary | ICD-10-CM | POA: Diagnosis not present

## 2023-11-22 DIAGNOSIS — D509 Iron deficiency anemia, unspecified: Secondary | ICD-10-CM | POA: Diagnosis not present

## 2023-11-22 DIAGNOSIS — R590 Localized enlarged lymph nodes: Secondary | ICD-10-CM | POA: Diagnosis not present

## 2023-11-22 DIAGNOSIS — E041 Nontoxic single thyroid nodule: Secondary | ICD-10-CM | POA: Diagnosis not present

## 2023-11-22 DIAGNOSIS — Z860100 Personal history of colon polyps, unspecified: Secondary | ICD-10-CM | POA: Diagnosis not present

## 2023-11-22 DIAGNOSIS — K219 Gastro-esophageal reflux disease without esophagitis: Secondary | ICD-10-CM | POA: Diagnosis not present

## 2023-11-22 DIAGNOSIS — R195 Other fecal abnormalities: Secondary | ICD-10-CM | POA: Diagnosis not present

## 2023-11-28 ENCOUNTER — Other Ambulatory Visit: Payer: Self-pay | Admitting: Internal Medicine

## 2023-11-28 DIAGNOSIS — E041 Nontoxic single thyroid nodule: Secondary | ICD-10-CM

## 2023-11-30 ENCOUNTER — Other Ambulatory Visit (HOSPITAL_COMMUNITY)
Admission: RE | Admit: 2023-11-30 | Discharge: 2023-11-30 | Disposition: A | Discharge status: Home / Self Care | Payer: Medicare Other | Source: Ambulatory Visit | Attending: Internal Medicine | Admitting: Internal Medicine

## 2023-11-30 ENCOUNTER — Ambulatory Visit
Admission: RE | Admit: 2023-11-30 | Discharge: 2023-11-30 | Disposition: A | Discharge status: Home / Self Care | Payer: Medicare Other | Source: Ambulatory Visit | Attending: Internal Medicine | Admitting: Internal Medicine

## 2023-11-30 DIAGNOSIS — E041 Nontoxic single thyroid nodule: Secondary | ICD-10-CM

## 2023-11-30 DIAGNOSIS — D34 Benign neoplasm of thyroid gland: Secondary | ICD-10-CM | POA: Diagnosis not present

## 2023-12-01 DIAGNOSIS — D509 Iron deficiency anemia, unspecified: Secondary | ICD-10-CM | POA: Diagnosis not present

## 2023-12-04 LAB — CYTOLOGY - NON PAP

## 2023-12-05 DIAGNOSIS — Z461 Encounter for fitting and adjustment of hearing aid: Secondary | ICD-10-CM | POA: Diagnosis not present

## 2023-12-05 DIAGNOSIS — H903 Sensorineural hearing loss, bilateral: Secondary | ICD-10-CM | POA: Diagnosis not present

## 2023-12-06 DIAGNOSIS — D0421 Carcinoma in situ of skin of right ear and external auricular canal: Secondary | ICD-10-CM | POA: Diagnosis not present

## 2023-12-18 ENCOUNTER — Encounter (HOSPITAL_COMMUNITY): Payer: Self-pay

## 2023-12-26 DIAGNOSIS — R590 Localized enlarged lymph nodes: Secondary | ICD-10-CM | POA: Diagnosis not present

## 2023-12-26 DIAGNOSIS — E041 Nontoxic single thyroid nodule: Secondary | ICD-10-CM | POA: Diagnosis not present

## 2023-12-27 DIAGNOSIS — E041 Nontoxic single thyroid nodule: Secondary | ICD-10-CM | POA: Diagnosis not present

## 2023-12-28 ENCOUNTER — Other Ambulatory Visit (HOSPITAL_COMMUNITY): Payer: Self-pay | Admitting: Surgery

## 2023-12-28 ENCOUNTER — Encounter: Payer: Self-pay | Admitting: Surgery

## 2023-12-28 DIAGNOSIS — R590 Localized enlarged lymph nodes: Secondary | ICD-10-CM

## 2023-12-29 ENCOUNTER — Ambulatory Visit (HOSPITAL_COMMUNITY)
Admission: RE | Admit: 2023-12-29 | Discharge: 2023-12-29 | Disposition: A | Discharge status: Home / Self Care | Source: Ambulatory Visit | Attending: Surgery | Admitting: Surgery

## 2023-12-29 DIAGNOSIS — E042 Nontoxic multinodular goiter: Secondary | ICD-10-CM | POA: Diagnosis not present

## 2023-12-29 DIAGNOSIS — R590 Localized enlarged lymph nodes: Secondary | ICD-10-CM | POA: Diagnosis not present

## 2024-01-01 NOTE — Progress Notes (Signed)
 Oley Balm, MD  Claudean Kinds PROCEDURE / BIOPSY REVIEW Date: 01/01/24  Requested Biopsy site: R cerv LAN Reason for request: thyroid neoplasm, pathologic LNs on [place]:  Note is made of prominent/borderline enlarged right cervical and submandibular chain lymph nodes with index right cervical node measuring 0.8 cm in greatest short axis diameter (image 45) and index right submandibular chain lymph node measuring 0.9 cm (image 57), both demonstrating cortical thickening. No evidence of suppuration. Imaging review: Best seen on [place] 11/22/23  Decision: Approved Imaging modality to perform: Ultrasound Schedule with: No sedation / Local anesthetic Schedule for: Any VIR  Additional comments: @Schedulers . Do at hospital not outpt clinic  Please contact me with questions, concerns, or if issue pertaining to this request arise.  Dayne Oley Balm, MD [facility]       Previous Messages    ----- Message ----- From: Claudean Kinds Sent: 01/01/2024   9:35 AM EDT To: Claudean Kinds; Ir Procedure Requests Subject: [place] CORE BIOPSY (LYMPH NODES)                  Procedure : [place] CORE BIOPSY (LYMPH NODES)  Reason : FNA of lymph nodes seen on thyroid US 11/25/23 in PACS Dx: Cervical lymphadenopathy [R59.0 (ICD-10-CM)]  History : US Thyroid - 12/29/23 , US thyroid 11/25/23 in PACS , [place] FNA bx thyroid  Provider : Darnell Level, MD  Provider contact:  [phone]

## 2024-01-09 ENCOUNTER — Encounter: Payer: Self-pay | Admitting: Surgery

## 2024-01-09 NOTE — Progress Notes (Signed)
 USN results are reviewed.  Lymphadenopathy in the neck is noted.  Agree with proceeding with biopsy which is scheduled in interventional radiology on 01/16/2024.  Await results.  Darnell Level, MD [facility] practice Office: [phone]

## 2024-01-16 ENCOUNTER — Ambulatory Visit (HOSPITAL_COMMUNITY)
Admission: RE | Admit: 2024-01-16 | Discharge: 2024-01-16 | Disposition: A | Discharge status: Home / Self Care | Source: Ambulatory Visit | Attending: Surgery | Admitting: Surgery

## 2024-01-16 DIAGNOSIS — R897 Abnormal histological findings in specimens from other organs, systems and tissues: Secondary | ICD-10-CM | POA: Diagnosis not present

## 2024-01-16 DIAGNOSIS — R590 Localized enlarged lymph nodes: Secondary | ICD-10-CM | POA: Insufficient documentation

## 2024-01-16 DIAGNOSIS — D34 Benign neoplasm of thyroid gland: Secondary | ICD-10-CM | POA: Insufficient documentation

## 2024-01-16 DIAGNOSIS — R599 Enlarged lymph nodes, unspecified: Secondary | ICD-10-CM | POA: Diagnosis not present

## 2024-01-16 MED ORDER — LIDOCAINE HCL (PF) 1 % IJ SOLN
3.0000 mL | Freq: Once | INTRAMUSCULAR | Status: AC
  Administered 2024-01-16: 3 mL via INTRADERMAL

## 2024-01-16 NOTE — Procedures (Signed)
 Interventional Radiology Procedure:   Indications: Right cervical lymphadenopathy  Procedure: US guided biopsy of right cervical lymph node  Findings: 6 core biopsies from enlarged right cervical lymph node.   Complications: None     EBL: Minimal  Plan: Discharge to home.   Traeton Bordas R. Lowella Dandy, MD  Pager: [phone]

## 2024-01-22 DIAGNOSIS — C44319 Basal cell carcinoma of skin of other parts of face: Secondary | ICD-10-CM | POA: Diagnosis not present

## 2024-01-24 LAB — SURGICAL PATHOLOGY

## 2024-01-25 DIAGNOSIS — H833X1 Noise effects on right inner ear: Secondary | ICD-10-CM | POA: Diagnosis not present

## 2024-01-25 DIAGNOSIS — H60313 Diffuse otitis externa, bilateral: Secondary | ICD-10-CM | POA: Diagnosis not present

## 2024-01-25 DIAGNOSIS — H6122 Impacted cerumen, left ear: Secondary | ICD-10-CM | POA: Diagnosis not present

## 2024-01-25 DIAGNOSIS — H9313 Tinnitus, bilateral: Secondary | ICD-10-CM | POA: Diagnosis not present

## 2024-01-25 DIAGNOSIS — H903 Sensorineural hearing loss, bilateral: Secondary | ICD-10-CM | POA: Diagnosis not present

## 2024-02-07 ENCOUNTER — Ambulatory Visit: Payer: Self-pay | Admitting: Surgery

## 2024-02-15 NOTE — Patient Instructions (Addendum)
 SURGICAL WAITING ROOM VISITATION Patients having surgery or a procedure may have no more than 2 support people in the waiting area - these visitors may rotate in the visitor waiting room.   If the patient needs to stay at the hospital during part of their recovery, the visitor guidelines for inpatient rooms apply.  PRE-OP VISITATION  Pre-op nurse will coordinate an appropriate time for 1 support person to accompany the patient in pre-op.  This support person may not rotate.  This visitor will be contacted when the time is appropriate for the visitor to come back in the pre-op area.  Please refer to the [facility] website for the visitor guidelines for Inpatients (after your surgery is over and you are in a regular room).  You are not required to quarantine at this time prior to your surgery. However, you must do this: Hand Hygiene often Do NOT share personal items Notify your provider if you are in close contact with someone who has COVID or you develop fever 100.4 or greater, new onset of sneezing, cough, sore throat, shortness of breath or body aches.  If you test positive for Covid or have been in contact with anyone that has tested positive in the last 10 days please notify you surgeon.    Your procedure is scheduled on:  February 19, 2024  Report to [facility] Main Entrance: [address] entrance where the [employer] is available.   Report to admitting at: 11:45    AM  Call this number if you have any questions or problems the morning of surgery [phone]  FOLLOW ANY ADDITIONAL PRE OP INSTRUCTIONS YOU RECEIVED FROM YOUR SURGEON'S OFFICE!!!  Do not eat food after Midnight the night prior to your surgery/procedure.  After Midnight you may have the following liquids until  11:00 AM DAY OF SURGERY  Clear Liquid Diet Water Black Coffee (sugar ok, NO MILK/CREAM OR CREAMERS)  Tea (sugar ok, NO MILK/CREAM OR CREAMERS) regular and decaf                             Plain Jell-O   with no fruit (NO RED)                                           Fruit ices (not with fruit pulp, NO RED)                                     Popsicles (NO RED)                                                                  Juice: NO CITRUS JUICES: only apple, WHITE grape, WHITE cranberry Sports drinks like Gatorade or Powerade (NO RED)                Oral Hygiene is also important to reduce your risk of infection.        Remember - BRUSH YOUR TEETH THE MORNING OF SURGERY WITH YOUR REGULAR TOOTHPASTE  Do NOT smoke after Midnight  the night before surgery.  STOP TAKING all Vitamins, Herbs and supplements 1 week before your surgery.   Take ONLY these medicines the morning of surgery with A SIP OF WATER:   Famotidine and pantoprazole if needed.   You may not have any metal on your body including  jewelry, and body piercing  Do not wear  lotions, powders, cologne, or deodorant  Men may shave face and neck.  Contacts, Hearing Aids, dentures or bridgework may not be worn into surgery. DENTURES WILL BE REMOVED PRIOR TO SURGERY PLEASE DO NOT APPLY "Poly grip" OR ADHESIVES!!!  Patients discharged on the day of surgery will not be allowed to drive home.  Someone NEEDS to stay with you for the first 24 hours after anesthesia.  Do not bring your home medications to the hospital. The Pharmacy will dispense medications listed on your medication list to you during your admission in the Hospital.  Special Instructions: Bring a copy of your healthcare power of attorney and living will documents the day of surgery, if you wish to have them scanned into your [facility] Records- EPIC  Please read over the following fact sheets you were given: IF YOU HAVE QUESTIONS ABOUT YOUR PRE-OP INSTRUCTIONS, PLEASE CALL [phone]   [facility] - Preparing for Surgery Before surgery, you can play an important role.  Because skin is not sterile, your skin needs to be as free of germs as possible.  You  can reduce the number of germs on your skin by washing with CHG (chlorahexidine gluconate) soap before surgery.  CHG is an antiseptic cleaner which kills germs and bonds with the skin to continue killing germs even after washing. Please DO NOT use if you have an allergy to CHG or antibacterial soaps.  If your skin becomes reddened/irritated stop using the CHG and inform your nurse when you arrive at Short Stay. Do not shave (including legs and underarms) for at least 48 hours prior to the first CHG shower.  You may shave your face/neck.  Please follow these instructions carefully:  1.  Shower with CHG Soap the night before surgery and the  morning of surgery.  2.  If you choose to wash your hair, wash your hair first as usual with your normal  shampoo.  3.  After you shampoo, rinse your hair and body thoroughly to remove the shampoo.                             4.  Use CHG as you would any other liquid soap.  You can apply chg directly to the skin and wash.  Gently with a scrungie or clean washcloth.  5.  Apply the CHG Soap to your body ONLY FROM THE NECK DOWN.   Do not use on face/ open                           Wound or open sores. Avoid contact with eyes, ears mouth and genitals (private parts).                       Wash face,  Genitals (private parts) with your normal soap.             6.  Wash thoroughly, paying special attention to the area where your  surgery  will be performed.  7.  Thoroughly rinse your body with warm water from  the neck down.  8.  DO NOT shower/wash with your normal soap after using and rinsing off the CHG Soap.            9.  Pat yourself dry with a clean towel.            10.  Wear clean pajamas.            11.  Place clean sheets on your bed the night of your first shower and do not  sleep with pets.  ON THE DAY OF SURGERY : Do not apply any lotions/deodorants the morning of surgery.  Please wear clean clothes to the hospital/surgery center.     FAILURE TO  FOLLOW THESE INSTRUCTIONS MAY RESULT IN THE CANCELLATION OF YOUR SURGERY  PATIENT SIGNATURE_________________________________  NURSE SIGNATURE__________________________________  ________________________________________________________________________

## 2024-02-15 NOTE — Progress Notes (Signed)
 COVID Vaccine received:  []  No [x]  Yes Date of any COVID positive Test in last 90 days:  PCP - Charlene Conners, MD at [facility]  [phone]  Cardiologist - Dorothye Gathers, MD  (LOV 03-06-17)  Chest x-ray - 02-14-2014  2v  Epic EKG -  03-06-2017   will repeat Stress Test -  ECHO - 01-23-2013  Epic Cardiac Cath -   Pacemaker / ICD device [x]  No []  Yes   Spinal Cord Stimulator:[x]  No []  Yes       History of Sleep Apnea? [x]  No []  Yes   CPAP used?- [x]  No []  Yes    Does the patient monitor blood sugar?   [x]  N/A   []  No []  Yes  Patient has: [x]  NO Hx DM   []  Pre-DM   []  DM1  []   DM2  Blood Thinner / Instructions:  none Aspirin Instructions:  none  ERAS Protocol Ordered: []  No  [x]  Yes PRE-SURGERY []  ENSURE  []  G2   [x]  No Drink Ordered Patient is to be NPO after: 1100  Dental hx: []  Dentures:  []  N/A      []  Bridge or Partial:                   []  Loose or Damaged teeth:   Comments:   Activity level: Patient is able / unable to climb a flight of stairs without difficulty; []  No CP  []  No SOB, but would have ___   Patient can / can not perform ADLs without assistance.   Anesthesia review: GERD, Thrombocytopenia, Atrial bigeminy,  HTN,  ?LFTs. HOH-    Patient denies shortness of breath, fever, cough and chest pain at PAT appointment.  Patient verbalized understanding and agreement to the Pre-Surgical Instructions that were given to them at this PAT appointment. Patient was also educated of the need to review these PAT instructions again prior to his surgery.I reviewed the appropriate phone numbers to call if they have any and questions or concerns.

## 2024-02-16 ENCOUNTER — Encounter (HOSPITAL_COMMUNITY): Payer: Self-pay

## 2024-02-16 ENCOUNTER — Other Ambulatory Visit: Payer: Self-pay

## 2024-02-16 ENCOUNTER — Encounter (HOSPITAL_COMMUNITY)
Admission: RE | Admit: 2024-02-16 | Discharge: 2024-02-16 | Disposition: A | Discharge status: Home / Self Care | Source: Ambulatory Visit | Attending: Surgery | Admitting: Surgery

## 2024-02-16 VITALS — BP 159/79 | HR 58 | Temp 98.8°F | Resp 16 | Ht 72.0 in | Wt 190.0 lb

## 2024-02-16 DIAGNOSIS — I10 Essential (primary) hypertension: Secondary | ICD-10-CM

## 2024-02-16 DIAGNOSIS — Z01812 Encounter for preprocedural laboratory examination: Secondary | ICD-10-CM | POA: Diagnosis present

## 2024-02-16 DIAGNOSIS — Z01810 Encounter for preprocedural cardiovascular examination: Secondary | ICD-10-CM | POA: Diagnosis present

## 2024-02-16 DIAGNOSIS — R001 Bradycardia, unspecified: Secondary | ICD-10-CM | POA: Diagnosis not present

## 2024-02-16 DIAGNOSIS — Z01818 Encounter for other preprocedural examination: Secondary | ICD-10-CM

## 2024-02-16 DIAGNOSIS — R7989 Other specified abnormal findings of blood chemistry: Secondary | ICD-10-CM

## 2024-02-16 DIAGNOSIS — I498 Other specified cardiac arrhythmias: Secondary | ICD-10-CM | POA: Diagnosis not present

## 2024-02-16 HISTORY — DX: Pneumonia, unspecified organism: J18.9

## 2024-02-16 HISTORY — DX: Malignant (primary) neoplasm, unspecified: C80.1

## 2024-02-16 HISTORY — DX: Other specified abnormal findings of blood chemistry: R79.89

## 2024-02-16 HISTORY — DX: Unspecified osteoarthritis, unspecified site: M19.90

## 2024-02-16 HISTORY — DX: Unspecified hearing loss, unspecified ear: H91.90

## 2024-02-16 HISTORY — DX: Thrombocytopenia, unspecified: D69.6

## 2024-02-16 LAB — COMPREHENSIVE METABOLIC PANEL WITH GFR
ALT: 22 U/L (ref 0–44)
AST: 23 U/L (ref 15–41)
Albumin: 4.5 g/dL (ref 3.5–5.0)
Alkaline Phosphatase: 73 U/L (ref 38–126)
Anion gap: 11 (ref 5–15)
BUN: 23 mg/dL (ref 8–23)
CO2: 25 mmol/L (ref 22–32)
Calcium: 9.2 mg/dL (ref 8.9–10.3)
Chloride: 100 mmol/L (ref 98–111)
Creatinine, Ser: 1.26 mg/dL — ABNORMAL HIGH (ref 0.61–1.24)
GFR, Estimated: 58 mL/min — ABNORMAL LOW (ref >60)
Glucose, Bld: 103 mg/dL — ABNORMAL HIGH (ref 70–99)
Potassium: 3.8 mmol/L (ref 3.5–5.1)
Sodium: 136 mmol/L (ref 135–145)
Total Bilirubin: 0.7 mg/dL (ref 0.0–1.2)
Total Protein: 7.8 g/dL (ref 6.5–8.1)

## 2024-02-16 LAB — CBC
HCT: 42.3 % (ref 39.0–52.0)
Hemoglobin: 13.8 g/dL (ref 13.0–17.0)
MCH: 28.6 pg (ref 26.0–34.0)
MCHC: 32.6 g/dL (ref 30.0–36.0)
MCV: 87.8 fL (ref 80.0–100.0)
Platelets: 191 10*3/uL (ref 150–400)
RBC: 4.82 MIL/uL (ref 4.22–5.81)
RDW: 13.3 % (ref 11.5–15.5)
WBC: 8.1 10*3/uL (ref 4.0–10.5)
nRBC: 0 % (ref 0.0–0.2)

## 2024-02-18 ENCOUNTER — Encounter (HOSPITAL_COMMUNITY): Payer: Self-pay | Admitting: Surgery

## 2024-02-18 DIAGNOSIS — R590 Localized enlarged lymph nodes: Secondary | ICD-10-CM | POA: Diagnosis present

## 2024-02-18 NOTE — H&P (Signed)
 REFERRING PHYSICIAN: Charlene Conners, MD  PROVIDER: Myrna Vonseggern Arcola Kocher, MD   Chief Complaint: New Consultation (Thyroid  nodule, cervical lymph nodes)  History of Present Illness:  Patient is referred by his primary care physician, Dr. Charlene Conners, for surgical evaluation and recommendations regarding newly diagnosed right thyroid  nodule and right posterior cervical lymphadenopathy. Patient had developed pain involving the right neck and right shoulder which appears to be neurologic in origin. It is somewhat positional as he turns the head and neck. Patient had noted a nodular mass in the right posterior neck which on examination proved to be posterior cervical lymphadenopathy. This has been present for approximately 6 months. Patient underwent ultrasound examination on November 25, 2023. This demonstrated a solitary mass in the right lobe of the thyroid  measuring 4.1 x 3.2 x 2.5 cm. It was felt to be mildly suspicious and fine-needle aspiration biopsy was recommended. Also noted were borderline enlarged right cervical lymph nodes measuring approximately 8 or 9 mm in size with cortical thickening. There was no lymphadenopathy on the left side. Patient underwent ultrasound-guided fine-needle aspiration biopsy of the nodule in the right thyroid  lobe. This returned as a Hurthle cell lesion, Bethesda category IV. It was submitted for molecular genetic testing with AFIRMA, and returned with a result of benign. Patient is now referred for surgical assessment. He has no prior history of thyroid  disease. He has never been on thyroid  medication. 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 There is no family history of thyroid  disease. Patient is accompanied by his wife. 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  Review of Systems: A complete review of systems was obtained from  the patient. I have reviewed this information and discussed as appropriate with the patient. See HPI as well for other ROS.  Review of Systems  Constitutional: Negative.  HENT: Negative.  Eyes: Negative.  Respiratory: Negative. Negative for shortness of breath.  Cardiovascular: Negative.  Gastrointestinal: Negative.  Genitourinary: Negative.  Musculoskeletal: Negative.  Skin: Negative.  Neurological: Positive for tingling. Negative for tremors.  Right shoulder pain  Endo/Heme/Allergies: Negative.  Psychiatric/Behavioral: Negative.    Medical History: Past Medical History:  Diagnosis Date  GERD (gastroesophageal reflux disease)  History of cancer   Patient Active Problem List  Diagnosis  Right thyroid  nodule  Lymphadenopathy, posterior cervical   Past Surgical History:  Procedure Laterality Date  ARTHROSCOPIC ROTATOR CUFF REPAIR Bilateral    Allergies  Allergen Reactions  Heparin  Other (See Comments)  Vascular issues   Current Outpatient Medications on File Prior to Visit  Medication Sig Dispense Refill  famotidine (PEPCID) 20 MG tablet 1 tablet at bedtime as needed Orally Once a day for 30 days  pantoprazole (PROTONIX) 40 MG DR tablet Take 1 tablet by mouth 2 (two) times daily   No current facility-administered medications on file prior to visit.   Family History  Problem Relation Age of Onset  Coronary Artery Disease (Blocked arteries around heart) Mother  Skin cancer Father  Colon cancer Brother    Social History   Tobacco Use  Smoking Status Never  Smokeless Tobacco Never    Social History   Socioeconomic History  Marital status: Married  Tobacco Use  Smoking status: Never  Smokeless tobacco: Never  Vaping Use  Vaping status: Never Used  Substance and Sexual Activity  Alcohol use: Never  Drug use: Never  Social Drivers of Health   Housing Stability: Unknown (12/26/2023)  Housing Stability Vital Sign  Homeless in the Last Year: No    Objective:   Vitals:  BP: 126/74  Pulse: 50  Temp: 36.9 C (98.5 F)  SpO2: 99%  Weight: 89 kg (196 lb 3.2 oz)  Height: 182.9 cm (6')  PainSc: 0-No pain   Body mass index is 26.61 kg/m.  Physical Exam   GENERAL APPEARANCE Comfortable, no acute issues Development: normal Gross deformities: none  SKIN Rash, lesions, ulcers: none Induration, erythema: none Nodules: none palpable  EYES Conjunctiva and lids: normal Pupils: equal  EARS, NOSE, MOUTH, THROAT External ears: no lesion or deformity External nose: no lesion or deformity Hearing: grossly normal  NECK Symmetric: yes Trachea: midline Thyroid : Left thyroid  lobe is normal to palpation. Right thyroid  lobe has a smooth relatively firm central nodule which extends slightly beneath the clavicle and is best appreciated with swallowing maneuver. It is nontender.  CHEST/CV Not assessed  ABDOMEN Not assessed  GENITOURINARY/RECTAL Not assessed  MUSCULOSKELETAL Station and gait: normal Digits and nails: no clubbing or cyanosis Muscle strength: grossly normal all extremities Deformity: none  LYMPHATIC Cervical: There is no anterior cervical lymphadenopathy bilaterally. In the right posterior cervical chain there are at least 2 relatively firm lymph nodes each measuring approximately 1 cm in size, mobile, and nontender. Supraclavicular: none palpable  PSYCHIATRIC Oriented to person, place, and time: yes Mood and affect: normal for situation Judgment and insight: appropriate for situation   Assessment and Plan:   Right thyroid  nodule Lymphadenopathy, posterior cervical  Patient is referred by his primary care physician, Dr. Charlene Conners, for surgical evaluation and management of a newly diagnosed right thyroid  nodule and right cervical lymphadenopathy.  Patient provided with a copy of "The Thyroid  Book: Medical and Surgical Treatment of Thyroid  Problems", published by [employer], 16 pages. Book  reviewed and explained to patient during visit today.  Today we reviewed his clinical history. We reviewed his ultrasound report. We reviewed his cytopathology report. We reviewed the results of his molecular genetic testing.  Patient really presents with 3 problems. There is the pain which radiates from the neck to the right shoulder which I believe is neurologic in origin and is not related to either the right thyroid  nodule or the cervical lymph nodes which are palpable in the posterior cervical chain on the right. I believe the patient would best be evaluated by neurosurgery and will likely require an MRI of both the cervical spine and the soft tissues of the neck in order to evaluate for a possible etiology for his discomfort. I have given him the names of 2 neurosurgeons whom I think would be good to evaluate this problem. I will ask his primary care physician, Dr. Teofilo Fellers, his opinion and he will be the one to make referral to neurosurgery if he believes this is appropriate.  The right thyroid  nodule is essentially an incidental finding. The patient is asymptomatic. He has no compressive symptoms. 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 I would recommend continued follow-up. I will plan to see the patient back in 1 year with a repeat ultrasound and TSH level. At this time he does not require thyroid  surgery.  The third problem is the right posterior cervical lymph nodes. Clinically these are abnormal. We are going to asked radiology to repeat a soft tissue ultrasound of the neck to evaluate  these lymph nodes again and to possibly perform a fine-needle aspiration biopsy. Alternatively, I discussed the possibility of excisional lymph node biopsy with the patient. We will try the radiologic route first.  Patient will undergo ultrasound and potentially ultrasound-guided biopsy of the right  posterior cervical lymph nodes. We will contact the patient with the results when they are available and make plans for further management at that time.  Oralee Billow, MD [facility] practice Office: [phone]

## 2024-02-19 ENCOUNTER — Ambulatory Visit (HOSPITAL_BASED_OUTPATIENT_CLINIC_OR_DEPARTMENT_OTHER): Admitting: Certified Registered"

## 2024-02-19 ENCOUNTER — Encounter (HOSPITAL_COMMUNITY)
Admission: RE | Disposition: A | Discharge status: Home / Self Care | Payer: Self-pay | Source: Home / Self Care | Attending: Surgery

## 2024-02-19 ENCOUNTER — Ambulatory Visit (HOSPITAL_COMMUNITY): Payer: Self-pay | Admitting: Physician Assistant

## 2024-02-19 ENCOUNTER — Ambulatory Visit (HOSPITAL_COMMUNITY)
Admission: RE | Admit: 2024-02-19 | Discharge: 2024-02-19 | Disposition: A | Discharge status: Home / Self Care | Attending: Surgery | Admitting: Surgery

## 2024-02-19 ENCOUNTER — Other Ambulatory Visit: Payer: Self-pay

## 2024-02-19 ENCOUNTER — Encounter (HOSPITAL_COMMUNITY): Payer: Self-pay | Admitting: Surgery

## 2024-02-19 DIAGNOSIS — C8101 Nodular lymphocyte predominant Hodgkin lymphoma, lymph nodes of head, face, and neck: Secondary | ICD-10-CM | POA: Diagnosis not present

## 2024-02-19 DIAGNOSIS — R590 Localized enlarged lymph nodes: Secondary | ICD-10-CM | POA: Diagnosis present

## 2024-02-19 DIAGNOSIS — Z85828 Personal history of other malignant neoplasm of skin: Secondary | ICD-10-CM | POA: Diagnosis not present

## 2024-02-19 DIAGNOSIS — K219 Gastro-esophageal reflux disease without esophagitis: Secondary | ICD-10-CM | POA: Diagnosis not present

## 2024-02-19 DIAGNOSIS — I10 Essential (primary) hypertension: Secondary | ICD-10-CM | POA: Insufficient documentation

## 2024-02-19 DIAGNOSIS — Z87891 Personal history of nicotine dependence: Secondary | ICD-10-CM | POA: Diagnosis not present

## 2024-02-19 DIAGNOSIS — R591 Generalized enlarged lymph nodes: Secondary | ICD-10-CM | POA: Diagnosis not present

## 2024-02-19 HISTORY — PX: LYMPH NODE BIOPSY: SHX201

## 2024-02-19 SURGERY — LYMPH NODE BIOPSY
Anesthesia: General | Laterality: Right

## 2024-02-19 MED ORDER — BUPIVACAINE HCL (PF) 0.25 % IJ SOLN
INTRAMUSCULAR | Status: DC | PRN
  Administered 2024-02-19: 10 mL

## 2024-02-19 MED ORDER — ONDANSETRON HCL 4 MG/2ML IJ SOLN
INTRAMUSCULAR | Status: DC | PRN
  Administered 2024-02-19: 4 mg via INTRAVENOUS

## 2024-02-19 MED ORDER — CHLORHEXIDINE GLUCONATE CLOTH 2 % EX PADS: 6.0000 | MEDICATED_PAD | Freq: Once | CUTANEOUS | Status: DC

## 2024-02-19 MED ORDER — ROCURONIUM BROMIDE 10 MG/ML (PF) SYRINGE
PREFILLED_SYRINGE | INTRAVENOUS | Status: DC | PRN
  Administered 2024-02-19: 40 mg via INTRAVENOUS

## 2024-02-19 MED ORDER — DEXAMETHASONE SODIUM PHOSPHATE 10 MG/ML IJ SOLN
INTRAMUSCULAR | Status: DC | PRN
  Administered 2024-02-19: 8 mg via INTRAVENOUS

## 2024-02-19 MED ORDER — CHLORHEXIDINE GLUCONATE 0.12 % MT SOLN
15.0000 mL | Freq: Once | OROMUCOSAL | Status: AC
  Administered 2024-02-19: 15 mL via OROMUCOSAL

## 2024-02-19 MED ORDER — STERILE WATER FOR IRRIGATION IR SOLN
Status: DC | PRN
  Administered 2024-02-19: 1000 mL

## 2024-02-19 MED ORDER — LIDOCAINE HCL (PF) 2 % IJ SOLN
INTRAMUSCULAR | Status: DC | PRN
  Administered 2024-02-19: 80 mg via INTRADERMAL

## 2024-02-19 MED ORDER — SUGAMMADEX SODIUM 200 MG/2ML IV SOLN
INTRAVENOUS | Status: DC | PRN
  Administered 2024-02-19: 200 mg via INTRAVENOUS

## 2024-02-19 MED ORDER — BUPIVACAINE-EPINEPHRINE (PF) 0.25% -1:200000 IJ SOLN
INTRAMUSCULAR | Status: AC
  Filled 2024-02-19: qty 30

## 2024-02-19 MED ORDER — TRAMADOL HCL 50 MG PO TABS: 50.0000 mg | ORAL_TABLET | Freq: Four times a day (QID) | ORAL | 0 refills | Status: DC | PRN

## 2024-02-19 MED ORDER — FENTANYL CITRATE (PF) 100 MCG/2ML IJ SOLN
INTRAMUSCULAR | Status: AC
  Filled 2024-02-19: qty 2

## 2024-02-19 MED ORDER — BUPIVACAINE HCL (PF) 0.25 % IJ SOLN
INTRAMUSCULAR | Status: AC
  Filled 2024-02-19: qty 30

## 2024-02-19 MED ORDER — FENTANYL CITRATE (PF) 100 MCG/2ML IJ SOLN
INTRAMUSCULAR | Status: DC | PRN
  Administered 2024-02-19: 50 ug via INTRAVENOUS
  Administered 2024-02-19 (×2): 25 ug via INTRAVENOUS

## 2024-02-19 MED ORDER — ORAL CARE MOUTH RINSE: 15.0000 mL | Freq: Once | OROMUCOSAL | Status: AC

## 2024-02-19 MED ORDER — LACTATED RINGERS IV SOLN: INTRAVENOUS | Status: DC

## 2024-02-19 MED ORDER — CEFAZOLIN SODIUM-DEXTROSE 2-4 GM/100ML-% IV SOLN
2.0000 g | INTRAVENOUS | Status: AC
  Administered 2024-02-19: 2 g via INTRAVENOUS
  Filled 2024-02-19: qty 100

## 2024-02-19 MED ORDER — EPHEDRINE SULFATE-NACL 50-0.9 MG/10ML-% IV SOSY
PREFILLED_SYRINGE | INTRAVENOUS | Status: DC | PRN
  Administered 2024-02-19: 10 mg via INTRAVENOUS

## 2024-02-19 MED ORDER — PROPOFOL 10 MG/ML IV BOLUS
INTRAVENOUS | Status: DC | PRN
Start: 2024-02-19 — End: 2024-02-19
  Administered 2024-02-19: 200 mg via INTRAVENOUS

## 2024-02-19 MED ORDER — 0.9 % SODIUM CHLORIDE (POUR BTL) OPTIME
TOPICAL | Status: DC | PRN
  Administered 2024-02-19: 1000 mL

## 2024-02-19 SURGICAL SUPPLY — 36 items
BAG COUNTER SPONGE SURGICOUNT (BAG) IMPLANT
BLADE SURG 15 STRL LF DISP TIS (BLADE) ×2 IMPLANT
BLADE SURG SZ10 CARB STEEL (BLADE) IMPLANT
CANISTER SUCT 3000ML PPV (MISCELLANEOUS) IMPLANT
CHLORAPREP W/TINT 26 (MISCELLANEOUS) ×4 IMPLANT
CLIP TI MEDIUM 6 (CLIP) IMPLANT
CLIP TI WIDE RED SMALL 6 (CLIP) IMPLANT
COVER BACK TABLE 60X90IN (DRAPES) ×2 IMPLANT
COVER MAYO STAND STRL (DRAPES) ×2 IMPLANT
DERMABOND ADVANCED .7 DNX12 (GAUZE/BANDAGES/DRESSINGS) IMPLANT
DRAPE LAPAROTOMY T 98X78 PEDS (DRAPES) ×2 IMPLANT
DRAPE UTILITY XL STRL (DRAPES) ×1 IMPLANT
ELECT COATED BLADE 2.86 ST (ELECTRODE) ×2 IMPLANT
ELECT REM PT RETURN 15FT ADLT (MISCELLANEOUS) ×2 IMPLANT
GLOVE BIOGEL PI IND STRL 8 (GLOVE) ×2 IMPLANT
GLOVE SURG SYN 7.5 E (GLOVE) ×2 IMPLANT
GLOVE SURG SYN 7.5 PF PI (GLOVE) ×4 IMPLANT
GOWN STRL REUS W/ TWL XL LVL3 (GOWN DISPOSABLE) ×1 IMPLANT
GOWN STRL REUS W/TWL 2XL LVL3 (GOWN DISPOSABLE) ×1 IMPLANT
KIT BASIN OR (CUSTOM PROCEDURE TRAY) ×1 IMPLANT
KIT TURNOVER KIT A (KITS) IMPLANT
NDL HYPO 25X1 1.5 SAFETY (NEEDLE) ×1 IMPLANT
NEEDLE HYPO 25X1 1.5 SAFETY (NEEDLE) ×1 IMPLANT
NS IRRIG 1000ML POUR BTL (IV SOLUTION) ×2 IMPLANT
PENCIL SMOKE EVACUATOR (MISCELLANEOUS) IMPLANT
STAPLER SKIN PROX WIDE 3.9 (STAPLE) IMPLANT
SUT MON AB 3-0 SH27 (SUTURE) IMPLANT
SUT MON AB 5-0 PS2 18 (SUTURE) ×2 IMPLANT
SUT SILK 3 0 SH 30 (SUTURE) IMPLANT
SUT VIC AB 3-0 SH 27X BRD (SUTURE) ×1 IMPLANT
SUT VIC AB 4-0 BRD 54 (SUTURE) IMPLANT
SYR BULB EAR ULCER 3OZ GRN STR (SYRINGE) IMPLANT
SYR CONTROL 10ML LL (SYRINGE) ×1 IMPLANT
TOWEL OR 17X26 10 PK STRL BLUE (TOWEL DISPOSABLE) ×4 IMPLANT
TUBING CONNECTING 10 (TUBING) IMPLANT
YANKAUER SUCT BULB TIP NO VENT (SUCTIONS) IMPLANT

## 2024-02-19 NOTE — Transfer of Care (Signed)
 Immediate Anesthesia Transfer of Care Note  Patient: Geoffrey Duran  Procedure(s) Performed: LYMPH NODE BIOPSY (Right)  Patient Location: PACU  Anesthesia Type:General  Level of Consciousness: awake, alert , oriented, and patient cooperative  Airway & Oxygen Therapy: Patient Spontanous Breathing and Patient connected to face mask oxygen  Post-op Assessment: Report given to RN and Post -op Vital signs reviewed and stable  Post vital signs: Reviewed and stable  Last Vitals:  Vitals Value Taken Time  BP 172/89 02/19/24 1432  Temp    Pulse 69 02/19/24 1434  Resp 12 02/19/24 1434  SpO2 90 % 02/19/24 1434  Vitals shown include unfiled device data.  Last Pain:  Vitals:   02/19/24 1043  TempSrc:   PainSc: 0-No pain         Complications: No notable events documented.

## 2024-02-19 NOTE — Anesthesia Preprocedure Evaluation (Signed)
 Anesthesia Evaluation  Patient identified by MRN, date of birth, ID band Patient awake    Reviewed: Allergy & Precautions, H&P , NPO status , Patient's Chart, lab work & pertinent test results  Airway Mallampati: II   Neck ROM: full    Dental   Pulmonary former smoker   breath sounds clear to auscultation       Cardiovascular hypertension, + dysrhythmias  Rhythm:regular Rate:Normal     Neuro/Psych    GI/Hepatic ,GERD  ,,  Endo/Other    Renal/GU      Musculoskeletal  (+) Arthritis ,    Abdominal   Peds  Hematology   Anesthesia Other Findings   Reproductive/Obstetrics                             Anesthesia Physical Anesthesia Plan  ASA: 2  Anesthesia Plan: General   Post-op Pain Management:    Induction: Intravenous  PONV Risk Score and Plan: 2 and Ondansetron , Dexamethasone and Treatment may vary due to age or medical condition  Airway Management Planned: Oral ETT  Additional Equipment:   Intra-op Plan:   Post-operative Plan: Extubation in OR  Informed Consent: I have reviewed the patients History and Physical, chart, labs and discussed the procedure including the risks, benefits and alternatives for the proposed anesthesia with the patient or authorized representative who has indicated his/her understanding and acceptance.     Dental advisory given  Plan Discussed with: CRNA, Anesthesiologist and Surgeon  Anesthesia Plan Comments:        Anesthesia Quick Evaluation

## 2024-02-19 NOTE — Anesthesia Procedure Notes (Signed)
 Procedure Name: Intubation Date/Time: 02/19/2024 1:40 PM  Performed by: Keary Passey, CRNAPre-anesthesia Checklist: Patient identified, Emergency Drugs available, Suction available and Patient being monitored Patient Re-evaluated:Patient Re-evaluated prior to induction Oxygen Delivery Method: Circle system utilized Preoxygenation: Pre-oxygenation with 100% oxygen Induction Type: IV induction Ventilation: Mask ventilation without difficulty Laryngoscope Size: Mac and 4 Grade View: Grade II Tube type: Oral Tube size: 7.5 mm Number of attempts: 1 Airway Equipment and Method: Stylet and Oral airway Placement Confirmation: ETT inserted through vocal cords under direct vision, positive ETCO2 and breath sounds checked- equal and bilateral Secured at: 22 cm Tube secured with: Tape Dental Injury: Teeth and Oropharynx as per pre-operative assessment

## 2024-02-19 NOTE — Discharge Instructions (Addendum)
  [facility] -- DISCHARGE INSTRUCTIONS  REMINDER:   Carry a list of your medications and allergies with you at all times  Call your pharmacy at least 1 week in advance to refill prescriptions  Do not mix any prescribed pain medicine with alcohol  Do not drive any motor vehicles while taking pain medication  Take medications with food unless otherwise directed  Follow-up appointments (date to return to physician): Please call [phone] to confirm your follow up appointment with your surgeon.  Call your Surgeon if you have:  Temperature greater than 101.0  Persistent nausea and vomiting  Severe uncontrolled pain  Redness, tenderness, or signs of infection (pain, swelling, redness, odor or green/yellow discharge around the site)  Difficulty breathing, headache or visual disturbances  Hives  Persistent dizziness or light-headedness  Any other questions or concerns you may have after discharge  In an emergency, call 911 or go to an Emergency Department at a nearby hospital.  Diet: Begin with liquids, and if they are tolerated, resume your usual diet.  Avoid spicy, greasy or heavy foods.  If you have nausea or vomiting, go back to liquids.  If you cannot keep liquids down, call your doctor.  Avoid alcohol consumption while on prescription pain medications. Good nutrition promotes healing. Increase fiber and fluids.   ADDITIONAL INSTRUCTIONS: Leave Dermabond in place for 7-10 days.  May shower immediately.  Use ice pack first 2-3 days and then as needed.  [facility] Office: [phone]

## 2024-02-19 NOTE — Interval H&P Note (Signed)
 History and Physical Interval Note:  02/19/2024 1:10 PM  Geoffrey Duran  has presented today for surgery, with the diagnosis of LYMPYHADENOPATHY.  The various methods of treatment have been discussed with the patient and family. After consideration of risks, benefits and other options for treatment, the patient has consented to    Procedure(s) with comments: LYMPH NODE BIOPSY (Right) - Excisional biopsy right posterior cervical lymph nodes as a surgical intervention.    The patient's history has been reviewed, patient examined, no change in status, stable for surgery.  I have reviewed the patient's chart and labs.  Questions were answered to the patient's satisfaction.    Oralee Billow, MD [facility] practice Office: [phone]   Oralee Billow

## 2024-02-19 NOTE — Op Note (Signed)
 Operative Note  Pre-operative Diagnosis:  right posterior cervical lymphadenopathy  Post-operative Diagnosis:  same  Surgeon:  Oralee Billow, MD  Assistant:  none   Procedure:  right posterior cervical excisional lymph node biopsy (3 nodes)  Anesthesia:  general  Estimated Blood Loss:  10 cc  Drains: none         Specimen: lymph nodes to pathology fresh  Indications:  Patient is referred by his primary care physician, Dr. Charlene Conners, for surgical evaluation and recommendations regarding newly diagnosed right thyroid  nodule and right posterior cervical lymphadenopathy. Patient had developed pain involving the right neck and right shoulder which appears to be neurologic in origin. It is somewhat positional as he turns the head and neck. Patient had noted a nodular mass in the right posterior neck which on examination proved to be posterior cervical lymphadenopathy. This has been present for approximately 6 months. Patient underwent ultrasound examination on November 25, 2023. This demonstrated a solitary mass in the right lobe of the thyroid  measuring 4.1 x 3.2 x 2.5 cm. It was felt to be mildly suspicious and fine-needle aspiration biopsy was recommended. Also noted were borderline enlarged right cervical lymph nodes measuring approximately 8 or 9 mm in size with cortical thickening. There was no lymphadenopathy on the left side. Patient underwent ultrasound-guided fine-needle aspiration biopsy of the nodule in the right thyroid  lobe. This returned as a Hurthle cell lesion, Bethesda category IV. It was submitted for molecular genetic testing with AFIRMA, and returned with a result of benign. Patient is now referred for surgical assessment. He has no prior history of thyroid  disease. He has never been on thyroid  medication. 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 There is no  family history of thyroid  disease. Patient is accompanied by his wife. 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   Procedure:  The patient was seen in the pre-op holding area. The risks, benefits, complications, treatment options, and expected outcomes were previously discussed with the patient. The patient agreed with the proposed plan and has signed the informed consent form.  The patient was brought to the operating room by the surgical team, identified as Geoffrey Duran and the procedure verified. A "time out" was completed and the above information confirmed.  Following induction of general anesthesia, the patient is rotated slightly to his left.  The right neck was then prepped and draped in the usual aseptic fashion.  After ascertaining that an adequate level of anesthesia been achieved, an incision was made over the palpable lymph nodes in the lower right anterior cervical lymph node chain.  Dissection was carried through subcutaneous tissues.  Hemostasis was achieved with the electrocautery.  The palpable lymph node was identified and gently dissected out and elevated.  There were actually 3 lymph nodes, 2 larger and 1 smaller, and a cluster at this location.  They were gently excised using the electrocautery.  The main for vascular pedicle was divided between medium ligaclips with the electrocautery.  All 3 lymph nodes were then submitted fresh to pathology in saline for review.  Pathology was contacted by telephone.  Good hemostasis was achieved throughout the wound.  Deeper tissues were closed with interrupted 3-0 Vicryl sutures.  Platysma was closed with interrupted 3-0 Vicryl sutures.  Skin was closed with a running 5-0 Monocryl subcuticular suture.  Skin was anesthetized with local anesthetic.  Wound was  washed and dried and Dermabond was applied as dressing.  Patient tolerated the procedure well.  He was awakened from anesthesia and transferred to the recovery room in stable  condition.   Oralee Billow, MD [facility] Surgery Office: [phone]

## 2024-02-20 ENCOUNTER — Encounter (HOSPITAL_COMMUNITY): Payer: Self-pay | Admitting: Surgery

## 2024-02-20 NOTE — Anesthesia Postprocedure Evaluation (Signed)
 Anesthesia Post Note  Patient: Dominie Hallum  Procedure(s) Performed: LYMPH NODE BIOPSY (Right)     Patient location during evaluation: PACU Anesthesia Type: General Level of consciousness: awake and alert Pain management: pain level controlled Vital Signs Assessment: post-procedure vital signs reviewed and stable Respiratory status: spontaneous breathing, nonlabored ventilation, respiratory function stable and patient connected to nasal cannula oxygen Cardiovascular status: blood pressure returned to baseline and stable Postop Assessment: no apparent nausea or vomiting Anesthetic complications: no   No notable events documented.  Last Vitals:  Vitals:   02/19/24 1515 02/19/24 1522  BP: (!) 169/89 (!) 169/89  Pulse: 61 68  Resp: 11 16  Temp:    SpO2: 95% 96%    Last Pain:  Vitals:   02/19/24 1522  TempSrc:   PainSc: 0-No pain                 Tima Curet S

## 2024-02-22 ENCOUNTER — Encounter: Payer: Self-pay | Admitting: Surgery

## 2024-02-22 LAB — SURGICAL PATHOLOGY

## 2024-02-22 NOTE — Progress Notes (Signed)
 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  I have discussed with Dr. Eino Gravel from pathology.  Will call patient later this afternoon to discuss.  Will forward results to his primary care physician.  Oralee Billow, MD [facility] practice Office: [phone]

## 2024-03-08 ENCOUNTER — Encounter: Payer: Self-pay | Admitting: *Deleted

## 2024-03-08 NOTE — Progress Notes (Signed)
 Received referral for patient.  I called patient and confirmed that he can come to a New Patient appointment with [name], PA on Monday, Mar 11, 2024 at 0900.  I gave patient directions and information about what to expect with this appointment.  Patient repeated information back and expressed understanding.  Patient denied any further questions and expressed appreciation for phone call.

## 2024-03-11 ENCOUNTER — Inpatient Hospital Stay

## 2024-03-11 ENCOUNTER — Encounter: Payer: Self-pay | Admitting: Physician Assistant

## 2024-03-11 ENCOUNTER — Inpatient Hospital Stay: Attending: Physician Assistant | Admitting: Physician Assistant

## 2024-03-11 VITALS — BP 136/85 | HR 57 | Temp 97.7°F | Resp 20 | Ht 72.0 in | Wt 197.3 lb

## 2024-03-11 DIAGNOSIS — R600 Localized edema: Secondary | ICD-10-CM

## 2024-03-11 DIAGNOSIS — Z800 Family history of malignant neoplasm of digestive organs: Secondary | ICD-10-CM | POA: Insufficient documentation

## 2024-03-11 DIAGNOSIS — C8101 Nodular lymphocyte predominant Hodgkin lymphoma, lymph nodes of head, face, and neck: Secondary | ICD-10-CM | POA: Diagnosis not present

## 2024-03-11 DIAGNOSIS — I10 Essential (primary) hypertension: Secondary | ICD-10-CM | POA: Insufficient documentation

## 2024-03-11 DIAGNOSIS — Z85828 Personal history of other malignant neoplasm of skin: Secondary | ICD-10-CM | POA: Insufficient documentation

## 2024-03-11 DIAGNOSIS — Z809 Family history of malignant neoplasm, unspecified: Secondary | ICD-10-CM | POA: Diagnosis not present

## 2024-03-11 DIAGNOSIS — Z87891 Personal history of nicotine dependence: Secondary | ICD-10-CM | POA: Insufficient documentation

## 2024-03-11 DIAGNOSIS — K219 Gastro-esophageal reflux disease without esophagitis: Secondary | ICD-10-CM | POA: Insufficient documentation

## 2024-03-11 LAB — CBC WITH DIFFERENTIAL (CANCER CENTER ONLY)
Abs Immature Granulocytes: 0.04 10*3/uL (ref 0.00–0.07)
Basophils Absolute: 0.1 10*3/uL (ref 0.0–0.1)
Basophils Relative: 1 %
Eosinophils Absolute: 0.1 10*3/uL (ref 0.0–0.5)
Eosinophils Relative: 1 %
HCT: 39.6 % (ref 39.0–52.0)
Hemoglobin: 13.4 g/dL (ref 13.0–17.0)
Immature Granulocytes: 1 %
Lymphocytes Relative: 34 %
Lymphs Abs: 2.7 10*3/uL (ref 0.7–4.0)
MCH: 28.6 pg (ref 26.0–34.0)
MCHC: 33.8 g/dL (ref 30.0–36.0)
MCV: 84.6 fL (ref 80.0–100.0)
Monocytes Absolute: 2.2 10*3/uL — ABNORMAL HIGH (ref 0.1–1.0)
Monocytes Relative: 28 %
Neutro Abs: 2.7 10*3/uL (ref 1.7–7.7)
Neutrophils Relative %: 35 %
Platelet Count: 167 10*3/uL (ref 150–400)
RBC: 4.68 MIL/uL (ref 4.22–5.81)
RDW: 13.2 % (ref 11.5–15.5)
WBC Count: 7.7 10*3/uL (ref 4.0–10.5)
nRBC: 0 % (ref 0.0–0.2)

## 2024-03-11 LAB — CMP (CANCER CENTER ONLY)
ALT: 14 U/L (ref 0–44)
AST: 17 U/L (ref 15–41)
Albumin: 4.6 g/dL (ref 3.5–5.0)
Alkaline Phosphatase: 77 U/L (ref 38–126)
Anion gap: 7 (ref 5–15)
BUN: 20 mg/dL (ref 8–23)
CO2: 29 mmol/L (ref 22–32)
Calcium: 9.1 mg/dL (ref 8.9–10.3)
Chloride: 103 mmol/L (ref 98–111)
Creatinine: 1.24 mg/dL (ref 0.61–1.24)
GFR, Estimated: 60 mL/min — ABNORMAL LOW (ref >60)
Glucose, Bld: 90 mg/dL (ref 70–99)
Potassium: 3.8 mmol/L (ref 3.5–5.1)
Sodium: 139 mmol/L (ref 135–145)
Total Bilirubin: 0.7 mg/dL (ref 0.0–1.2)
Total Protein: 7.7 g/dL (ref 6.5–8.1)

## 2024-03-11 LAB — LACTATE DEHYDROGENASE: LDH: 180 U/L (ref 98–192)

## 2024-03-11 LAB — C-REACTIVE PROTEIN: CRP: 0.8 mg/dL (ref <1.0)

## 2024-03-11 LAB — SEDIMENTATION RATE: Sed Rate: 8 mm/h (ref 0–16)

## 2024-03-11 NOTE — Progress Notes (Signed)
 [facility] Telephone:([phone]) [phone]   [phone]  INITIAL CONSULT NOTE  Patient Care Team: Benedetta Bradley, MD as PCP - General (Internal Medicine)  CHIEF COMPLAINTS/PURPOSE OF CONSULTATION:  Nodular Lymphocyte Predominant Hodgkin's Lymphoma  ONCOLOGIC HISTORY: Presented to PCP, Dr. Charlene Conners with right neck and shoulder pain. Physical exam findings was notable for nodular mass in the right posterior cervical region.  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 Molecular genetic testing with AFIRMA, and returned with a result of benign  02/19/2024: Underwent excision of right posterior cervical lymph node. Pathology was consistent with nodular lymphocyte predoominant Hodgkin's lymphoma 03/11/2024: Establish care with [facility] Hematology/Oncology with Dr Amparo Balk.   HISTORY OF PRESENTING ILLNESS: Geoffrey Duran 78 y.o. male with medical history significant for hypertension, GERD, skin cancers (SCC and BCC) presents to the clinic for newly diagnosed Hodgkin's lymphoma. He is accompanied by his wife, Geoffrey Duran, for this visit.   On exam today, Mr. Hinojosa reports his energy levels have decreased recently but he can complete his ADLs on his own. He has a good appetite without any weight loss noted. He denies nausea, vomiting or bowel habit changes. He denies easy bruising or signs of active bleeding. He reports right neck and shoulder pain has significantly improved since undergoing surgical biopsy. He now experiences numbness around the right shoulder and right ear which is tolerable. He reports having night sweats for the past year which  results in his changing his clothes or pillow cover. He denies fevers, chills, shortness of breath, chest pain, cough, headaches or dizziness. He has no other complaints. Rest of the 10 point ROS is below.   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   Hypertension    Pneumonia    Premature atrial contraction    Thrombocytopenia (HCC)     SURGICAL HISTORY: Past Surgical History:  Procedure Laterality Date   bilateral rotator surgery     COLONOSCOPY WITH PROPOFOL  N/A 03/08/2016   Procedure: COLONOSCOPY WITH PROPOFOL ;  Surgeon: Garrett Kallman, MD;  Location: [facility];  Service: Endoscopy;  Laterality: N/A;   HERNIA REPAIR  1970   right inguinal hernia   LYMPH NODE BIOPSY Right 02/19/2024   Procedure: LYMPH NODE BIOPSY;  Surgeon: Oralee Billow, MD;  Location: [facility] ORS;  Service: General;  Laterality: Right;  Excisional biopsy right posterior cervical lymph nodes    SOCIAL HISTORY: Social History   Socioeconomic History   Marital status: Married    Spouse name: Not on file   Number of children: Not on file   Years of education: Not on file   Highest education level: Not on file  Occupational History   Not on file  Tobacco Use   Smoking status: Former   Smokeless tobacco: Former    Types: Chew   Tobacco comments:  Smoked 4-5 years, quit 55 years ago.   Vaping Use   Vaping status: Never Used  Substance and Sexual Activity   Alcohol use: No    Alcohol/week: 0.0 standard drinks of alcohol   Drug use: No   Sexual activity: Yes  Other Topics Concern   Not on file  Social History Narrative   Pt lives in [place] with spouse. 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   Social Drivers of  Corporate investment banker Strain: Not on file  Food Insecurity: No Food Insecurity (03/11/2024)   Hunger Vital Sign    Worried About Running Out of Food in the Last Year: Never true    Ran Out of Food in the Last Year: Never true  Transportation Needs: No Transportation Needs (03/11/2024)   PRAPARE - Administrator, Civil Service (Medical): No    Lack of Transportation (Non-Medical): No  Physical Activity: Not on file  Stress: Not on file  Social Connections: Not on file  Intimate Partner Violence: Not At Risk (03/11/2024)   Humiliation, Afraid, Rape, and Kick questionnaire    Fear of Current or Ex-Partner: No    Emotionally Abused: No    Physically Abused: No    Sexually Abused: No    FAMILY HISTORY: Family History  Problem Relation Age of Onset   Sudden death Mother        Ventricular Fibrillation at age 59   Colon cancer Brother    Cancer Brother    Heart attack Neg Hx    Stroke Neg Hx     ALLERGIES:  is allergic to heparin .  MEDICATIONS:  Current Outpatient Medications  Medication Sig Dispense Refill   famotidine (PEPCID) 20 MG tablet Take 20 mg by mouth 2 (two) times daily.     pantoprazole (PROTONIX) 40 MG tablet Take 40 mg by mouth daily as needed (acid reflux).     No current facility-administered medications for this visit.    REVIEW OF SYSTEMS:   Constitutional: ( - ) fevers, ( - )  chills , ( - ) night sweats Eyes: ( - ) blurriness of vision, ( - ) double vision, ( - ) watery eyes Ears, nose, mouth, throat, and face: ( - ) mucositis, ( - ) sore throat Respiratory: ( - ) cough, ( - ) dyspnea, ( - ) wheezes Cardiovascular: ( - ) palpitation, ( - ) chest discomfort, ( - ) lower extremity swelling Gastrointestinal:  ( - ) nausea, ( - ) heartburn, ( - ) change in bowel habits Skin: ( - ) abnormal skin rashes Lymphatics: ( - ) new lymphadenopathy, ( - ) easy bruising Neurological: ( - ) numbness, ( - ) tingling, ( - ) new  weaknesses Behavioral/Psych: ( - ) mood change, ( - ) new changes  All other systems were reviewed with the patient and are negative.  PHYSICAL EXAMINATION: ECOG PERFORMANCE STATUS: 1 - Symptomatic but completely ambulatory  Vitals:   03/11/24 0855  BP: 136/85  Pulse: (!) 57  Resp: 20  Temp: 97.7 F (36.5 C)  SpO2: 99%   Filed Weights   03/11/24 0855  Weight: 197 lb 4.8 oz (89.5 kg)    GENERAL: well appearing male in NAD  SKIN: skin color, texture, turgor are normal, no rashes or significant lesions EYES: conjunctiva are pink and non-injected, sclera clear OROPHARYNX: no exudate, no erythema; lips, buccal mucosa, and tongue normal  NECK: supple, non-tender LYMPH:  no palpable lymphadenopathy in the  axillary or supraclavicular lymph nodes. Post surgical scar of the right posterior cervical region without any signs of infection. Some fullness noted in left posterior cervical region.  LUNGS: clear to auscultation and percussion with normal breathing effort HEART: regular rate & rhythm and no murmurs and no lower extremity edema ABDOMEN: soft, non-tender, non-distended, normal bowel sounds. No hepatosplenomegaly.  Musculoskeletal: no cyanosis of digits and no clubbing  PSYCH: alert & oriented x 3, fluent speech NEURO: no focal motor/sensory deficits  LABORATORY DATA:  I have reviewed the data as listed    Latest Ref Rng & Units 02/16/2024    8:21 AM 02/10/2014    5:18 AM 02/09/2014    7:35 AM  CBC  WBC 4.0 - 10.5 K/uL 8.1  4.4  5.7   Hemoglobin 13.0 - 17.0 g/dL 29.5  28.4  13.2   Hematocrit 39.0 - 52.0 % 42.3  38.1  36.9   Platelets 150 - 400 K/uL 191  60  53        Latest Ref Rng & Units 02/16/2024    8:21 AM 02/10/2014    5:18 AM 02/09/2014    7:35 AM  CMP  Glucose 70 - 99 mg/dL 440  102  93   BUN 8 - 23 mg/dL 23  11  9    Creatinine 0.61 - 1.24 mg/dL 7.25  3.66  4.40   Sodium 135 - 145 mmol/L 136  142  140   Potassium 3.5 - 5.1 mmol/L 3.8  3.8  3.9   Chloride 98 -  111 mmol/L 100  105  105   CO2 22 - 32 mmol/L 25  23  23    Calcium 8.9 - 10.3 mg/dL 9.2  8.6  8.0   Total Protein 6.5 - 8.1 g/dL 7.8  6.4  6.1   Total Bilirubin 0.0 - 1.2 mg/dL 0.7  0.7  0.8   Alkaline Phos 38 - 126 U/L 73  218  158   AST 15 - 41 U/L 23  65  42   ALT 0 - 44 U/L 22  79  66      PATHOLOGY SURGICAL PATHOLOGY CASE: [id number] PATIENT: Geoffrey Duran Surgical Pathology Report     Clinical History: Lymphadenopathy (jlr)     FINAL MICROSCOPIC DIAGNOSIS:  A. LYMPH NODE, RIGHT CERVICAL, BIOPSY: - Nodular lymphocyte predominant Hodgkin lymphoma - See comment  COMMENT:  The sections show lymph nodal tissue displaying effacement of the architecture by a vaguely nodular lymphoproliferative process characterized by predominance of small round lymphoid cells and admixed with variable number of large atypical mononuclear and multi-lobated lymphoid appearing cells with variably prominent nucleoli.  Scattered histiocytes and large centroblastic lymphoid cells are also present. Flow cytometric analysis was performed ([id number]) and shows relative abundance of T-cells with increased proportion of double positive T cells.  No monoclonal B-cell population identified.  In addition, immunohistochemical stains for LCA, CD20, PAX5, CD79a, CD21, CD5, CD3, BCL6, CD10, MUM1, CD15, CD30, EMA and EBV were performed with appropriate controls.  LCA is diffusely positive.  CD21 highlights abundant follicular dendritic networks within the lymphoid nodules. The atypical large lymphoid cells in the nodular areas are positive for CD20, CD79a, PAX5, BCL6.  Occasional cells are weakly positive for MUM1. No significant staining is seen and for CD10, CD15, EMA or CD30.  The latter shows scattering of positive large centroblastic lymphoid cells. In addition, EBV staining shows scattering of positive cells in the background, but the large atypical lymphoid cells appear negative.  The small  lymphoid component shows a mixture of T and B cells although there is generally relative abundance of T cells. The morphologic and immunophenotypic features are consistent with involvement by nodular lymphocyte predominant Hodgkin lymphoma.    RADIOGRAPHIC STUDIES: I have personally reviewed the radiological images as listed and agreed with the findings in the report. No results found.  ASSESSMENT & PLAN Geoffrey Duran is a 78 y.o. male who presents to the clinic for newly diagnosed nodular lymphocyte predominant Hodgkin's lymphoma.   #Nodular Lymphocyte Predominant Hodgkin's Lymphoma --Underwent right posterior cervical lymph node excision on 02/19/2024 that confirmed diagnosis --Need labs today to check CBC, CMP, LDH, hepatitis panel, sedimentation rate --Need staging PET/CT scan scheduled for tomorrow 03/12/2024 --Need baseline ECHO scheduled for this Friday, 03/15/2024 --RTC in one week to review remaining workup and review treatment recommendations.   Orders Placed This Encounter  Procedures   CBC with Differential (Cancer Center Only)    Standing Status:   Future    Expiration Date:   03/11/2025   CMP (Cancer Center only)    Standing Status:   Future    Expiration Date:   03/11/2025   Lactate dehydrogenase (LDH)    Standing Status:   Future    Expiration Date:   03/11/2025   Sedimentation rate    Standing Status:   Future    Expiration Date:   03/11/2025   Hepatitis B surface antibody    Standing Status:   Future    Expiration Date:   03/11/2025   Hepatitis B surface antigen    Standing Status:   Future    Expiration Date:   03/11/2025   Hepatitis C antibody    Standing Status:   Future    Expiration Date:   03/11/2025   Hepatitis B core antibody, total    Standing Status:   Future    Expiration Date:   03/11/2025   C-reactive protein    Standing Status:   Future    Expiration Date:   03/11/2025    All questions were answered. The patient knows to call the clinic with any  problems, questions or concerns.  I have spent a total of 60 minutes minutes of face-to-face and non-face-to-face time, preparing to see the patient, obtaining and/or reviewing separately obtained history, performing a medically appropriate examination, counseling and educating the patient, ordering medications/tests/procedures, referring and communicating with other health care professionals, documenting clinical information in the electronic health record, independently interpreting results and communicating results to the patient, and care coordination.   Geoffrey Hearing, PA-C Department of Hematology/Oncology [facility] Phone: [phone]   Patient was seen with Dr. Rosaline Coma  I have read the above note and personally examined the patient. I agree with the assessment and plan as noted above.  Briefly Geoffrey Duran is a 78 year old male who presents for evaluation of newly diagnosed nodular lymphocyte predominant Hodgkin's lymphoma.  The patient has lymphadenopathy of the neck and recently had a biopsy performed which showed this diagnosis.  Today we discussed the diagnosis of Hodgkin lymphoma status moving forward.  We discussed that staging will need to be performed with a PET CT scan and pending results of the PET CT scan we will determine treatment course.  If he is low stage disease radiation therapy could be considered.  If he has higher stage disease could opt for observation versus chemotherapy.  The patient voiced understanding of our findings and plan moving forward.  Geoffrey Clay, MD Department of Hematology/Oncology [facility] at [facility] Phone: [phone] Pager: [phone] Email: Geoffrey Duran

## 2024-03-12 ENCOUNTER — Encounter (HOSPITAL_COMMUNITY)
Admission: RE | Admit: 2024-03-12 | Discharge: 2024-03-12 | Disposition: A | Discharge status: Home / Self Care | Source: Ambulatory Visit | Attending: Physician Assistant

## 2024-03-12 ENCOUNTER — Telehealth: Payer: Self-pay | Admitting: Hematology and Oncology

## 2024-03-12 DIAGNOSIS — C8101 Nodular lymphocyte predominant Hodgkin lymphoma, lymph nodes of head, face, and neck: Secondary | ICD-10-CM | POA: Diagnosis not present

## 2024-03-12 DIAGNOSIS — C8191 Hodgkin lymphoma, unspecified, lymph nodes of head, face, and neck: Secondary | ICD-10-CM | POA: Diagnosis not present

## 2024-03-12 DIAGNOSIS — R911 Solitary pulmonary nodule: Secondary | ICD-10-CM | POA: Diagnosis not present

## 2024-03-12 LAB — MISC LABCORP TEST (SEND OUT)
Labcorp test code: 6395
Labcorp test code: 6510

## 2024-03-12 LAB — HEPATITIS B CORE ANTIBODY, TOTAL: HEP B CORE AB: NEGATIVE

## 2024-03-12 LAB — GLUCOSE, CAPILLARY: Glucose-Capillary: 91 mg/dL (ref 70–99)

## 2024-03-12 MED ORDER — FLUDEOXYGLUCOSE F - 18 (FDG) INJECTION
9.8000 | Freq: Once | INTRAVENOUS | Status: AC | PRN
  Administered 2024-03-12: 9.8 via INTRAVENOUS

## 2024-03-15 ENCOUNTER — Ambulatory Visit (HOSPITAL_COMMUNITY)
Admission: RE | Admit: 2024-03-15 | Discharge: 2024-03-15 | Disposition: A | Discharge status: Home / Self Care | Source: Ambulatory Visit | Attending: Physician Assistant | Admitting: Physician Assistant

## 2024-03-15 DIAGNOSIS — R001 Bradycardia, unspecified: Secondary | ICD-10-CM | POA: Diagnosis not present

## 2024-03-15 DIAGNOSIS — R600 Localized edema: Secondary | ICD-10-CM | POA: Diagnosis not present

## 2024-03-15 DIAGNOSIS — I10 Essential (primary) hypertension: Secondary | ICD-10-CM | POA: Insufficient documentation

## 2024-03-15 DIAGNOSIS — C8101 Nodular lymphocyte predominant Hodgkin lymphoma, lymph nodes of head, face, and neck: Secondary | ICD-10-CM | POA: Diagnosis not present

## 2024-03-15 DIAGNOSIS — I351 Nonrheumatic aortic (valve) insufficiency: Secondary | ICD-10-CM | POA: Insufficient documentation

## 2024-03-15 LAB — ECHOCARDIOGRAM COMPLETE
AR max vel: 3.26 cm2
AV Area VTI: 3.72 cm2
AV Area mean vel: 3.01 cm2
AV Mean grad: 4 mmHg
AV Peak grad: 6.5 mmHg
Ao pk vel: 1.27 m/s
Area-P 1/2: 2.45 cm2
Calc EF: 65.5 %
MV VTI: 4.95 cm2
P 1/2 time: 779 ms
S' Lateral: 2.5 cm
Single Plane A2C EF: 73.3 %
Single Plane A4C EF: 61.8 %

## 2024-03-15 NOTE — Progress Notes (Signed)
  Echocardiogram 2D Echocardiogram has been performed.  Darl Kuss L Marlene Pfluger RDCS 03/15/2024, 11:48 AM

## 2024-03-22 ENCOUNTER — Inpatient Hospital Stay

## 2024-03-22 ENCOUNTER — Inpatient Hospital Stay (HOSPITAL_BASED_OUTPATIENT_CLINIC_OR_DEPARTMENT_OTHER): Admitting: Hematology and Oncology

## 2024-03-22 ENCOUNTER — Other Ambulatory Visit: Payer: Self-pay | Admitting: Hematology and Oncology

## 2024-03-22 VITALS — BP 136/82 | HR 57 | Temp 97.7°F | Resp 13 | Wt 196.7 lb

## 2024-03-22 DIAGNOSIS — I10 Essential (primary) hypertension: Secondary | ICD-10-CM | POA: Diagnosis not present

## 2024-03-22 DIAGNOSIS — C8101 Nodular lymphocyte predominant Hodgkin lymphoma, lymph nodes of head, face, and neck: Secondary | ICD-10-CM

## 2024-03-22 DIAGNOSIS — Z87891 Personal history of nicotine dependence: Secondary | ICD-10-CM | POA: Diagnosis not present

## 2024-03-22 DIAGNOSIS — Z800 Family history of malignant neoplasm of digestive organs: Secondary | ICD-10-CM | POA: Diagnosis not present

## 2024-03-22 DIAGNOSIS — K219 Gastro-esophageal reflux disease without esophagitis: Secondary | ICD-10-CM | POA: Diagnosis not present

## 2024-03-22 DIAGNOSIS — Z85828 Personal history of other malignant neoplasm of skin: Secondary | ICD-10-CM | POA: Diagnosis not present

## 2024-03-22 LAB — CMP (CANCER CENTER ONLY)
ALT: 17 U/L (ref 0–44)
AST: 21 U/L (ref 15–41)
Albumin: 4.4 g/dL (ref 3.5–5.0)
Alkaline Phosphatase: 76 U/L (ref 38–126)
Anion gap: 5 (ref 5–15)
BUN: 18 mg/dL (ref 8–23)
CO2: 31 mmol/L (ref 22–32)
Calcium: 9.3 mg/dL (ref 8.9–10.3)
Chloride: 104 mmol/L (ref 98–111)
Creatinine: 1.33 mg/dL — ABNORMAL HIGH (ref 0.61–1.24)
GFR, Estimated: 55 mL/min — ABNORMAL LOW (ref >60)
Glucose, Bld: 76 mg/dL (ref 70–99)
Potassium: 4.3 mmol/L (ref 3.5–5.1)
Sodium: 140 mmol/L (ref 135–145)
Total Bilirubin: 0.7 mg/dL (ref 0.0–1.2)
Total Protein: 7.4 g/dL (ref 6.5–8.1)

## 2024-03-22 LAB — CBC WITH DIFFERENTIAL (CANCER CENTER ONLY)
Abs Immature Granulocytes: 0.02 10*3/uL (ref 0.00–0.07)
Basophils Absolute: 0 10*3/uL (ref 0.0–0.1)
Basophils Relative: 0 %
Eosinophils Absolute: 0.1 10*3/uL (ref 0.0–0.5)
Eosinophils Relative: 1 %
HCT: 37.4 % — ABNORMAL LOW (ref 39.0–52.0)
Hemoglobin: 12.7 g/dL — ABNORMAL LOW (ref 13.0–17.0)
Immature Granulocytes: 0 %
Lymphocytes Relative: 33 %
Lymphs Abs: 2.5 10*3/uL (ref 0.7–4.0)
MCH: 28.9 pg (ref 26.0–34.0)
MCHC: 34 g/dL (ref 30.0–36.0)
MCV: 85.2 fL (ref 80.0–100.0)
Monocytes Absolute: 2.4 10*3/uL — ABNORMAL HIGH (ref 0.1–1.0)
Monocytes Relative: 32 %
Neutro Abs: 2.6 10*3/uL (ref 1.7–7.7)
Neutrophils Relative %: 34 %
Platelet Count: 186 10*3/uL (ref 150–400)
RBC: 4.39 MIL/uL (ref 4.22–5.81)
RDW: 13.2 % (ref 11.5–15.5)
WBC Count: 7.5 10*3/uL (ref 4.0–10.5)
nRBC: 0 % (ref 0.0–0.2)

## 2024-03-22 LAB — LACTATE DEHYDROGENASE: LDH: 176 U/L (ref 98–192)

## 2024-03-22 NOTE — Progress Notes (Signed)
 FU visit with Dr Rosaline Coma who advised patient that treatment would consist of radiation instead of chemotherapy. He is referring patient to Dr Arthea Bilis for consultation. Will continue to follow patient for needs.

## 2024-03-22 NOTE — Progress Notes (Signed)
 [facility] Telephone:([phone]) [phone]   [phone]) [phone]  PROGRESS NOTE  Patient Care Team: Benedetta Bradley, MD as PCP - General (Internal Medicine)  Hematological/Oncological History # Nodular Lymphocyte Predominant Hodgkin's Lymphoma  Presented to PCP, Dr. Charlene Conners with right neck and shoulder pain. Physical exam findings was notable for nodular mass in the right posterior cervical region.  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 Molecular genetic testing with AFIRMA, and returned with a result of benign  02/19/2024: Underwent excision of right posterior cervical lymph node. Pathology was consistent with nodular lymphocyte predoominant Hodgkin's lymphoma 03/11/2024: Establish care with [facility] Hematology/Oncology with Dr Amparo Balk.   Interval History:  Geoffrey Duran 78 y.o. male with medical history significant for Nodular lymphomocyte predominate Hodgkin's lymphoma who presents for a follow up visit. The patient's last visit was on 03/11/2024. In the interim since the last visit he completed a PET CT scan which showed limited stage disease.   On exam today Geoffrey Duran he has been well overall in the interim since her last visit.  He reports he had no fevers, chills, sweats or weight loss.  Reports that he does have some occasional red bumps that are itchy on his neck and skin.  He reports his appetite remains strong.  Overall he feels quite well with no questions concerns or complaints today.  A full 10 point ROS is otherwise negative.  The bulk of our discussion focused on the results of his PET CT scan and steps moving forward.  Details  of this conversation are noted below.  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 Renna Cary- follows- [facility] as needed for palpitations   Elevated liver function tests    GERD (gastroesophageal reflux disease)    HOH (hard of hearing)    wears HAs   Hypertension    Pneumonia    Premature atrial contraction    Thrombocytopenia (HCC)     SURGICAL HISTORY: Past Surgical History:  Procedure Laterality Date   bilateral rotator surgery     COLONOSCOPY WITH PROPOFOL  N/A 03/08/2016   Procedure: COLONOSCOPY WITH PROPOFOL ;  Surgeon: Garrett Kallman, MD;  Location: [facility] ENDOSCOPY;  Service: Endoscopy;  Laterality: N/A;   HERNIA REPAIR  1970   right inguinal hernia   LYMPH NODE BIOPSY Right 02/19/2024   Procedure: LYMPH NODE BIOPSY;  Surgeon: Oralee Billow, MD;  Location: [facility] ORS;  Service: General;  Laterality: Right;  Excisional biopsy right posterior cervical lymph nodes    SOCIAL HISTORY: Social History   Socioeconomic History   Marital status: Married    Spouse name: Not on file   Number of children: Not on file   Years of education: Not on file   Highest education level: Not on file  Occupational History   Not on file  Tobacco Use   Smoking status: Former   Smokeless tobacco: Former    Types: Chew   Tobacco comments:    Smoked 4-5 years, quit 55 years ago.   Vaping Use   Vaping  status: Never Used  Substance and Sexual Activity   Alcohol use: No    Alcohol/week: 0.0 standard drinks of alcohol   Drug use: No   Sexual activity: Yes  Other Topics Concern   Not on file  Social History Narrative   Pt lives in [place] with spouse. 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   Social Drivers of Corporate investment banker Strain: Not on file  Food Insecurity: No Food Insecurity (03/11/2024)   Hunger Vital Sign    Worried About Running Out of Food in the Last  Year: Never true    Ran Out of Food in the Last Year: Never true  Transportation Needs: No Transportation Needs (03/11/2024)   PRAPARE - Administrator, Civil Service (Medical): No    Lack of Transportation (Non-Medical): No  Physical Activity: Not on file  Stress: Not on file  Social Connections: Not on file  Intimate Partner Violence: Not At Risk (03/11/2024)   Humiliation, Afraid, Rape, and Kick questionnaire    Fear of Current or Ex-Partner: No    Emotionally Abused: No    Physically Abused: No    Sexually Abused: No    FAMILY HISTORY: Family History  Problem Relation Age of Onset   Sudden death Mother        Ventricular Fibrillation at age 63   Colon cancer Brother    Cancer Brother    Heart attack Neg Hx    Stroke Neg Hx     ALLERGIES:  is allergic to heparin .  MEDICATIONS:  Current Outpatient Medications  Medication Sig Dispense Refill   famotidine (PEPCID) 20 MG tablet Take 20 mg by mouth 2 (two) times daily.     pantoprazole (PROTONIX) 40 MG tablet Take 40 mg by mouth daily as needed (acid reflux).     No current facility-administered medications for this visit.    REVIEW OF SYSTEMS:   Constitutional: ( - ) fevers, ( - )  chills , ( - ) night sweats Eyes: ( - ) blurriness of vision, ( - ) double vision, ( - ) watery eyes Ears, nose, mouth, throat, and face: ( - ) mucositis, ( - ) sore throat Respiratory: ( - ) cough, ( - ) dyspnea, ( - ) wheezes Cardiovascular: ( - ) palpitation, ( - ) chest discomfort, ( - ) lower extremity swelling Gastrointestinal:  ( - ) nausea, ( - ) heartburn, ( - ) change in bowel habits Skin: ( - ) abnormal skin rashes Lymphatics: ( - ) new lymphadenopathy, ( - ) easy bruising Neurological: ( - ) numbness, ( - ) tingling, ( - ) new weaknesses Behavioral/Psych: ( - ) mood change, ( - ) new changes  All other systems were reviewed with the patient and are negative.  PHYSICAL EXAMINATION:  Vitals:   03/22/24 0941  BP:  136/82  Pulse: (!) 57  Resp: 13  Temp: 97.7 F (36.5 C)  SpO2: 99%   Filed Weights   03/22/24 0941  Weight: 196 lb 11.2 oz (89.2 kg)    GENERAL: Well-appearing elderly Caucasian male, alert, no distress and comfortable SKIN: skin color, texture, turgor are normal, no rashes or significant lesions EYES: conjunctiva are pink and non-injected, sclera clear OROPHARYNX: no exudate, no erythema; lips, buccal mucosa, and tongue normal  NECK: supple, non-tender LYMPH: Palpable right sided cervical lymphadenopathy, otherwise no palpable lymphadenopathy in the cervical, axillary or inguinal LUNGS: clear to auscultation and percussion with normal breathing effort HEART:  regular rate & rhythm and no murmurs and no lower extremity edema Musculoskeletal: no cyanosis of digits and no clubbing  PSYCH: alert & oriented x 3, fluent speech NEURO: no focal motor/sensory deficits  LABORATORY DATA:  I have reviewed the data as listed    Latest Ref Rng & Units 03/22/2024    8:53 AM 03/11/2024    9:39 AM 02/16/2024    8:21 AM  CBC  WBC 4.0 - 10.5 K/uL 7.5  7.7  8.1   Hemoglobin 13.0 - 17.0 g/dL 69.6  29.5  28.4   Hematocrit 39.0 - 52.0 % 37.4  39.6  42.3   Platelets 150 - 400 K/uL 186  167  191        Latest Ref Rng & Units 03/22/2024    8:53 AM 03/11/2024    9:39 AM 02/16/2024    8:21 AM  CMP  Glucose 70 - 99 mg/dL 76  90  132   BUN 8 - 23 mg/dL 18  20  23    Creatinine 0.61 - 1.24 mg/dL 4.40  1.02  7.25   Sodium 135 - 145 mmol/L 140  139  136   Potassium 3.5 - 5.1 mmol/L 4.3  3.8  3.8   Chloride 98 - 111 mmol/L 104  103  100   CO2 22 - 32 mmol/L 31  29  25    Calcium 8.9 - 10.3 mg/dL 9.3  9.1  9.2   Total Protein 6.5 - 8.1 g/dL 7.4  7.7  7.8   Total Bilirubin 0.0 - 1.2 mg/dL 0.7  0.7  0.7   Alkaline Phos 38 - 126 U/L 76  77  73   AST 15 - 41 U/L 21  17  23    ALT 0 - 44 U/L 17  14  22      RADIOGRAPHIC STUDIES: I have personally reviewed the radiological images as listed and agreed with  the findings in the report. ECHOCARDIOGRAM COMPLETE Result Date: 03/15/2024    ECHOCARDIOGRAM REPORT   Patient Name:   Geoffrey Duran Date of Exam: 03/15/2024 Medical Rec #:  [id number]   Height:       72.0 in Accession #:    [id number]  Weight:       197.3 lb Date of Birth:  Apr 10, 1946   BSA:          2.118 m Patient Age:    77 years    BP:           136/85 mmHg Patient Gender: M           HR:           55 bpm. Exam Location:  Outpatient Procedure: 2D Echo, 3D Echo, Cardiac Doppler, Color Doppler and Strain Analysis            (Both Spectral and Color Flow Doppler were utilized during            procedure). Indications:    Chemo  History:        Patient has prior history of Echocardiogram examinations, most                 recent 01/23/2013. Arrythmias:Bradycardia; Risk                 Factors:Hypertension.  Sonographer:    Aldon Ambrosia Referring Phys: [id number] IRENE T THAYIL IMPRESSIONS  1. Left ventricular ejection fraction, by estimation, is 65 to 70%. The left ventricle has normal function. The left ventricle has no  regional wall motion abnormalities. Left ventricular diastolic parameters were normal. The average left ventricular global longitudinal strain is -20.4 %. The global longitudinal strain is normal.  2. Right ventricular systolic function is normal. The right ventricular size is normal. There is normal pulmonary artery systolic pressure.  3. The mitral valve is normal in structure. No evidence of mitral valve regurgitation. No evidence of mitral stenosis.  4. The aortic valve is tricuspid. Aortic valve regurgitation is mild. No aortic stenosis is present. Comparison(s): No prior Echocardiogram. FINDINGS  Left Ventricle: Left ventricular ejection fraction, by estimation, is 65 to 70%. The left ventricle has normal function. The left ventricle has no regional wall motion abnormalities. The average left ventricular global longitudinal strain is -20.4 %. Strain was performed and the global longitudinal strain  is normal. The left ventricular internal cavity size was normal in size. There is no left ventricular hypertrophy. Left ventricular diastolic parameters were normal. Right Ventricle: The right ventricular size is normal. No increase in right ventricular wall thickness. Right ventricular systolic function is normal. There is normal pulmonary artery systolic pressure. The tricuspid regurgitant velocity is 2.28 m/s, and  with an assumed right atrial pressure of 3 mmHg, the estimated right ventricular systolic pressure is 23.8 mmHg. Left Atrium: Left atrial size was normal in size. Right Atrium: Right atrial size was normal in size. Pericardium: There is no evidence of pericardial effusion. Mitral Valve: The mitral valve is normal in structure. No evidence of mitral valve regurgitation. No evidence of mitral valve stenosis. MV peak gradient, 2.1 mmHg. The mean mitral valve gradient is 1.0 mmHg. Tricuspid Valve: The tricuspid valve is normal in structure. Tricuspid valve regurgitation is trivial. No evidence of tricuspid stenosis. Aortic Valve: The aortic valve is tricuspid. Aortic valve regurgitation is mild. Aortic regurgitation PHT measures 779 msec. No aortic stenosis is present. Aortic valve mean gradient measures 4.0 mmHg. Aortic valve peak gradient measures 6.5 mmHg. Aortic  valve area, by VTI measures 3.72 cm. Pulmonic Valve: The pulmonic valve was not well visualized. Pulmonic valve regurgitation is not visualized. Aorta: The aortic root and ascending aorta are structurally normal, with no evidence of dilitation. IAS/Shunts: The atrial septum is grossly normal. Additional Comments: 3D was performed not requiring image post processing on an independent workstation and was normal.  LEFT VENTRICLE PLAX 2D LVIDd:         3.80 cm      Diastology LVIDs:         2.50 cm      LV e' medial:    7.62 cm/s LV PW:         0.90 cm      LV E/e' medial:  8.6 LV IVS:        1.10 cm      LV e' lateral:   9.03 cm/s LVOT diam:      2.20 cm      LV E/e' lateral: 7.3 LV SV:         106 LV SV Index:   50           2D Longitudinal Strain LVOT Area:     3.80 cm     2D Strain GLS Avg:     -20.4 %  LV Volumes (MOD) LV vol d, MOD A2C: 100.0 ml 3D Volume EF: LV vol d, MOD A4C: 88.8 ml  3D EF:        62 % LV vol s, MOD A2C: 26.7 ml LV vol s, MOD A4C:  33.9 ml LV SV MOD A2C:     73.3 ml LV SV MOD A4C:     88.8 ml LV SV MOD BP:      63.3 ml RIGHT VENTRICLE             IVC RV Basal diam:  2.80 cm     IVC diam: 1.30 cm RV Mid diam:    1.80 cm RV S prime:     10.90 cm/s TAPSE (M-mode): 1.9 cm LEFT ATRIUM             Index        RIGHT ATRIUM           Index LA diam:        3.20 cm 1.51 cm/m   RA Area:     10.90 cm LA Vol (A2C):   35.5 ml 16.76 ml/m  RA Volume:   20.00 ml  9.44 ml/m LA Vol (A4C):   34.7 ml 16.38 ml/m LA Biplane Vol: 35.6 ml 16.81 ml/m  AORTIC VALVE                    PULMONIC VALVE AV Area (Vmax):    3.26 cm     PV Vmax:          0.69 m/s AV Area (Vmean):   3.01 cm     PV Peak grad:     1.9 mmHg AV Area (VTI):     3.72 cm     PR End Diast Vel: 2.72 msec AV Vmax:           127.00 cm/s AV Vmean:          90.000 cm/s AV VTI:            0.285 m AV Peak Grad:      6.5 mmHg AV Mean Grad:      4.0 mmHg LVOT Vmax:         109.00 cm/s LVOT Vmean:        71.300 cm/s LVOT VTI:          0.279 m LVOT/AV VTI ratio: 0.98 AI PHT:            779 msec  AORTA Ao Root diam: 3.30 cm Ao Asc diam:  3.40 cm MITRAL VALVE               TRICUSPID VALVE MV Area (PHT): 2.45 cm    TR Peak grad:   20.8 mmHg MV Area VTI:   4.95 cm    TR Vmax:        228.00 cm/s MV Peak grad:  2.1 mmHg MV Mean grad:  1.0 mmHg    SHUNTS MV Vmax:       0.72 m/s    Systemic VTI:  0.28 m MV Vmean:      52.7 cm/s   Systemic Diam: 2.20 cm MV Decel Time: 310 msec MV E velocity: 65.60 cm/s MV A velocity: 83.10 cm/s MV E/A ratio:  0.79 Gloriann Larger MD Electronically signed by Gloriann Larger MD Signature Date/Time: 03/15/2024/12:34:29 PM    Final    NM PET Image Initial (PI)  Skull Base To Thigh Result Date: 03/12/2024 CLINICAL DATA:  Initial treatment strategy for nodular lymphocytic Hodgkin's lymphoma. EXAM: NUCLEAR MEDICINE PET SKULL BASE TO THIGH TECHNIQUE: 9.8 mCi F-18 FDG was injected intravenously. Full-ring PET imaging was performed from the skull base to thigh after the radiotracer. CT data was obtained and used for attenuation correction  and anatomic localization. Fasting blood glucose: 91 mg/dl COMPARISON:  Thyroid  ultrasound 12/29/2023. FINDINGS: Mediastinal blood pool activity: SUV max 2.5 Liver activity: SUV max 3.1 NECK: There are several hypermetabolic nodes identified in the right side of the neck. These involve nodes in jugular chain zone 2, 3, 4. Example includes focus maximum SUV value of 13.2 corresponding to the node on image 26 measuring 11 x 13 mm. Node slightly more anterior and caudal maximum SUV value of 8.7, measures on image 35 at 12 by 17 mm. Thirty example seen even more caudal immediately posterior to the sternocleidomastoid muscle with maximum SUV of 8.0 and dimension on image 42 measuring 9 by 14 mm. There is some patchy uptake involving the enlarged right thyroid  gland with maximum SUV of 12.4. Please correlate with previous thyroid  biopsy from 11/30/2023. No abnormal uptake along the left side of the neck lymph nodes. Near symmetric uptake of the visualized intracranial compartment. Incidental CT findings: Paranasal sinuses and mastoid air cells are clear. Streak artifact related to the patient's dental hardware. CHEST: Abnormal uptake within an upper mediastinal lymph node on the right side, right paratracheal. Maximum SUV of 8.7. Node measures 18 x 10 mm on series 4, image 54. Additional abnormal node more anteriorly in the upper mediastinum with uptake approaching 5.4 SUV maximum. Focus on image 56 is immediately caudal to the isthmus of the thyroid  gland anterior to the trachea. Node just to the left of this on image 56 is also noted. No  additional areas of abnormal uptake above blood pool in the axillary region, hilum or elsewhere along the mediastinum. No abnormal lung uptake. Incidental CT findings: Breathing motion. There is some linear opacity lung bases likely scar or atelectasis. There is a 3 mm right upper lobe apical nodule medially on image 58. No abnormal uptake but quite small and may be below threshold. Coronary artery calcifications are seen. Heart is nonenlarged. No pericardial effusion. Slightly patulous esophagus. ABDOMEN/PELVIS: There is physiologic uptake seen along the parenchymal organs, bowel and renal collecting systems. Specifically no abnormal splenic uptake. Longitudinal length the spleen measures up to 10.7 cm. Incidental CT findings: Scattered vascular calcifications are identified along the aorta and branch vessels. Enlarged prostate with mass effect along the base of the bladder. No abnormal uptake. Bladder wall slightly thickened. Please correlate with any symptoms and BPH. Please correlate with the patient's PSA. Scattered colonic stool. Normal appendix. Stomach and small bowel are nondilated. Small fat containing umbilical hernias. Multiple benign-appearing hepatic cystic foci. Gallbladder is nondilated. SKELETON: No abnormal uptake along the visualized osseous structures. Physiologic muscle uptake identified along the right shoulder and chest. Incidental CT findings: Scattered degenerative changes noted. Trace retrolisthesis at L2-3 and L1-2. IMPRESSION: Multiple hypermetabolic right-sided neck lymph nodes along the jugular chain from zone 2 through zone 4. Additional abnormal nodes in the extreme upper mediastinum occluding paratracheal. Heterogeneous abnormal uptake along the right thyroid  lobe. Please correlate with previous thyroid  biopsy. No additional areas of abnormal uptake including no additional nodes in the chest, abdomen pelvis. Spleen is normal in size without abnormal uptake. Tiny right apical lung  nodule without abnormal uptake. Please correlate for any prior to assess stability or recommend follow up in 1 year or sooner as per the patient's history of lymphoma. Electronically Signed   By: Adrianna Horde M.D.   On: 03/12/2024 17:02    ASSESSMENT & PLAN Geoffrey Duran 78 y.o. male with medical history significant for Nodular lymphomocyte predominate Hodgkin's lymphoma who presents for  a follow up visit.   #Nodular Lymphocyte Predominant Hodgkin's Lymphoma, Stage II continuous  --Underwent right posterior cervical lymph node excision on 02/19/2024 that confirmed diagnosis --PET CT scan shows limited stage disease, most consistent with a contiguous stage II disease. --Labs today show white blood cell 7.5, hemoglobin 12.7, MCV 85.2, platelets 186 --At this time would recommend consideration of definitive curative radiation therapy. --In the event that radiation therapy was not feasible would consider chemotherapy with rituximab based regimen. --Once patient is completed radiation therapy we can resume monitoring to assure no recurrence. --RTC placeholder in 6 weeks but will plan to schedule for approximately 1 week after completion of radiation therapy.  Orders Placed This Encounter  Procedures   Ambulatory referral to Radiation Oncology    Referral Priority:   Routine    Referral Type:   Consultation    Referral Reason:   Specialty Services Required    Requested Specialty:   Radiation Oncology    Number of Visits Requested:   1    All questions were answered. The patient knows to call the clinic with any problems, questions or concerns.  A total of more than 30 minutes were spent on this encounter with face-to-face time and non-face-to-face time, including preparing to see the patient, ordering tests and/or medications, counseling the patient and coordination of care as outlined above.   Rogerio Clay, MD Department of Hematology/Oncology [facility] at [facility] Phone: [phone] Pager: [phone] Email: [email or web address]  03/22/2024 5:48 PM

## 2024-03-25 NOTE — Progress Notes (Signed)
  Lymphoma Location(s) / Histology:  Nodular Lymphocyte Predominant Hodgkin's Lymphoma  Geoffrey Duran presented with symptoms of:   Right Neck and shoulder pain that lasted about one year.  Biopsies revealed:    Past/Anticipated interventions by medical oncology, if any:  03/22/2024 Rosaline Coma, MD   Weight changes, if any, over the past 6 months:  Patient states weight has stayed stable and has been eating and drinking well.  Recurrent fevers, or drenching night sweats, if any:  Patient denies any fevers, chills, sweats or weight loss.  SAFETY ISSUES: Prior radiation? None Pacemaker/ICD? None Possible current pregnancy? N/A Is the patient on methotrexate? None  Current Complaints / other details:   None

## 2024-03-26 ENCOUNTER — Ambulatory Visit
Admission: RE | Admit: 2024-03-26 | Discharge: 2024-03-26 | Disposition: A | Discharge status: Home / Self Care | Source: Ambulatory Visit | Attending: Radiation Oncology | Admitting: Radiation Oncology

## 2024-03-26 ENCOUNTER — Other Ambulatory Visit: Payer: Self-pay

## 2024-03-26 ENCOUNTER — Encounter: Payer: Self-pay | Admitting: Radiation Oncology

## 2024-03-26 VITALS — BP 142/74 | HR 56 | Temp 97.7°F | Resp 18 | Ht 72.0 in | Wt 197.2 lb

## 2024-03-26 DIAGNOSIS — C8101 Nodular lymphocyte predominant Hodgkin lymphoma, lymph nodes of head, face, and neck: Secondary | ICD-10-CM | POA: Diagnosis not present

## 2024-03-26 DIAGNOSIS — C8110 Nodular sclerosis classical Hodgkin lymphoma, unspecified site: Secondary | ICD-10-CM

## 2024-03-26 DIAGNOSIS — C8100 Nodular lymphocyte predominant Hodgkin lymphoma, unspecified site: Secondary | ICD-10-CM | POA: Insufficient documentation

## 2024-03-26 NOTE — Progress Notes (Signed)
 Oncology Nurse Navigator Documentation   Met with patient during initial consult with Dr. Lurena Sally. He was accompanied by his wife.  I introduced myself as his/their Navigator, explained my role as a member of the Care Team.. Assisted with post-consult appt scheduling. I toured him to the [facility] treatment area, explained procedures for lobby registration, arrival to Radiation Waiting, and arrival to treatment area.    They verbalized understanding of information provided. I encouraged them to call with questions/concerns moving forward.  Lynetta Saran, RN, BSN, OCN Head & Neck Oncology Nurse Navigator [facility] [place] [phone]

## 2024-03-26 NOTE — Progress Notes (Signed)
 Radiation Oncology         [phone] ________________________________  Initial outpatient Consultation  Name: Geoffrey Duran MRN: [id number]  Date: 03/26/2024  DOB: 1946-04-27  HQ:[id number], Winslow Hawk, MD  Ander Bame, MD   REFERRING PHYSICIAN: Ander Bame, MD  DIAGNOSIS:    ICD-10-CM   1. 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 male who presents for a radiation oncology consultation. He was referred by Dr. Amparo Balk for consideration of definitive radiation therapy for nodular lymphocyte predominant Hodgkin's lymphoma .  He experiences right neck and shoulder pain, described as 'a thousand fire ants' crawling from the front of his right chest through the shoulder and out the back. This pain worsens with turning his neck to the right and improves with turning to the left. Symptoms have improved significantly since the excision of the right posterior cervical node, allowing more comfortable neck movement.  He underwent excision of a right posterior cervical node on February 19, 2024, leading to the diagnosis of nodular lymphocyte predominant Hodgkin's lymphoma. Prior to the excision, he experienced night sweats with drenched sheets, which have significantly decreased post-surgery. He denies unexplained weight loss or fevers.  PET scan notable for evidence of SUV uptake in right neck and upper right mediastinum, personally reviewed by me.  He has not received any prior radiation therapy. 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  He recently had a benign biopsy of a right thyroid  lesion.   PREVIOUS RADIATION THERAPY: No  PAST MEDICAL HISTORY:  has a past medical history of Arthritis, Cancer (HCC), Dysrhythmia, Elevated liver function tests, GERD (gastroesophageal reflux disease), HOH (hard of hearing), Hypertension, Pneumonia, Premature atrial contraction, and Thrombocytopenia (HCC).    PAST SURGICAL HISTORY: Past Surgical History:  Procedure Laterality Date   bilateral rotator surgery     COLONOSCOPY WITH PROPOFOL  N/A 03/08/2016   Procedure: COLONOSCOPY WITH PROPOFOL ;  Surgeon: Garrett Kallman, MD;  Location: [facility];  Service: Endoscopy;  Laterality: N/A;   HERNIA REPAIR  1970   right inguinal hernia   LYMPH NODE BIOPSY Right 02/19/2024   Procedure: LYMPH NODE BIOPSY;  Surgeon: Oralee Billow, MD;  Location: [facility];  Service: General;  Laterality: Right;  Excisional biopsy right posterior cervical lymph nodes    FAMILY HISTORY: family history includes Cancer in his brother; Colon cancer in his brother; Sudden death in his mother.  SOCIAL HISTORY:  reports that he has quit smoking. He has quit using smokeless tobacco.  His smokeless tobacco use included chew. He reports that he does not drink alcohol and does not use drugs.  ALLERGIES: Heparin   MEDICATIONS:  Current Outpatient Medications  Medication Sig Dispense Refill   famotidine (PEPCID) 20 MG tablet Take 20 mg by mouth 2 (two) times daily.     pantoprazole (PROTONIX) 40 MG tablet Take 40 mg by mouth daily as needed (acid reflux).     No current facility-administered medications for this encounter.    REVIEW  OF SYSTEMS:  Notable for that above.   PHYSICAL EXAM:  height is 6' (1.829 m) and weight is 197 lb 3.2 oz (89.4 kg). His temperature is 97.7 F (36.5 C). His blood pressure is 142/74 (abnormal) and his pulse is 56 (abnormal). His respiration is 18 and oxygen  saturation is 100%.   General: Alert and oriented, in no acute distress   HEENT: Head is normocephalic. Extraocular movements are intact. Oropharynx is clear. Neck: Muscular neck, no palpable enlarged lymph nodes. Thickening around surgical scar in right neck level three region. CHEST: Lungs clear to auscultation bilaterally. Heart: Regular in rate and rhythm with no murmurs, rubs, or gallops. Chest: Clear to auscultation bilaterally, with no rhonchi, wheezes, or rales. Abdomen: Soft, nontender, nondistended, with no rigidity or guarding. Extremities: No cyanosis or edema. Lymphatics: see Neck Exam Skin: No concerning lesions. Musculoskeletal: symmetric strength and muscle tone throughout. Neurologic: Cranial nerves II through XII are grossly intact. No obvious focalities. Speech is fluent. Coordination is intact. Psychiatric: Judgment and insight are intact. Affect is appropriate.   ECOG = 0  0 - Asymptomatic (Fully active, able to carry on all predisease activities without restriction)  1 - Symptomatic but completely ambulatory (Restricted in physically strenuous activity but ambulatory and able to carry out work of a light or sedentary nature. For example, light housework, office work)  2 - Symptomatic, <50% in bed during the day (Ambulatory and capable of all self care but unable to carry out any work activities. Up and about more than 50% of waking hours)  3 - Symptomatic, >50% in bed, but not bedbound (Capable of only limited self-care, confined to bed or chair 50% or more of waking hours)  4 - Bedbound (Completely disabled. Cannot carry on any self-care. Totally confined to bed or chair)  5 - Death   Aurea Blossom MM, Creech RH, Tormey DC, et al. [phone]). "Toxicity and response criteria of the [facility]". Am. Hillard Lowes. Oncol. 5 (6): 649-55   LABORATORY DATA:  Lab Results  Component Value Date   WBC 7.5 03/22/2024   HGB 12.7 (L) 03/22/2024   HCT 37.4 (L)  03/22/2024   MCV 85.2 03/22/2024   PLT 186 03/22/2024   CMP     Component Value Date/Time   NA 140 03/22/2024 0853   K 4.3 03/22/2024 0853   CL 104 03/22/2024 0853   CO2 31 03/22/2024 0853   GLUCOSE 76 03/22/2024 0853   BUN 18 03/22/2024 0853   CREATININE 1.33 (H) 03/22/2024 0853   CALCIUM 9.3 03/22/2024 0853   PROT 7.4 03/22/2024 0853   ALBUMIN 4.4 03/22/2024 0853   AST 21 03/22/2024 0853   ALT 17 03/22/2024 0853   ALKPHOS 76 03/22/2024 0853   BILITOT 0.7 03/22/2024 0853   GFRNONAA 55 (L) 03/22/2024 0853         RADIOGRAPHY: ECHOCARDIOGRAM COMPLETE Result Date: 03/15/2024    ECHOCARDIOGRAM REPORT   Patient Name:   KAMARIE PALMA Date of Exam: 03/15/2024 Medical Rec #:  [id number]   Height:       72.0 in Accession #:    [id number]  Weight:       197.3 lb Date of Birth:  11-22-1945   BSA:          2.118 m Patient Age:    77 years    BP:           136/85 mmHg Patient Gender: M  HR:           55 bpm. Exam Location:  Outpatient Procedure: 2D Echo, 3D Echo, Cardiac Doppler, Color Doppler and Strain Analysis            (Both Spectral and Color Flow Doppler were utilized during            procedure). Indications:    Chemo  History:        Patient has prior history of Echocardiogram examinations, most                 recent 01/23/2013. Arrythmias:Bradycardia; Risk                 Factors:Hypertension.  Sonographer:    Aldon Ambrosia Referring Phys: [id number] IRENE T THAYIL IMPRESSIONS  1. Left ventricular ejection fraction, by estimation, is 65 to 70%. The left ventricle has normal function. The left ventricle has no regional wall motion abnormalities. Left ventricular diastolic parameters were normal. The average left ventricular global longitudinal strain is -20.4 %. The global longitudinal strain is normal.  2. Right ventricular systolic function is normal. The right ventricular size is normal. There is normal pulmonary artery systolic pressure.  3. The mitral valve is normal in structure. No  evidence of mitral valve regurgitation. No evidence of mitral stenosis.  4. The aortic valve is tricuspid. Aortic valve regurgitation is mild. No aortic stenosis is present. Comparison(s): No prior Echocardiogram. FINDINGS  Left Ventricle: Left ventricular ejection fraction, by estimation, is 65 to 70%. The left ventricle has normal function. The left ventricle has no regional wall motion abnormalities. The average left ventricular global longitudinal strain is -20.4 %. Strain was performed and the global longitudinal strain is normal. The left ventricular internal cavity size was normal in size. There is no left ventricular hypertrophy. Left ventricular diastolic parameters were normal. Right Ventricle: The right ventricular size is normal. No increase in right ventricular wall thickness. Right ventricular systolic function is normal. There is normal pulmonary artery systolic pressure. The tricuspid regurgitant velocity is 2.28 m/s, and  with an assumed right atrial pressure of 3 mmHg, the estimated right ventricular systolic pressure is 23.8 mmHg. Left Atrium: Left atrial size was normal in size. Right Atrium: Right atrial size was normal in size. Pericardium: There is no evidence of pericardial effusion. Mitral Valve: The mitral valve is normal in structure. No evidence of mitral valve regurgitation. No evidence of mitral valve stenosis. MV peak gradient, 2.1 mmHg. The mean mitral valve gradient is 1.0 mmHg. Tricuspid Valve: The tricuspid valve is normal in structure. Tricuspid valve regurgitation is trivial. No evidence of tricuspid stenosis. Aortic Valve: The aortic valve is tricuspid. Aortic valve regurgitation is mild. Aortic regurgitation PHT measures 779 msec. No aortic stenosis is present. Aortic valve mean gradient measures 4.0 mmHg. Aortic valve peak gradient measures 6.5 mmHg. Aortic  valve area, by VTI measures 3.72 cm. Pulmonic Valve: The pulmonic valve was not well visualized. Pulmonic valve  regurgitation is not visualized. Aorta: The aortic root and ascending aorta are structurally normal, with no evidence of dilitation. IAS/Shunts: The atrial septum is grossly normal. Additional Comments: 3D was performed not requiring image post processing on an independent workstation and was normal.  LEFT VENTRICLE PLAX 2D LVIDd:         3.80 cm      Diastology LVIDs:         2.50 cm      LV e' medial:    7.62 cm/s LV PW:  0.90 cm      LV E/e' medial:  8.6 LV IVS:        1.10 cm      LV e' lateral:   9.03 cm/s LVOT diam:     2.20 cm      LV E/e' lateral: 7.3 LV SV:         106 LV SV Index:   50           2D Longitudinal Strain LVOT Area:     3.80 cm     2D Strain GLS Avg:     -20.4 %  LV Volumes (MOD) LV vol d, MOD A2C: 100.0 ml 3D Volume EF: LV vol d, MOD A4C: 88.8 ml  3D EF:        62 % LV vol s, MOD A2C: 26.7 ml LV vol s, MOD A4C: 33.9 ml LV SV MOD A2C:     73.3 ml LV SV MOD A4C:     88.8 ml LV SV MOD BP:      63.3 ml RIGHT VENTRICLE             IVC RV Basal diam:  2.80 cm     IVC diam: 1.30 cm RV Mid diam:    1.80 cm RV S prime:     10.90 cm/s TAPSE (M-mode): 1.9 cm LEFT ATRIUM             Index        RIGHT ATRIUM           Index LA diam:        3.20 cm 1.51 cm/m   RA Area:     10.90 cm LA Vol (A2C):   35.5 ml 16.76 ml/m  RA Volume:   20.00 ml  9.44 ml/m LA Vol (A4C):   34.7 ml 16.38 ml/m LA Biplane Vol: 35.6 ml 16.81 ml/m  AORTIC VALVE                    PULMONIC VALVE AV Area (Vmax):    3.26 cm     PV Vmax:          0.69 m/s AV Area (Vmean):   3.01 cm     PV Peak grad:     1.9 mmHg AV Area (VTI):     3.72 cm     PR End Diast Vel: 2.72 msec AV Vmax:           127.00 cm/s AV Vmean:          90.000 cm/s AV VTI:            0.285 m AV Peak Grad:      6.5 mmHg AV Mean Grad:      4.0 mmHg LVOT Vmax:         109.00 cm/s LVOT Vmean:        71.300 cm/s LVOT VTI:          0.279 m LVOT/AV VTI ratio: 0.98 AI PHT:            779 msec  AORTA Ao Root diam: 3.30 cm Ao Asc diam:  3.40 cm MITRAL VALVE                TRICUSPID VALVE MV Area (PHT): 2.45 cm    TR Peak grad:   20.8 mmHg MV Area VTI:   4.95 cm    TR Vmax:        228.00 cm/s MV Peak  grad:  2.1 mmHg MV Mean grad:  1.0 mmHg    SHUNTS MV Vmax:       0.72 m/s    Systemic VTI:  0.28 m MV Vmean:      52.7 cm/s   Systemic Diam: 2.20 cm MV Decel Time: 310 msec MV E velocity: 65.60 cm/s MV A velocity: 83.10 cm/s MV E/A ratio:  0.79 Gloriann Larger MD Electronically signed by Gloriann Larger MD Signature Date/Time: 03/15/2024/12:34:29 PM    Final    NM PET Image Initial (PI) Skull Base To Thigh Result Date: 03/12/2024 CLINICAL DATA:  Initial treatment strategy for nodular lymphocytic Hodgkin's lymphoma. EXAM: NUCLEAR MEDICINE PET SKULL BASE TO THIGH TECHNIQUE: 9.8 mCi F-18 FDG was injected intravenously. Full-ring PET imaging was performed from the skull base to thigh after the radiotracer. CT data was obtained and used for attenuation correction and anatomic localization. Fasting blood glucose: 91 mg/dl COMPARISON:  Thyroid  ultrasound 12/29/2023. FINDINGS: Mediastinal blood pool activity: SUV max 2.5 Liver activity: SUV max 3.1 NECK: There are several hypermetabolic nodes identified in the right side of the neck. These involve nodes in jugular chain zone 2, 3, 4. Example includes focus maximum SUV value of 13.2 corresponding to the node on image 26 measuring 11 x 13 mm. Node slightly more anterior and caudal maximum SUV value of 8.7, measures on image 35 at 12 by 17 mm. Thirty example seen even more caudal immediately posterior to the sternocleidomastoid muscle with maximum SUV of 8.0 and dimension on image 42 measuring 9 by 14 mm. There is some patchy uptake involving the enlarged right thyroid  gland with maximum SUV of 12.4. Please correlate with previous thyroid  biopsy from 11/30/2023. No abnormal uptake along the left side of the neck lymph nodes. Near symmetric uptake of the visualized intracranial compartment. Incidental CT findings: Paranasal sinuses  and mastoid air cells are clear. Streak artifact related to the patient's dental hardware. CHEST: Abnormal uptake within an upper mediastinal lymph node on the right side, right paratracheal. Maximum SUV of 8.7. Node measures 18 x 10 mm on series 4, image 54. Additional abnormal node more anteriorly in the upper mediastinum with uptake approaching 5.4 SUV maximum. Focus on image 56 is immediately caudal to the isthmus of the thyroid  gland anterior to the trachea. Node just to the left of this on image 56 is also noted. No additional areas of abnormal uptake above blood pool in the axillary region, hilum or elsewhere along the mediastinum. No abnormal lung uptake. Incidental CT findings: Breathing motion. There is some linear opacity lung bases likely scar or atelectasis. There is a 3 mm right upper lobe apical nodule medially on image 58. No abnormal uptake but quite small and may be below threshold. Coronary artery calcifications are seen. Heart is nonenlarged. No pericardial effusion. Slightly patulous esophagus. ABDOMEN/PELVIS: There is physiologic uptake seen along the parenchymal organs, bowel and renal collecting systems. Specifically no abnormal splenic uptake. Longitudinal length the spleen measures up to 10.7 cm. Incidental CT findings: Scattered vascular calcifications are identified along the aorta and branch vessels. Enlarged prostate with mass effect along the base of the bladder. No abnormal uptake. Bladder wall slightly thickened. Please correlate with any symptoms and BPH. Please correlate with the patient's PSA. Scattered colonic stool. Normal appendix. Stomach and small bowel are nondilated. Small fat containing umbilical hernias. Multiple benign-appearing hepatic cystic foci. Gallbladder is nondilated. SKELETON: No abnormal uptake along the visualized osseous structures. Physiologic muscle uptake identified along the right shoulder and  chest. Incidental CT findings: Scattered degenerative changes  noted. Trace retrolisthesis at L2-3 and L1-2. IMPRESSION: Multiple hypermetabolic right-sided neck lymph nodes along the jugular chain from zone 2 through zone 4. Additional abnormal nodes in the extreme upper mediastinum occluding paratracheal. Heterogeneous abnormal uptake along the right thyroid  lobe. Please correlate with previous thyroid  biopsy. No additional areas of abnormal uptake including no additional nodes in the chest, abdomen pelvis. Spleen is normal in size without abnormal uptake. Tiny right apical lung nodule without abnormal uptake. Please correlate for any prior to assess stability or recommend follow up in 1 year or sooner as per the patient's history of lymphoma. Electronically Signed   By: Adrianna Horde M.D.   On: 03/12/2024 17:02      IMPRESSION/PLAN:     Nodular lymphocyte predominant Hodgkin's lymphoma, stage IIB Stage IIB with right-sided neck and upper right mediastinum lymph node involvement. B Symptoms improved post-excision. Radiation therapy considered curative and may be preferred over chemotherapy due to lower toxicity, although NCCN guidelines suggest chemo + Rituxan + ISRT should be considered for patients with B symptoms.  I will discuss with Dr. Rosaline Coma. Patient is enthusiastic about avoiding chemotherapy if at all possible - Schedule radiation planning with IV contrast early next week. - Administer three weeks of radiation therapy, five days a week, targeting right neck and upper mediastinum. - Monitor for side effects: fatigue, taste changes, skin irritation, dry mouth. - Ensure adequate nutrition; recommend protein shakes like Boost, Ensure, or Carnation Instant Breakfast. - Consult nutritionist for dietary support. - Check recent TSH lab to assess thyroid  function before radiation. - Discussed additional potential side effects: temporary hair loss, taste changes, dry mouth. - Advise using electric razor during radiation. - Encourage wearing wide-brimmed hat and  gaiter for sun protection. - Coordinate with Dr. Rosaline Coma for follow-up PET scans to monitor remission.  As mentioned above, I will discuss the patient's B symptoms with medical oncology to make sure that chemotherapy is not recommended for his disease before we start radiation planning. -Consent form was signed today and patient is pleased with this plan  On date of service, in total, I spent 60 minutes on this encounter. Patient was seen in person.   __________________________________________   Colie Dawes, MD

## 2024-04-01 ENCOUNTER — Ambulatory Visit
Admission: RE | Admit: 2024-04-01 | Discharge: 2024-04-01 | Disposition: A | Discharge status: Home / Self Care | Source: Ambulatory Visit | Attending: Radiation Oncology | Admitting: Radiation Oncology

## 2024-04-01 DIAGNOSIS — Z79620 Long term (current) use of immunosuppressive biologic: Secondary | ICD-10-CM | POA: Diagnosis not present

## 2024-04-01 DIAGNOSIS — C8101 Nodular lymphocyte predominant Hodgkin lymphoma, lymph nodes of head, face, and neck: Secondary | ICD-10-CM | POA: Diagnosis not present

## 2024-04-01 DIAGNOSIS — C8108 Nodular lymphocyte predominant Hodgkin lymphoma, lymph nodes of multiple sites: Secondary | ICD-10-CM | POA: Diagnosis not present

## 2024-04-01 DIAGNOSIS — Z510 Encounter for antineoplastic radiation therapy: Secondary | ICD-10-CM | POA: Insufficient documentation

## 2024-04-01 NOTE — Progress Notes (Signed)
 Oncology Nurse Navigator Documentation   Mr. Crable presented for CT simulation today. He tolerated procedure the procedure well. He will start on 6/16 and knows to call me if he has any questions or concerns before or during his treatment.   Lynetta Saran RN, BSN, OCN Head & Neck Oncology Nurse Navigator [facility] at [facility] Phone # [phone]  Fax # [phone]

## 2024-04-01 NOTE — Progress Notes (Signed)
 Has armband been applied?  Yes.    Does patient have an allergy to IV contrast dye?: No.   Has patient ever received premedication for IV contrast dye?: No.   Date of lab work: 03/22/2024 BUN: 18 CR: 1.33 eGFR: 55  Does patient take metformin?: No.  Is eGFR >60?: No. If no, when can patient resume? (Must be 48 hrs AFTER they receive IV contrast):    IV site: Left AC  Has IV site been added to flowsheet?  Yes.    There were no vitals taken for this visit.

## 2024-04-03 ENCOUNTER — Other Ambulatory Visit: Payer: Self-pay

## 2024-04-03 DIAGNOSIS — R5381 Other malaise: Secondary | ICD-10-CM

## 2024-04-03 DIAGNOSIS — C8101 Nodular lymphocyte predominant Hodgkin lymphoma, lymph nodes of head, face, and neck: Secondary | ICD-10-CM

## 2024-04-03 NOTE — Progress Notes (Signed)
 Oncology Nurse Navigator Documentation    I received a message from Dr. Lurena Sally that she would like Geoffrey Duran to have a TSH, free T4 completed before he starts treatment on 6/16. I called and spoke to him and he's agreeable to come tomorrow morning for the lab. The appointment has been scheduled and viewable in Mychart. He and his wife have my number and know to call me if they have any questions or concerns as he proceeds through treatment.   Lynetta Saran RN, BSN, OCN Head & Neck Oncology Nurse Navigator [facility] at [facility] Phone # [phone]  Fax # [phone]

## 2024-04-04 ENCOUNTER — Ambulatory Visit
Admission: RE | Admit: 2024-04-04 | Discharge: 2024-04-04 | Disposition: A | Discharge status: Home / Self Care | Source: Ambulatory Visit | Attending: Radiation Oncology | Admitting: Radiation Oncology

## 2024-04-04 DIAGNOSIS — Z79620 Long term (current) use of immunosuppressive biologic: Secondary | ICD-10-CM | POA: Diagnosis not present

## 2024-04-04 DIAGNOSIS — R5381 Other malaise: Secondary | ICD-10-CM

## 2024-04-04 DIAGNOSIS — C8101 Nodular lymphocyte predominant Hodgkin lymphoma, lymph nodes of head, face, and neck: Secondary | ICD-10-CM

## 2024-04-04 DIAGNOSIS — C8108 Nodular lymphocyte predominant Hodgkin lymphoma, lymph nodes of multiple sites: Secondary | ICD-10-CM | POA: Diagnosis not present

## 2024-04-04 DIAGNOSIS — Z510 Encounter for antineoplastic radiation therapy: Secondary | ICD-10-CM | POA: Diagnosis not present

## 2024-04-04 LAB — TSH: TSH: 9.75 u[IU]/mL — ABNORMAL HIGH (ref 0.350–4.500)

## 2024-04-04 LAB — T4, FREE: Free T4: 0.65 ng/dL (ref 0.61–1.12)

## 2024-04-05 DIAGNOSIS — Z79620 Long term (current) use of immunosuppressive biologic: Secondary | ICD-10-CM | POA: Diagnosis not present

## 2024-04-05 DIAGNOSIS — C8101 Nodular lymphocyte predominant Hodgkin lymphoma, lymph nodes of head, face, and neck: Secondary | ICD-10-CM | POA: Diagnosis not present

## 2024-04-05 DIAGNOSIS — C8108 Nodular lymphocyte predominant Hodgkin lymphoma, lymph nodes of multiple sites: Secondary | ICD-10-CM | POA: Diagnosis not present

## 2024-04-05 DIAGNOSIS — Z510 Encounter for antineoplastic radiation therapy: Secondary | ICD-10-CM | POA: Diagnosis not present

## 2024-04-08 ENCOUNTER — Ambulatory Visit
Admission: RE | Admit: 2024-04-08 | Discharge: 2024-04-08 | Disposition: A | Discharge status: Home / Self Care | Source: Ambulatory Visit | Attending: Radiation Oncology | Admitting: Radiation Oncology

## 2024-04-08 ENCOUNTER — Other Ambulatory Visit: Payer: Self-pay

## 2024-04-08 DIAGNOSIS — Z510 Encounter for antineoplastic radiation therapy: Secondary | ICD-10-CM | POA: Diagnosis not present

## 2024-04-08 DIAGNOSIS — Z79620 Long term (current) use of immunosuppressive biologic: Secondary | ICD-10-CM | POA: Diagnosis not present

## 2024-04-08 DIAGNOSIS — C8108 Nodular lymphocyte predominant Hodgkin lymphoma, lymph nodes of multiple sites: Secondary | ICD-10-CM | POA: Diagnosis not present

## 2024-04-08 DIAGNOSIS — C8101 Nodular lymphocyte predominant Hodgkin lymphoma, lymph nodes of head, face, and neck: Secondary | ICD-10-CM | POA: Diagnosis not present

## 2024-04-08 LAB — RAD ONC ARIA SESSION SUMMARY
Course Elapsed Days: 0
Plan Fractions Treated to Date: 1
Plan Prescribed Dose Per Fraction: 2 Gy
Plan Total Fractions Prescribed: 15
Plan Total Prescribed Dose: 30 Gy
Reference Point Dosage Given to Date: 2 Gy
Reference Point Session Dosage Given: 2 Gy
Session Number: 1

## 2024-04-08 NOTE — Progress Notes (Signed)
 Oncology Nurse Navigator Documentation   To provide support, encouragement and care continuity, met with Geoffrey Duran for his initial RT.  He was accompanied by his wife. I reviewed the 2-step treatment process, answered questions.  Mr. Brosseau completed treatment without difficulty, denied questions/concerns. I reviewed the registration/arrival procedure for subsequent treatments. I encouraged them to call me with questions/concerns as treatments proceed.   Lynetta Saran RN, BSN, OCN Head & Neck Oncology Nurse Navigator [facility] at [facility] Phone # [phone]  Fax # [phone]

## 2024-04-09 ENCOUNTER — Other Ambulatory Visit: Payer: Self-pay

## 2024-04-09 ENCOUNTER — Ambulatory Visit
Admission: RE | Admit: 2024-04-09 | Discharge: 2024-04-09 | Disposition: A | Discharge status: Home / Self Care | Source: Ambulatory Visit | Attending: Radiation Oncology | Admitting: Radiation Oncology

## 2024-04-09 DIAGNOSIS — Z510 Encounter for antineoplastic radiation therapy: Secondary | ICD-10-CM | POA: Diagnosis not present

## 2024-04-09 DIAGNOSIS — Z79620 Long term (current) use of immunosuppressive biologic: Secondary | ICD-10-CM | POA: Diagnosis not present

## 2024-04-09 DIAGNOSIS — C8108 Nodular lymphocyte predominant Hodgkin lymphoma, lymph nodes of multiple sites: Secondary | ICD-10-CM | POA: Diagnosis not present

## 2024-04-09 DIAGNOSIS — C8101 Nodular lymphocyte predominant Hodgkin lymphoma, lymph nodes of head, face, and neck: Secondary | ICD-10-CM | POA: Diagnosis not present

## 2024-04-09 LAB — RAD ONC ARIA SESSION SUMMARY
Course Elapsed Days: 1
Plan Fractions Treated to Date: 2
Plan Prescribed Dose Per Fraction: 2 Gy
Plan Total Fractions Prescribed: 15
Plan Total Prescribed Dose: 30 Gy
Reference Point Dosage Given to Date: 4 Gy
Reference Point Session Dosage Given: 2 Gy
Session Number: 2

## 2024-04-10 ENCOUNTER — Other Ambulatory Visit: Payer: Self-pay

## 2024-04-10 ENCOUNTER — Ambulatory Visit
Admission: RE | Admit: 2024-04-10 | Discharge: 2024-04-10 | Disposition: A | Discharge status: Home / Self Care | Source: Ambulatory Visit | Attending: Radiation Oncology | Admitting: Radiation Oncology

## 2024-04-10 DIAGNOSIS — Z510 Encounter for antineoplastic radiation therapy: Secondary | ICD-10-CM | POA: Diagnosis not present

## 2024-04-10 DIAGNOSIS — Z79620 Long term (current) use of immunosuppressive biologic: Secondary | ICD-10-CM | POA: Diagnosis not present

## 2024-04-10 DIAGNOSIS — C8108 Nodular lymphocyte predominant Hodgkin lymphoma, lymph nodes of multiple sites: Secondary | ICD-10-CM | POA: Diagnosis not present

## 2024-04-10 DIAGNOSIS — C8101 Nodular lymphocyte predominant Hodgkin lymphoma, lymph nodes of head, face, and neck: Secondary | ICD-10-CM | POA: Diagnosis not present

## 2024-04-10 LAB — RAD ONC ARIA SESSION SUMMARY
Course Elapsed Days: 2
Plan Fractions Treated to Date: 3
Plan Prescribed Dose Per Fraction: 2 Gy
Plan Total Fractions Prescribed: 15
Plan Total Prescribed Dose: 30 Gy
Reference Point Dosage Given to Date: 6 Gy
Reference Point Session Dosage Given: 2 Gy
Session Number: 3

## 2024-04-11 ENCOUNTER — Ambulatory Visit
Admission: RE | Admit: 2024-04-11 | Discharge: 2024-04-11 | Disposition: A | Discharge status: Home / Self Care | Source: Ambulatory Visit | Attending: Radiation Oncology

## 2024-04-11 ENCOUNTER — Inpatient Hospital Stay: Attending: Physician Assistant | Admitting: Nutrition

## 2024-04-11 ENCOUNTER — Other Ambulatory Visit: Payer: Self-pay

## 2024-04-11 DIAGNOSIS — Z79620 Long term (current) use of immunosuppressive biologic: Secondary | ICD-10-CM | POA: Diagnosis not present

## 2024-04-11 DIAGNOSIS — C8108 Nodular lymphocyte predominant Hodgkin lymphoma, lymph nodes of multiple sites: Secondary | ICD-10-CM | POA: Diagnosis not present

## 2024-04-11 DIAGNOSIS — C8101 Nodular lymphocyte predominant Hodgkin lymphoma, lymph nodes of head, face, and neck: Secondary | ICD-10-CM | POA: Diagnosis not present

## 2024-04-11 DIAGNOSIS — Z510 Encounter for antineoplastic radiation therapy: Secondary | ICD-10-CM | POA: Diagnosis not present

## 2024-04-11 LAB — RAD ONC ARIA SESSION SUMMARY
Course Elapsed Days: 3
Plan Fractions Treated to Date: 4
Plan Prescribed Dose Per Fraction: 2 Gy
Plan Total Fractions Prescribed: 15
Plan Total Prescribed Dose: 30 Gy
Reference Point Dosage Given to Date: 8 Gy
Reference Point Session Dosage Given: 2 Gy
Session Number: 4

## 2024-04-11 NOTE — Progress Notes (Signed)
 78 year old male diagnosed with Hodgkin's lymphoma and receiving 15 radiation treatments.  He is followed by Dr. Lurena Sally and Dr. Rosaline Coma.  Past medical history includes GERD, hard of hearing, hypertension, and thrombocytopenia.  Medications include Pepcid and Protonix.  Labs include creatinine 1.33 on May 30.  Height: 6 feet 0 inches. Weight: 197 pounds on June 16/[facility] BMI: 26.64.  Reports at usual body weight.  Denies weight loss, difficulty chewing or swallowing.  He has not experienced any nausea, vomiting, constipation, or diarrhea.  24-hour recall reveals patient generally eats oatmeal and apple juice with coffee at breakfast.  He consumes a [employer] sandwich or leftovers from the night before for lunch and usually meat with 2 vegetables at dinner.  He snacks on fruit or granola bars.  He loves 2% milk and ice cream and often will have ice cream at bedtime.  Most of their meals are cooked at home.  Overall he usually follows a low-sodium diet.  They have purchased boost with 250 cal and 20 g of protein as well as Ensure complete with 350 cal 30 g protein.  Nutrition diagnosis: Food and nutrition related knowledge deficit related to cancer and associated treatments as evidenced by no prior need for nutrition related information.  Intervention: Provided education on importance of weight maintenance during treatment. Consume small, frequent meals and snacks with adequate calories and protein. Suggested oral nutrition supplement midmorning or mid-afternoon. Email nutrition facts sheets at patient request.  Monitoring, evaluation, goals: Patient will tolerate adequate calories and protein to minimize loss of lean body mass.  Next visit: Tuesday, June 24, at 10:30 by telephone.  **Disclaimer: This note was dictated with voice recognition software. Similar sounding words can inadvertently be transcribed and this note may contain transcription errors which may not have been corrected upon  publication of note.**

## 2024-04-12 ENCOUNTER — Other Ambulatory Visit: Payer: Self-pay

## 2024-04-12 ENCOUNTER — Ambulatory Visit
Admission: RE | Admit: 2024-04-12 | Discharge: 2024-04-12 | Disposition: A | Discharge status: Home / Self Care | Source: Ambulatory Visit | Attending: Radiation Oncology | Admitting: Radiation Oncology

## 2024-04-12 DIAGNOSIS — Z510 Encounter for antineoplastic radiation therapy: Secondary | ICD-10-CM | POA: Diagnosis not present

## 2024-04-12 DIAGNOSIS — C8108 Nodular lymphocyte predominant Hodgkin lymphoma, lymph nodes of multiple sites: Secondary | ICD-10-CM | POA: Diagnosis not present

## 2024-04-12 DIAGNOSIS — Z79620 Long term (current) use of immunosuppressive biologic: Secondary | ICD-10-CM | POA: Diagnosis not present

## 2024-04-12 DIAGNOSIS — C8101 Nodular lymphocyte predominant Hodgkin lymphoma, lymph nodes of head, face, and neck: Secondary | ICD-10-CM | POA: Diagnosis not present

## 2024-04-12 LAB — RAD ONC ARIA SESSION SUMMARY
Course Elapsed Days: 4
Plan Fractions Treated to Date: 5
Plan Prescribed Dose Per Fraction: 2 Gy
Plan Total Fractions Prescribed: 15
Plan Total Prescribed Dose: 30 Gy
Reference Point Dosage Given to Date: 10 Gy
Reference Point Session Dosage Given: 2 Gy
Session Number: 5

## 2024-04-15 ENCOUNTER — Ambulatory Visit
Admission: RE | Admit: 2024-04-15 | Discharge: 2024-04-15 | Disposition: A | Discharge status: Home / Self Care | Source: Ambulatory Visit | Attending: Radiation Oncology | Admitting: Radiation Oncology

## 2024-04-15 ENCOUNTER — Other Ambulatory Visit: Payer: Self-pay

## 2024-04-15 DIAGNOSIS — C8108 Nodular lymphocyte predominant Hodgkin lymphoma, lymph nodes of multiple sites: Secondary | ICD-10-CM | POA: Diagnosis not present

## 2024-04-15 DIAGNOSIS — Z79620 Long term (current) use of immunosuppressive biologic: Secondary | ICD-10-CM | POA: Diagnosis not present

## 2024-04-15 DIAGNOSIS — C8101 Nodular lymphocyte predominant Hodgkin lymphoma, lymph nodes of head, face, and neck: Secondary | ICD-10-CM | POA: Diagnosis not present

## 2024-04-15 DIAGNOSIS — Z510 Encounter for antineoplastic radiation therapy: Secondary | ICD-10-CM | POA: Diagnosis not present

## 2024-04-15 LAB — RAD ONC ARIA SESSION SUMMARY
Course Elapsed Days: 7
Plan Fractions Treated to Date: 6
Plan Prescribed Dose Per Fraction: 2 Gy
Plan Total Fractions Prescribed: 15
Plan Total Prescribed Dose: 30 Gy
Reference Point Dosage Given to Date: 12 Gy
Reference Point Session Dosage Given: 2 Gy
Session Number: 6

## 2024-04-16 ENCOUNTER — Ambulatory Visit
Admission: RE | Admit: 2024-04-16 | Discharge: 2024-04-16 | Disposition: A | Discharge status: Home / Self Care | Source: Ambulatory Visit | Attending: Radiation Oncology

## 2024-04-16 ENCOUNTER — Inpatient Hospital Stay: Admitting: Nutrition

## 2024-04-16 ENCOUNTER — Other Ambulatory Visit: Payer: Self-pay

## 2024-04-16 DIAGNOSIS — C8108 Nodular lymphocyte predominant Hodgkin lymphoma, lymph nodes of multiple sites: Secondary | ICD-10-CM | POA: Diagnosis not present

## 2024-04-16 DIAGNOSIS — C8101 Nodular lymphocyte predominant Hodgkin lymphoma, lymph nodes of head, face, and neck: Secondary | ICD-10-CM | POA: Diagnosis not present

## 2024-04-16 DIAGNOSIS — Z510 Encounter for antineoplastic radiation therapy: Secondary | ICD-10-CM | POA: Diagnosis not present

## 2024-04-16 DIAGNOSIS — Z79620 Long term (current) use of immunosuppressive biologic: Secondary | ICD-10-CM | POA: Diagnosis not present

## 2024-04-16 LAB — RAD ONC ARIA SESSION SUMMARY
Course Elapsed Days: 8
Plan Fractions Treated to Date: 7
Plan Prescribed Dose Per Fraction: 2 Gy
Plan Total Fractions Prescribed: 15
Plan Total Prescribed Dose: 30 Gy
Reference Point Dosage Given to Date: 14 Gy
Reference Point Session Dosage Given: 2 Gy
Session Number: 7

## 2024-04-16 NOTE — Progress Notes (Signed)
 Nutrition follow-up completed with patient receiving radiation therapy for Hodgkin's lymphoma.  Weight is stable at 197.4 pounds on June 23.  Labs were reviewed.  Spoke with patient over the telephone.  He reports he is doing great.  He denies nausea, vomiting, constipation, and diarrhea.  He is not having any difficulty eating and his weight is stable.  Nutrition diagnosis:  Food and nutrition related knowledge deficit improved.  Intervention: Educated to continue same strategies for adequate calories and protein for weight maintenance. Encouraged him to reach out and contact me if he had any questions or concerns prior to our follow-up next week.  Monitoring, evaluation, goals: Patient will tolerate adequate calories and protein to minimize weight loss.  Next visit: Tuesday, July 1 over the telephone.  **Disclaimer: This note was dictated with voice recognition software. Similar sounding words can inadvertently be transcribed and this note may contain transcription errors which may not have been corrected upon publication of note.**

## 2024-04-16 NOTE — Progress Notes (Signed)
 Patient started radiation treatments 04/08/24 and is being followed by Delon Jefferson, RN, Head and Neck Navigator. I will continue to be available for any needs as Dr Lafonda navigator, but will sign off for now.

## 2024-04-17 ENCOUNTER — Other Ambulatory Visit: Payer: Self-pay

## 2024-04-17 ENCOUNTER — Ambulatory Visit
Admission: RE | Admit: 2024-04-17 | Discharge: 2024-04-17 | Disposition: A | Discharge status: Home / Self Care | Source: Ambulatory Visit | Attending: Radiation Oncology | Admitting: Radiation Oncology

## 2024-04-17 DIAGNOSIS — C8101 Nodular lymphocyte predominant Hodgkin lymphoma, lymph nodes of head, face, and neck: Secondary | ICD-10-CM | POA: Diagnosis not present

## 2024-04-17 DIAGNOSIS — Z79620 Long term (current) use of immunosuppressive biologic: Secondary | ICD-10-CM | POA: Diagnosis not present

## 2024-04-17 DIAGNOSIS — Z510 Encounter for antineoplastic radiation therapy: Secondary | ICD-10-CM | POA: Diagnosis not present

## 2024-04-17 DIAGNOSIS — C8108 Nodular lymphocyte predominant Hodgkin lymphoma, lymph nodes of multiple sites: Secondary | ICD-10-CM | POA: Diagnosis not present

## 2024-04-17 LAB — RAD ONC ARIA SESSION SUMMARY
Course Elapsed Days: 9
Plan Fractions Treated to Date: 8
Plan Prescribed Dose Per Fraction: 2 Gy
Plan Total Fractions Prescribed: 15
Plan Total Prescribed Dose: 30 Gy
Reference Point Dosage Given to Date: 16 Gy
Reference Point Session Dosage Given: 2 Gy
Session Number: 8

## 2024-04-18 ENCOUNTER — Ambulatory Visit
Admission: RE | Admit: 2024-04-18 | Discharge: 2024-04-18 | Disposition: A | Discharge status: Home / Self Care | Source: Ambulatory Visit | Attending: Radiation Oncology | Admitting: Radiation Oncology

## 2024-04-18 ENCOUNTER — Other Ambulatory Visit: Payer: Self-pay

## 2024-04-18 DIAGNOSIS — C8101 Nodular lymphocyte predominant Hodgkin lymphoma, lymph nodes of head, face, and neck: Secondary | ICD-10-CM | POA: Diagnosis not present

## 2024-04-18 DIAGNOSIS — Z79620 Long term (current) use of immunosuppressive biologic: Secondary | ICD-10-CM | POA: Diagnosis not present

## 2024-04-18 DIAGNOSIS — Z510 Encounter for antineoplastic radiation therapy: Secondary | ICD-10-CM | POA: Diagnosis not present

## 2024-04-18 DIAGNOSIS — C8108 Nodular lymphocyte predominant Hodgkin lymphoma, lymph nodes of multiple sites: Secondary | ICD-10-CM | POA: Diagnosis not present

## 2024-04-18 LAB — RAD ONC ARIA SESSION SUMMARY
Course Elapsed Days: 10
Plan Fractions Treated to Date: 9
Plan Prescribed Dose Per Fraction: 2 Gy
Plan Total Fractions Prescribed: 15
Plan Total Prescribed Dose: 30 Gy
Reference Point Dosage Given to Date: 18 Gy
Reference Point Session Dosage Given: 2 Gy
Session Number: 9

## 2024-04-19 ENCOUNTER — Other Ambulatory Visit: Payer: Self-pay

## 2024-04-19 ENCOUNTER — Ambulatory Visit
Admission: RE | Admit: 2024-04-19 | Discharge: 2024-04-19 | Disposition: A | Discharge status: Home / Self Care | Source: Ambulatory Visit | Attending: Radiation Oncology | Admitting: Radiation Oncology

## 2024-04-19 DIAGNOSIS — Z79620 Long term (current) use of immunosuppressive biologic: Secondary | ICD-10-CM | POA: Diagnosis not present

## 2024-04-19 DIAGNOSIS — C8108 Nodular lymphocyte predominant Hodgkin lymphoma, lymph nodes of multiple sites: Secondary | ICD-10-CM | POA: Diagnosis not present

## 2024-04-19 DIAGNOSIS — Z510 Encounter for antineoplastic radiation therapy: Secondary | ICD-10-CM | POA: Diagnosis not present

## 2024-04-19 DIAGNOSIS — C8101 Nodular lymphocyte predominant Hodgkin lymphoma, lymph nodes of head, face, and neck: Secondary | ICD-10-CM | POA: Diagnosis not present

## 2024-04-19 LAB — RAD ONC ARIA SESSION SUMMARY
Course Elapsed Days: 11
Plan Fractions Treated to Date: 10
Plan Prescribed Dose Per Fraction: 2 Gy
Plan Total Fractions Prescribed: 15
Plan Total Prescribed Dose: 30 Gy
Reference Point Dosage Given to Date: 20 Gy
Reference Point Session Dosage Given: 2 Gy
Session Number: 10

## 2024-04-22 ENCOUNTER — Ambulatory Visit
Admission: RE | Admit: 2024-04-22 | Discharge: 2024-04-22 | Disposition: A | Discharge status: Home / Self Care | Source: Ambulatory Visit | Attending: Radiation Oncology

## 2024-04-22 ENCOUNTER — Ambulatory Visit
Admission: RE | Admit: 2024-04-22 | Discharge: 2024-04-22 | Disposition: A | Discharge status: Home / Self Care | Source: Ambulatory Visit | Attending: Radiation Oncology | Admitting: Radiation Oncology

## 2024-04-22 ENCOUNTER — Other Ambulatory Visit: Payer: Self-pay | Admitting: Radiation Oncology

## 2024-04-22 ENCOUNTER — Other Ambulatory Visit: Payer: Self-pay

## 2024-04-22 DIAGNOSIS — C8101 Nodular lymphocyte predominant Hodgkin lymphoma, lymph nodes of head, face, and neck: Secondary | ICD-10-CM

## 2024-04-22 DIAGNOSIS — Z510 Encounter for antineoplastic radiation therapy: Secondary | ICD-10-CM | POA: Diagnosis not present

## 2024-04-22 DIAGNOSIS — Z79620 Long term (current) use of immunosuppressive biologic: Secondary | ICD-10-CM | POA: Diagnosis not present

## 2024-04-22 DIAGNOSIS — C8108 Nodular lymphocyte predominant Hodgkin lymphoma, lymph nodes of multiple sites: Secondary | ICD-10-CM | POA: Diagnosis not present

## 2024-04-22 LAB — RAD ONC ARIA SESSION SUMMARY
Course Elapsed Days: 14
Plan Fractions Treated to Date: 11
Plan Prescribed Dose Per Fraction: 2 Gy
Plan Total Fractions Prescribed: 15
Plan Total Prescribed Dose: 30 Gy
Reference Point Dosage Given to Date: 22 Gy
Reference Point Session Dosage Given: 2 Gy
Session Number: 11

## 2024-04-22 MED ORDER — LIDOCAINE VISCOUS HCL 2 % MT SOLN
OROMUCOSAL | 3 refills | Status: AC
Start: 2024-04-22 — End: ?

## 2024-04-23 ENCOUNTER — Inpatient Hospital Stay: Admitting: Nutrition

## 2024-04-23 ENCOUNTER — Other Ambulatory Visit: Payer: Self-pay

## 2024-04-23 ENCOUNTER — Ambulatory Visit
Admission: RE | Admit: 2024-04-23 | Discharge: 2024-04-23 | Disposition: A | Discharge status: Home / Self Care | Source: Ambulatory Visit | Attending: Radiation Oncology | Admitting: Radiation Oncology

## 2024-04-23 DIAGNOSIS — C8108 Nodular lymphocyte predominant Hodgkin lymphoma, lymph nodes of multiple sites: Secondary | ICD-10-CM | POA: Diagnosis not present

## 2024-04-23 DIAGNOSIS — Z79620 Long term (current) use of immunosuppressive biologic: Secondary | ICD-10-CM | POA: Insufficient documentation

## 2024-04-23 DIAGNOSIS — Z510 Encounter for antineoplastic radiation therapy: Secondary | ICD-10-CM | POA: Diagnosis not present

## 2024-04-23 DIAGNOSIS — C8101 Nodular lymphocyte predominant Hodgkin lymphoma, lymph nodes of head, face, and neck: Secondary | ICD-10-CM | POA: Diagnosis not present

## 2024-04-23 LAB — RAD ONC ARIA SESSION SUMMARY
Course Elapsed Days: 15
Plan Fractions Treated to Date: 12
Plan Prescribed Dose Per Fraction: 2 Gy
Plan Total Fractions Prescribed: 15
Plan Total Prescribed Dose: 30 Gy
Reference Point Dosage Given to Date: 24 Gy
Reference Point Session Dosage Given: 2 Gy
Session Number: 12

## 2024-04-23 NOTE — Progress Notes (Signed)
 Nutrition follow-up completed with patient receiving radiation therapy for Hodgkin's lymphoma.  Patient is scheduled to complete radiation therapy on Monday, July 7.  Weight is stable and documented as 197 pounds on June 30.  Patient reports he has started to experience some pain with swallowing.  Reports discussing with MD and receiving a prescription for lidocaine .  Patient is also having some thickened saliva.  Encouraged him to use baking soda salt water  gargle frequently throughout the day.  He denies difficulty chewing or swallowing at this time.  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  Nutrition diagnosis: Food and nutrition related knowledge deficit has resolved.  Encouraged patient to continue strategies for adequate calorie and protein intake.  There is no follow-up scheduled at this time but patient was encouraged to reach out to RD with any questions or concerns.  **Disclaimer: This note was dictated with voice recognition software. Similar sounding words can inadvertently be transcribed and this note may contain transcription errors which may not have been corrected upon publication of note.**

## 2024-04-24 ENCOUNTER — Other Ambulatory Visit: Payer: Self-pay

## 2024-04-24 ENCOUNTER — Ambulatory Visit
Admission: RE | Admit: 2024-04-24 | Discharge: 2024-04-24 | Disposition: A | Discharge status: Home / Self Care | Source: Ambulatory Visit | Attending: Radiation Oncology

## 2024-04-24 DIAGNOSIS — C8101 Nodular lymphocyte predominant Hodgkin lymphoma, lymph nodes of head, face, and neck: Secondary | ICD-10-CM | POA: Diagnosis not present

## 2024-04-24 DIAGNOSIS — C8108 Nodular lymphocyte predominant Hodgkin lymphoma, lymph nodes of multiple sites: Secondary | ICD-10-CM | POA: Diagnosis not present

## 2024-04-24 DIAGNOSIS — Z79620 Long term (current) use of immunosuppressive biologic: Secondary | ICD-10-CM | POA: Diagnosis not present

## 2024-04-24 DIAGNOSIS — Z510 Encounter for antineoplastic radiation therapy: Secondary | ICD-10-CM | POA: Diagnosis not present

## 2024-04-24 LAB — RAD ONC ARIA SESSION SUMMARY
Course Elapsed Days: 16
Plan Fractions Treated to Date: 13
Plan Prescribed Dose Per Fraction: 2 Gy
Plan Total Fractions Prescribed: 15
Plan Total Prescribed Dose: 30 Gy
Reference Point Dosage Given to Date: 26 Gy
Reference Point Session Dosage Given: 2 Gy
Session Number: 13

## 2024-04-25 ENCOUNTER — Other Ambulatory Visit: Payer: Self-pay

## 2024-04-25 ENCOUNTER — Ambulatory Visit
Admission: RE | Admit: 2024-04-25 | Discharge: 2024-04-25 | Disposition: A | Discharge status: Home / Self Care | Source: Ambulatory Visit | Attending: Radiation Oncology | Admitting: Radiation Oncology

## 2024-04-25 DIAGNOSIS — Z510 Encounter for antineoplastic radiation therapy: Secondary | ICD-10-CM | POA: Diagnosis not present

## 2024-04-25 DIAGNOSIS — Z79620 Long term (current) use of immunosuppressive biologic: Secondary | ICD-10-CM | POA: Diagnosis not present

## 2024-04-25 DIAGNOSIS — C8108 Nodular lymphocyte predominant Hodgkin lymphoma, lymph nodes of multiple sites: Secondary | ICD-10-CM | POA: Diagnosis not present

## 2024-04-25 DIAGNOSIS — C8101 Nodular lymphocyte predominant Hodgkin lymphoma, lymph nodes of head, face, and neck: Secondary | ICD-10-CM | POA: Diagnosis not present

## 2024-04-25 LAB — RAD ONC ARIA SESSION SUMMARY
Course Elapsed Days: 17
Plan Fractions Treated to Date: 14
Plan Prescribed Dose Per Fraction: 2 Gy
Plan Total Fractions Prescribed: 15
Plan Total Prescribed Dose: 30 Gy
Reference Point Dosage Given to Date: 28 Gy
Reference Point Session Dosage Given: 2 Gy
Session Number: 14

## 2024-04-29 ENCOUNTER — Ambulatory Visit
Admission: RE | Admit: 2024-04-29 | Discharge: 2024-04-29 | Disposition: A | Discharge status: Home / Self Care | Source: Ambulatory Visit | Attending: Radiation Oncology

## 2024-04-29 ENCOUNTER — Inpatient Hospital Stay

## 2024-04-29 ENCOUNTER — Other Ambulatory Visit: Payer: Self-pay

## 2024-04-29 ENCOUNTER — Other Ambulatory Visit: Payer: Self-pay | Admitting: Hematology and Oncology

## 2024-04-29 ENCOUNTER — Inpatient Hospital Stay (HOSPITAL_BASED_OUTPATIENT_CLINIC_OR_DEPARTMENT_OTHER): Admitting: Hematology and Oncology

## 2024-04-29 VITALS — BP 130/80 | HR 73 | Temp 98.0°F | Resp 14 | Wt 196.3 lb

## 2024-04-29 DIAGNOSIS — C8101 Nodular lymphocyte predominant Hodgkin lymphoma, lymph nodes of head, face, and neck: Secondary | ICD-10-CM

## 2024-04-29 DIAGNOSIS — C8108 Nodular lymphocyte predominant Hodgkin lymphoma, lymph nodes of multiple sites: Secondary | ICD-10-CM | POA: Diagnosis not present

## 2024-04-29 DIAGNOSIS — Z79620 Long term (current) use of immunosuppressive biologic: Secondary | ICD-10-CM | POA: Diagnosis not present

## 2024-04-29 DIAGNOSIS — R600 Localized edema: Secondary | ICD-10-CM | POA: Diagnosis not present

## 2024-04-29 DIAGNOSIS — Z510 Encounter for antineoplastic radiation therapy: Secondary | ICD-10-CM | POA: Diagnosis not present

## 2024-04-29 LAB — CBC WITH DIFFERENTIAL (CANCER CENTER ONLY)
Abs Immature Granulocytes: 0.03 K/uL (ref 0.00–0.07)
Basophils Absolute: 0 K/uL (ref 0.0–0.1)
Basophils Relative: 0 %
Eosinophils Absolute: 0 K/uL (ref 0.0–0.5)
Eosinophils Relative: 0 %
HCT: 38.6 % — ABNORMAL LOW (ref 39.0–52.0)
Hemoglobin: 13.2 g/dL (ref 13.0–17.0)
Immature Granulocytes: 0 %
Lymphocytes Relative: 14 %
Lymphs Abs: 1 K/uL (ref 0.7–4.0)
MCH: 29.2 pg (ref 26.0–34.0)
MCHC: 34.2 g/dL (ref 30.0–36.0)
MCV: 85.4 fL (ref 80.0–100.0)
Monocytes Absolute: 1.8 K/uL — ABNORMAL HIGH (ref 0.1–1.0)
Monocytes Relative: 25 %
Neutro Abs: 4.4 K/uL (ref 1.7–7.7)
Neutrophils Relative %: 61 %
Platelet Count: 163 K/uL (ref 150–400)
RBC: 4.52 MIL/uL (ref 4.22–5.81)
RDW: 13 % (ref 11.5–15.5)
WBC Count: 7.2 K/uL (ref 4.0–10.5)
nRBC: 0 % (ref 0.0–0.2)

## 2024-04-29 LAB — RAD ONC ARIA SESSION SUMMARY
Course Elapsed Days: 21
Plan Fractions Treated to Date: 15
Plan Prescribed Dose Per Fraction: 2 Gy
Plan Total Fractions Prescribed: 15
Plan Total Prescribed Dose: 30 Gy
Reference Point Dosage Given to Date: 30 Gy
Reference Point Session Dosage Given: 2 Gy
Session Number: 15

## 2024-04-29 LAB — CMP (CANCER CENTER ONLY)
ALT: 13 U/L (ref 0–44)
AST: 16 U/L (ref 15–41)
Albumin: 4.4 g/dL (ref 3.5–5.0)
Alkaline Phosphatase: 71 U/L (ref 38–126)
Anion gap: 7 (ref 5–15)
BUN: 24 mg/dL — ABNORMAL HIGH (ref 8–23)
CO2: 29 mmol/L (ref 22–32)
Calcium: 9.2 mg/dL (ref 8.9–10.3)
Chloride: 101 mmol/L (ref 98–111)
Creatinine: 1.12 mg/dL (ref 0.61–1.24)
GFR, Estimated: >60 mL/min (ref >60)
Glucose, Bld: 114 mg/dL — ABNORMAL HIGH (ref 70–99)
Potassium: 3.9 mmol/L (ref 3.5–5.1)
Sodium: 137 mmol/L (ref 135–145)
Total Bilirubin: 0.7 mg/dL (ref 0.0–1.2)
Total Protein: 7.5 g/dL (ref 6.5–8.1)

## 2024-04-29 LAB — LACTATE DEHYDROGENASE: LDH: 151 U/L (ref 98–192)

## 2024-04-29 NOTE — Progress Notes (Signed)
 Oncology Nurse Navigator Documentation   Met with Geoffrey Duran after final RT to offer support and to celebrate end of radiation treatment.   Provided verbal/written post-RT guidance: Importance of keeping all follow-up appts Importance of protecting treatment area from sun. Continuation of Sonafine application 2-3 times daily, application of antibiotic ointment to areas of raw skin; when supply of Sonafine exhausted transition to OTC lotion with vitamin E. He will see Dr. Federico later today for follow up and radiation oncology on 7/22.   Explained my role as navigator will continue for several more months, encouraged him to call me with needs/concerns.    Geoffrey Jefferson RN, BSN, OCN Head & Neck Oncology Nurse Navigator [facility] at [facility] Phone # [phone]  Fax # [phone]

## 2024-04-29 NOTE — Progress Notes (Unsigned)
 [facility] [phone]   [phone]  PROGRESS NOTE  Patient Care Team: [name] LABOR, MD as PCP - General (Internal Medicine)  Hematological/Oncological History # Nodular Lymphocyte Predominant Hodgkin's Lymphoma  Presented to PCP, Dr. Dorn Charlott with right neck and shoulder pain. Physical exam findings was notable for nodular mass in the right posterior cervical region.  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 Molecular genetic testing with AFIRMA, and returned with a result of benign  02/19/2024: Underwent excision of right posterior cervical lymph node. Pathology was consistent with nodular lymphocyte predoominant Hodgkin's lymphoma 03/11/2024: Establish care with [facility] Hematology/On[facility] with Dr Norleen Kidney.  04/29/2024: completed definitive radiation therapy.   Interval History:  Geoffrey Duran 78 y.o. male with medical history significant for Nodular lymphomocyte predominate Hodgkin's lymphoma who presents for a follow up visit. The patient's last visit was on 03/22/2024. In the interim since the last visit he completed radiation therapy.   On exam today Geoffrey Duran reports that radiation therapy went really well and he is pleased with the results.  He does have some redness and dryness of his skin at the site where he received the radiation and reports when he is outside and sweating it can have a burning sensation.  He notes that he is not having any further fevers, chills, sweats.  He reports that he feels a little bit weaker but his appetite is strong.  He notes that he has had steady weight and is drinking  protein shakes.  He notes that sometimes chewing and swallowing can be a little bit of a problem with the inflammation in his throat.  He notes that he is not having any issues with headaches.  He denies any further bumps or lumps.  Overall he feels like his treatment was successful with minimal side effects.  A full 10 point ROS was otherwise negative.   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 Jeffrie- follows- [facility] as needed for palpitations   Elevated liver function tests    GERD (gastroesophageal reflux disease)    HOH (hard of hearing)    wears HAs   Hypertension    Pneumonia    Premature atrial contraction    Thrombocytopenia (HCC)     SURGICAL HISTORY: Past Surgical History:  Procedure Laterality Date   bilateral rotator surgery     COLONOSCOPY WITH PROPOFOL  N/A 03/08/2016   Procedure: COLONOSCOPY WITH PROPOFOL ;  Surgeon: [name], MD;  Location: [facility] ENDOSCOPY;  Service: Endoscopy;  Laterality: N/A;   HERNIA REPAIR  1970   right inguinal hernia   LYMPH NODE BIOPSY Right 02/19/2024   Procedure: LYMPH NODE BIOPSY;  Surgeon: [name], MD;  Location: [facility] ORS;  Service: General;  Laterality: Right;  Excisional biopsy right posterior cervical lymph nodes    SOCIAL HISTORY: Social History   Socioeconomic History   Marital status: Married    Spouse name: Not on file   Number of children: Not on file   Years of education: Not on file  Highest education level: Not on file  Occupational History   Not on file  Tobacco Use   Smoking status: Former   Smokeless tobacco: Former    Types: Chew   Tobacco comments:    Smoked 4-5 years, quit 55 years ago.   Vaping Use   Vaping status: Never Used  Substance and Sexual Activity   Alcohol use: No    Alcohol/week: 0.0 standard drinks of alcohol   Drug use: No   Sexual activity: Yes  Other Topics  Concern   Not on file  Social History Narrative   Pt lives in [place] with spouse. 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   Social Drivers of Corporate investment banker Strain: Not on file  Food Insecurity: No Food Insecurity (03/26/2024)   Hunger Vital Sign    Worried About Running Out of Food in the Last Year: Never true    Ran Out of Food in the Last Year: Never true  Transportation Needs: No Transportation Needs (03/26/2024)   PRAPARE - Administrator, Civil Service (Medical): No    Lack of Transportation (Non-Medical): No  Physical Activity: Not on file  Stress: Not on file  Social Connections: Not on file  Intimate Partner Violence: Not At Risk (03/26/2024)   Humiliation, Afraid, Rape, and Kick questionnaire    Fear of Current or Ex-Partner: No    Emotionally Abused: No    Physically Abused: No    Sexually Abused: No    FAMILY HISTORY: Family History  Problem Relation Age of Onset   Sudden death Mother        Ventricular Fibrillation at age 27   Colon cancer Brother    Cancer Brother    Heart attack Neg Hx    Stroke Neg Hx     ALLERGIES:  is allergic to heparin .  MEDICATIONS:  Current Outpatient Medications  Medication Sig Dispense Refill   famotidine (PEPCID) 20 MG tablet Take 20 mg by mouth 2 (two) times daily.     lidocaine  (XYLOCAINE ) 2 % solution Patient: Mix 1part 2% viscous lidocaine , 1part H20. Swish & swallow 10mL of diluted mixture, 30min before meals and at bedtime, up to QID 200 mL 3   pantoprazole (PROTONIX) 40 MG tablet Take 40 mg by mouth daily as needed (acid reflux).     No current facility-administered medications for this visit.    REVIEW OF SYSTEMS:   Constitutional: ( - ) fevers, ( - )  chills , ( - ) night sweats Eyes: ( - ) blurriness of vision, ( - ) double vision, ( - ) watery eyes Ears, nose, mouth, throat, and face: ( - ) mucositis, ( - ) sore throat Respiratory: ( - ) cough, ( - ) dyspnea, ( - ) wheezes Cardiovascular:  ( - ) palpitation, ( - ) chest discomfort, ( - ) lower extremity swelling Gastrointestinal:  ( - ) nausea, ( - ) heartburn, ( - ) change in bowel habits Skin: ( - ) abnormal skin rashes Lymphatics: ( - ) new lymphadenopathy, ( - ) easy bruising Neurological: ( - ) numbness, ( - ) tingling, ( - ) new weaknesses Behavioral/Psych: ( - ) mood change, ( - ) new changes  All other systems were reviewed with the patient and are negative.  PHYSICAL EXAMINATION:  There were no vitals filed for this visit.  There were no vitals filed for this visit.   GENERAL: Well-appearing elderly Caucasian male, alert, no distress and  comfortable SKIN: skin color, texture, turgor are normal, no rashes or significant lesions EYES: conjunctiva are pink and non-injected, sclera clear OROPHARYNX: no exudate, no erythema; lips, buccal mucosa, and tongue normal  NECK: supple, non-tender LYMPH: Palpable right sided cervical lymphadenopathy, otherwise no palpable lymphadenopathy in the cervical, axillary or inguinal LUNGS: clear to auscultation and percussion with normal breathing effort HEART: regular rate & rhythm and no murmurs and no lower extremity edema Musculoskeletal: no cyanosis of digits and no clubbing  PSYCH: alert & oriented x 3, fluent speech NEURO: no focal motor/sensory deficits  LABORATORY DATA:  I have reviewed the data as listed    Latest Ref Rng & Units 03/22/2024    8:53 AM 03/11/2024    9:39 AM 02/16/2024    8:21 AM  CBC  WBC 4.0 - 10.5 K/uL 7.5  7.7  8.1   Hemoglobin 13.0 - 17.0 g/dL 87.2  86.5  86.1   Hematocrit 39.0 - 52.0 % 37.4  39.6  42.3   Platelets 150 - 400 K/uL 186  167  191        Latest Ref Rng & Units 03/22/2024    8:53 AM 03/11/2024    9:39 AM 02/16/2024    8:21 AM  CMP  Glucose 70 - 99 mg/dL 76  90  896   BUN 8 - 23 mg/dL 18  20  23    Creatinine 0.61 - 1.24 mg/dL 8.66  8.75  8.73   Sodium 135 - 145 mmol/L 140  139  136   Potassium 3.5 - 5.1 mmol/L 4.3  3.8  3.8    Chloride 98 - 111 mmol/L 104  103  100   CO2 22 - 32 mmol/L 31  29  25    Calcium 8.9 - 10.3 mg/dL 9.3  9.1  9.2   Total Protein 6.5 - 8.1 g/dL 7.4  7.7  7.8   Total Bilirubin 0.0 - 1.2 mg/dL 0.7  0.7  0.7   Alkaline Phos 38 - 126 U/L 76  77  73   AST 15 - 41 U/L 21  17  23    ALT 0 - 44 U/L 17  14  22      RADIOGRAPHIC STUDIES: I have personally reviewed the radiological images as listed and agreed with the findings in the report. No results found.   ASSESSMENT & PLAN Geoffrey Duran 78 y.o. male with medical history significant for Nodular lymphomocyte predominate Hodgkin's lymphoma who presents for a follow up visit.   #Nodular Lymphocyte Predominant Hodgkin's Lymphoma, Stage II continuous  --Underwent right posterior cervical lymph node excision on 02/19/2024 that confirmed diagnosis --PET CT scan shows limited stage disease, most consistent with a contiguous stage II disease. --Labs today show white blood cell 7.2, Hgb 13.2, MCV 85.4,Plt 163  --patient completed definitive radiation to the lymph nodes.  --In the event that patient were found to have residual/recurrent disease would consider chemotherapy with rituximab based regimen. -- resume monitoring to assure no recurrence. --RTC in 3 months with post treatment PET CT scan prior to visit.   No orders of the defined types were placed in this encounter.   All questions were answered. The patient knows to call the clinic with any problems, questions or concerns.  A total of more than 30 minutes were spent on this encounter with face-to-face time and non-face-to-face time, including preparing to see the patient, ordering tests and/or medications, counseling the patient and coordination of care as outlined above.   Geoffrey Duran T.  Federico, MD Department of Hematology/Oncology [facility] at [facility] Phone: [phone] Pager: [phone] Email: [email or web address]  04/29/2024 7:50 AM

## 2024-04-30 NOTE — Radiation Completion Notes (Signed)
 Patient Name: Geoffrey Duran, Geoffrey Duran MRN: [id number] Date of Birth: Apr 02, 1946 Referring Physician: NORLEEN KIDNEY, M.D. Date of Service: 2024-04-30 Radiation Oncologist: Lauraine Golden, M.D. [facility]                             RADIATION ONCOLOGY END OF TREATMENT NOTE     Diagnosis: C81.01 Nodular lymphocyte predominant hodgkin lymphoma, lymph nodes of head, face, and neck Intent: Curative     ==========DELIVERED PLANS==========  First Treatment Date: 2024-04-08 Last Treatment Date: 2024-04-29   Plan Name: HN_R_[email or web address] Site: Neck Right Technique: IMRT Mode: Photon Dose Per Fraction: 2 Gy Prescribed Dose (Delivered / Prescribed): 30 Gy / 30 Gy Prescribed Fxs (Delivered / Prescribed): 15 / 15     ==========ON TREATMENT VISIT DATES========== 2024-04-08, 2024-04-15, 2024-04-22     ==========UPCOMING VISITS========== 05/14/2024 [facility]-RADIATION ONC FOLLOW UP 20 [name], PA-C        ==========APPENDIX - ON TREATMENT VISIT NOTES==========   See weekly On Treatment Notes in Epic for details in the Media tab (listed as Progress notes on the On Treatment Visit Dates listed above).

## 2024-05-13 NOTE — Progress Notes (Signed)
  Diagnosis: C81.01  Parv Manthey presents today for a follow up visit with Leeroy Due PA-C.  Mr. Calica reports that radiation therapy went really well and he is pleased with the results.   Last Treatment Date: 2024-04-29   Pain issues, if any: Patient denies Using a feeding tube?: N/A Appetite: Good Weight changes, if any:  05/14/24 194.2 lb 04/29/24 196 lb 4.8 oz Swallowing issues, if any: He notes that sometimes chewing and swallowing can be a little bit of a problem with the inflammation in his throat.  Smoking or chewing tobacco? Patient denies Using fluoride toothpaste daily? Yes Last ENT visit was on:01/25/24 with Dr. Kateri Other notable issues, if any: None  BP (!) 151/80 (BP Location: Left Arm, Patient Position: Sitting, Cuff Size: Large)   Pulse (!) 52   Temp 97.8 F (36.6 C)   Resp 18   Ht 6' (1.829 m)   Wt 194 lb 3.2 oz (88.1 kg)   SpO2 100%   BMI 26.34 kg/m

## 2024-05-14 ENCOUNTER — Ambulatory Visit
Admission: RE | Admit: 2024-05-14 | Discharge: 2024-05-14 | Disposition: A | Discharge status: Home / Self Care | Source: Ambulatory Visit | Attending: Radiology | Admitting: Radiology

## 2024-05-14 VITALS — BP 151/80 | HR 52 | Temp 97.8°F | Resp 18 | Ht 72.0 in | Wt 194.2 lb

## 2024-05-14 DIAGNOSIS — C8101 Nodular lymphocyte predominant Hodgkin lymphoma, lymph nodes of head, face, and neck: Secondary | ICD-10-CM

## 2024-05-14 NOTE — Progress Notes (Signed)
 Radiation Oncology         [phone] ________________________________  Name: Geoffrey Duran MRN: [id number]  Date: 05/14/2024  DOB: 01/04/1946  Follow-Up Visit Note  CC: [name] LABOR, MD  [name] A, *  Diagnosis and Prior Radiotherapy:       ICD-10-CM   1. Nodular lymphocyte predominant Hodgkin lymphoma of lymph nodes of neck (HCC)  C81.01        Cancer Staging  Nodular lymphocyte predominant Hodgkin lymphoma (HCC) Staging form: Hodgkin and Non-Hodgkin Lymphoma, AJCC 8th Edition - Clinical stage from 03/26/2024: Stage II (Hodgkin lymphoma, B - Symptoms) - Signed by [name], MD on 03/26/2024 Stage prefix: Initial diagnosis  ==========DELIVERED PLANS==========  First Treatment Date: 2024-04-08 Last Treatment Date: 2024-04-29   Plan Name: HN_R_[email or web address] Site: Neck Right Technique: IMRT Mode: Photon Dose Per Fraction: 2 Gy Prescribed Dose (Delivered / Prescribed): 30 Gy / 30 Gy Prescribed Fxs (Delivered / Prescribed): 15 / 15  Stage IIB Hodgkin's lymphoma; s/p radiation completed on 04/30/2023  CHIEF COMPLAINT:  Here for follow-up and surveillance of Hodgkin's lymphoma.   Narrative:  The patient returns today for routine follow-up. He completed his radiation treatment on 04/29/2024.   Pain issues, if any: Patient denies Using a feeding tube?: N/A Appetite: Good Weight changes, if any:  05/14/24 194.2 lb 04/29/24 196 lb 4.8 oz Swallowing issues, if any: He notes that sometimes chewing and swallowing can be a little bit difficult, but he drinks plenty of water  and chews slowly to help with this.  Smoking or chewing tobacco? Patient denies Using fluoride toothpaste daily? Yes Last ENT visit was on:01/25/24 with Dr. Kateri Other notable issues, if any: He reports sensation returning to his right ear since the surgery.                  ALLERGIES:  is allergic to heparin .  Meds: Current Outpatient Medications  Medication Sig Dispense Refill    famotidine (PEPCID) 20 MG tablet Take 20 mg by mouth 2 (two) times daily.     pantoprazole (PROTONIX) 40 MG tablet Take 40 mg by mouth daily as needed (acid reflux).     lidocaine  (XYLOCAINE ) 2 % solution Patient: Mix 1part 2% viscous lidocaine , 1part H20. Swish & swallow 10mL of diluted mixture, before meals and at bedtime, up to QID (Patient not taking: Reported on 05/14/2024) 200 mL 3   No current facility-administered medications for this encounter.    Physical Findings: The patient is in no acute distress. Patient is alert and oriented. Wt Readings from Last 3 Encounters:  05/14/24 194 lb 3.2 oz (88.1 kg)  04/29/24 196 lb 4.8 oz (89 kg)  04/01/24 196 lb 6.4 oz (89.1 kg)    height is 6' (1.829 m) and weight is 194 lb 3.2 oz (88.1 kg). His temperature is 97.8 F (36.6 C). His blood pressure is 151/80 (abnormal) and his pulse is 52 (abnormal). His respiration is 18 and oxygen saturation is 100%. .  General: Alert and oriented, in no acute distress HEENT: Head is normocephalic. Extraocular movements are intact. Oropharynx is notable for no concerning lesions. Neck: Neck is notable for no palpable adenopathy or lymphedema Skin: Skin in treatment fields shows satisfactory healing, mild erythema and dryness Heart: Regular in rate and rhythm with no murmurs, rubs, or gallops. Chest: Clear to auscultation bilaterally, with no rhonchi, wheezes, or rales. Abdomen: Soft, nontender, nondistended, with no rigidity or guarding. Extremities: No cyanosis or edema. Lymphatics: see Neck Exam Psychiatric: Judgment  and insight are intact. Affect is appropriate.   Lab Findings: Lab Results  Component Value Date   WBC 7.2 04/29/2024   HGB 13.2 04/29/2024   HCT 38.6 (L) 04/29/2024   MCV 85.4 04/29/2024   PLT 163 04/29/2024    Lab Results  Component Value Date   TSH 9.750 (H) 04/04/2024    Radiographic Findings: No results found.  Impression/Plan:  Stage IIB Hodgkin's lymphoma; s/p  radiation completed on 04/30/2023  Patient is healing well from the effects of his radiation treatment. He is very pleased with his progress to date. 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   Patient is scheduled for restaging PET and follow-up with Dr. Federico at the end of September. We will add him to our tumor board at the beginning of October to also review his scan. Radiation follow-up PRN. We appreciate the opportunity to take part in this patient's care. He was encouraged to call with any questions or concerns.    On date of service, in total, I spent 20 minutes on this encounter. Patient was seen in person. _____________________________________    Leeroy Due, PA-C

## 2024-05-29 ENCOUNTER — Telehealth: Payer: Self-pay | Admitting: Hematology and Oncology

## 2024-06-25 ENCOUNTER — Telehealth: Payer: Self-pay | Admitting: *Deleted

## 2024-06-25 NOTE — Telephone Encounter (Signed)
 Contacted patient per request of Onc Navigator. Patient states he has had pain and sensation of pulling on right side of neck from shoulder up and over right ear. He said the pain/sensation has been there all the time, since before he started treatment. He said it has been uncomfortable but has tolerated it.  He said he's not sure if he ever described it to a provider because he was so used to it.  The pain has gotten worse since he completed radiation. Last week was the pain was the most intense it has ever been, 8-10/10. Painful to move his head or right arm. Was not able to sleep well last week due to pain. He states he never misses work and he missed several days of work.  He has been taking 2 tylenol  every 3-5 hours since last week. Over weekend, he started applying heat via a heating pad to his neck. The pain has decreased today as long as he does not move about too much, but said he can't continue tylenol  and heat indefinitely.   He is concerned and wants to be seen sooner than currently scheduled appt.  He wants to have his PET scan sooner if possible.   Advised him to not take more than 4000 gm of tylenol /24 hours and be careful with heat. He verbalized understanding. Informed him that the information he provided will be shared with providers and navigators

## 2024-07-01 ENCOUNTER — Other Ambulatory Visit: Payer: Self-pay | Admitting: Hematology and Oncology

## 2024-07-01 ENCOUNTER — Encounter (HOSPITAL_COMMUNITY)
Admission: RE | Admit: 2024-07-01 | Discharge: 2024-07-01 | Disposition: A | Discharge status: Home / Self Care | Source: Ambulatory Visit | Attending: Hematology and Oncology | Admitting: Hematology and Oncology

## 2024-07-01 DIAGNOSIS — K7689 Other specified diseases of liver: Secondary | ICD-10-CM | POA: Diagnosis not present

## 2024-07-01 DIAGNOSIS — C8101 Nodular lymphocyte predominant Hodgkin lymphoma, lymph nodes of head, face, and neck: Secondary | ICD-10-CM | POA: Insufficient documentation

## 2024-07-01 DIAGNOSIS — E041 Nontoxic single thyroid nodule: Secondary | ICD-10-CM | POA: Diagnosis not present

## 2024-07-01 DIAGNOSIS — R600 Localized edema: Secondary | ICD-10-CM

## 2024-07-01 DIAGNOSIS — R591 Generalized enlarged lymph nodes: Secondary | ICD-10-CM | POA: Diagnosis not present

## 2024-07-01 DIAGNOSIS — J320 Chronic maxillary sinusitis: Secondary | ICD-10-CM | POA: Diagnosis not present

## 2024-07-01 LAB — GLUCOSE, CAPILLARY: Glucose-Capillary: 108 mg/dL — ABNORMAL HIGH (ref 70–99)

## 2024-07-01 MED ORDER — FLUDEOXYGLUCOSE F - 18 (FDG) INJECTION
9.7200 | Freq: Once | INTRAVENOUS | Status: AC
  Administered 2024-07-01: 9.72 via INTRAVENOUS

## 2024-07-03 ENCOUNTER — Other Ambulatory Visit: Payer: Self-pay | Admitting: Hematology and Oncology

## 2024-07-03 DIAGNOSIS — C8101 Nodular lymphocyte predominant Hodgkin lymphoma, lymph nodes of head, face, and neck: Secondary | ICD-10-CM

## 2024-07-03 NOTE — Progress Notes (Unsigned)
 [facility] [phone]   [phone]) [phone]  PROGRESS NOTE  Patient Care Team: [name] LABOR, MD as PCP - General (Internal Medicine) Malmfelt, [name], RN as Oncology Nurse Navigator [name], MD as Consulting Physician (Radiation Oncology) [name] ONEIDA [name], MD as Consulting Physician (Hematology and Oncology)  Hematological/Oncological History # Nodular Lymphocyte Predominant Hodgkin's Lymphoma  Presented to PCP, Dr. Dorn Charlott with right neck and shoulder pain. Physical exam findings was notable for nodular mass in the right posterior cervical region.  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 Molecular genetic testing with AFIRMA, and returned with a result of benign  02/19/2024: Underwent excision of right posterior cervical lymph node. Pathology was consistent with nodular lymphocyte predoominant Hodgkin's lymphoma 03/11/2024: Establish care with [facility] Hematology/Oncology with Dr Norleen Federico.  04/29/2024: completed definitive radiation therapy.   Interval History:  Geoffrey Duran 78 y.o. male with medical history significant for Nodular lymphomocyte predominate Hodgkin's lymphoma who presents for a follow up visit. The patient's last visit was on 04/29/2024. In the interim since the last visit he underwent a PET CT scan which showed a complete response to radiation therapy.   On exam today Geoffrey Duran reports he has been well overall in the interim since her last visit.  He reports that he did have some difficulties with the right side of his neck reporting that it felt quite stiff and tense but has been improving.   He has been using heat and Tylenol  but not much in the way of stretches.  Radiation oncology recommended physical therapy which we agreed with as well.  He reports he is had no other symptoms such as bumps or lumps, fevers, chills, sweats.  He reports he does have to sleep in a certain position.  He notes that overall he feels quite well and has no signs or symptoms concerning for recurrent disease.  He denies any fever, chills, sweats.  A full 10 point ROS is otherwise negative.  The bulk of our discussion focused on the results of his scan and steps moving forward.  Details of our plan are noted below.  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 Jeffrie- follows- [facility] as needed for palpitations   Elevated liver function tests    GERD (gastroesophageal reflux disease)    HOH (hard of hearing)    wears HAs   Hypertension    Pneumonia    Premature atrial contraction    Thrombocytopenia (HCC)     SURGICAL HISTORY: Past Surgical History:  Procedure Laterality Date   bilateral rotator surgery     COLONOSCOPY WITH PROPOFOL  N/A 03/08/2016   Procedure: COLONOSCOPY WITH PROPOFOL ;  Surgeon: [name], MD;  Location: [facility];  Service: Endoscopy;  Laterality: N/A;   HERNIA REPAIR  1970   right inguinal hernia   LYMPH NODE BIOPSY Right 02/19/2024   Procedure: LYMPH NODE BIOPSY;  Surgeon: [name], MD;  Location: [facility] ORS;  Service: General;  Laterality: Right;  Excisional biopsy right posterior cervical lymph  nodes    SOCIAL HISTORY: Social History   Socioeconomic History   Marital status: Married    Spouse name: Not on file   Number of children: Not on file   Years of education: Not on file   Highest education level: Not on file  Occupational History   Not on file  Tobacco Use   Smoking status: Former   Smokeless tobacco: Former    Types: Chew    Tobacco comments:    Smoked 4-5 years, quit 55 years ago.   Vaping Use   Vaping status: Never Used  Substance and Sexual Activity   Alcohol use: No    Alcohol/week: 0.0 standard drinks of alcohol   Drug use: No   Sexual activity: Yes  Other Topics Concern   Not on file  Social History Narrative   Pt lives in [place] with spouse. 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   Social Drivers of Corporate investment banker Strain: Not on file  Food Insecurity: No Food Insecurity (03/26/2024)   Hunger Vital Sign    Worried About Running Out of Food in the Last Year: Never true    Ran Out of Food in the Last Year: Never true  Transportation Needs: No Transportation Needs (03/26/2024)   PRAPARE - Administrator, Civil Service (Medical): No    Lack of Transportation (Non-Medical): No  Physical Activity: Not on file  Stress: Not on file  Social Connections: Not on file  Intimate Partner Violence: Not At Risk (03/26/2024)   Humiliation, Afraid, Rape, and Kick questionnaire    Fear of Current or Ex-Partner: No    Emotionally Abused: No    Physically Abused: No    Sexually Abused: No    FAMILY HISTORY: Family History  Problem Relation Age of Onset   Sudden death Mother        Ventricular Fibrillation at age 65   Colon cancer Brother    Cancer Brother    Heart attack Neg Hx    Stroke Neg Hx     ALLERGIES:  is allergic to heparin .  MEDICATIONS:  Current Outpatient Medications  Medication Sig Dispense Refill   pantoprazole (PROTONIX) 40 MG tablet Take 40 mg by mouth daily as needed (acid reflux).     famotidine (PEPCID) 20 MG tablet Take 20 mg by mouth 2 (two) times daily.     lidocaine  (XYLOCAINE ) 2 % solution Patient: Mix 1part 2% viscous lidocaine , 1part H20. Swish & swallow 10mL of diluted mixture, before meals and at bedtime, up to QID (Patient not taking: Reported on 05/14/2024) 200 mL 3   No current facility-administered medications for this visit.    REVIEW  OF SYSTEMS:   Constitutional: ( - ) fevers, ( - )  chills , ( - ) night sweats Eyes: ( - ) blurriness of vision, ( - ) double vision, ( - ) watery eyes Ears, nose, mouth, throat, and face: ( - ) mucositis, ( - ) sore throat Respiratory: ( - ) cough, ( - ) dyspnea, ( - ) wheezes Cardiovascular: ( - ) palpitation, ( - ) chest discomfort, ( - ) lower extremity swelling Gastrointestinal:  ( - ) nausea, ( - ) heartburn, ( - ) change in bowel habits Skin: ( - ) abnormal skin rashes Lymphatics: ( - ) new lymphadenopathy, ( - ) easy bruising Neurological: ( - ) numbness, ( - ) tingling, ( - ) new weaknesses Behavioral/Psych: ( - ) mood change, ( - )  new changes  All other systems were reviewed with the patient and are negative.  PHYSICAL EXAMINATION:  Vitals:   07/04/24 0951  BP: (!) 142/80  Pulse: (!) 59  Resp: 14  Temp: 98.2 F (36.8 C)  SpO2: 100%    Filed Weights   07/04/24 0951  Weight: 197 lb 3.2 oz (89.4 kg)     GENERAL: Well-appearing elderly Caucasian male, alert, no distress and comfortable SKIN: skin color, texture, turgor are normal, no rashes or significant lesions EYES: conjunctiva are pink and non-injected, sclera clear OROPHARYNX: no exudate, no erythema; lips, buccal mucosa, and tongue normal  NECK: supple, non-tender LYMPH: Palpable right sided cervical lymphadenopathy, otherwise no palpable lymphadenopathy in the cervical, axillary or inguinal LUNGS: clear to auscultation and percussion with normal breathing effort HEART: regular rate & rhythm and no murmurs and no lower extremity edema Musculoskeletal: no cyanosis of digits and no clubbing  PSYCH: alert & oriented x 3, fluent speech NEURO: no focal motor/sensory deficits  LABORATORY DATA:  I have reviewed the data as listed    Latest Ref Rng & Units 07/04/2024    9:13 AM 04/29/2024   11:43 AM 03/22/2024    8:53 AM  CBC  WBC 4.0 - 10.5 K/uL 7.3  7.2  7.5   Hemoglobin 13.0 - 17.0 g/dL 87.3  86.7  87.2    Hematocrit 39.0 - 52.0 % 36.8  38.6  37.4   Platelets 150 - 400 K/uL 218  163  186        Latest Ref Rng & Units 07/04/2024    9:13 AM 04/29/2024   11:43 AM 03/22/2024    8:53 AM  CMP  Glucose 70 - 99 mg/dL 81  885  76   BUN 8 - 23 mg/dL 19  24  18    Creatinine 0.61 - 1.24 mg/dL 8.75  8.87  8.66   Sodium 135 - 145 mmol/L 138  137  140   Potassium 3.5 - 5.1 mmol/L 4.2  3.9  4.3   Chloride 98 - 111 mmol/L 101  101  104   CO2 22 - 32 mmol/L 31  29  31    Calcium 8.9 - 10.3 mg/dL 9.5  9.2  9.3   Total Protein 6.5 - 8.1 g/dL 7.6  7.5  7.4   Total Bilirubin 0.0 - 1.2 mg/dL 0.6  0.7  0.7   Alkaline Phos 38 - 126 U/L 82  71  76   AST 15 - 41 U/L 17  16  21    ALT 0 - 44 U/L 14  13  17      RADIOGRAPHIC STUDIES: I have personally reviewed the radiological images as listed and agreed with the findings in the report. NM PET Image Restage (PS) Skull Base to Thigh (F-18 FDG) Result Date: 07/01/2024 CLINICAL DATA:  Subsequent treatment strategy for nodular lymphocyte-predominant Hodgkin lymphoma. EXAM: NUCLEAR MEDICINE PET SKULL BASE TO THIGH TECHNIQUE: 9.7 mCi F-18 FDG was injected intravenously. Full-ring PET imaging was performed from the skull base to thigh after the radiotracer. CT data was obtained and used for attenuation correction and anatomic localization. Fasting blood glucose: 108 mg/dl COMPARISON:  4/79/7974 FINDINGS: Mediastinal blood pool activity: SUV max 2.7 Liver activity: SUV max 3.4 NECK: Diffuse thyroid  activity with some nodularity on the right side, previously evaluated by biopsy 11/30/2023. Previous hypermetabolic adenopathy in the right neck has resolved (Deauville 1). Incidental CT findings: Chronic left maxillary sinusitis. CHEST: Previous upper mediastinal hypermetabolic adenopathy has resolved (Deauville 1  Incidental CT findings: Atheromatous vascular calcification of the thoracic aorta left anterior descending coronary artery. Mild cardiomegaly. Stable 4 by 3 mm right upper lobe  nodule on image 13 series 7, too small to characterize by PET-CT. ABDOMEN/PELVIS: No significant abnormal hypermetabolic activity in this region. Incidental CT findings: Photopenic hepatic cysts. Nonspecific lobularity of the left kidney lower pole anterolaterally with density similar to the remaining renal parenchyma, possibly due to incidental parenchymal lobularity, complex cyst, or small mass. Possibilities for further workup may include surveillance in the context of the patient's anticipated oncology imaging; renal ultrasound; or most definitively, renal protocol MRI with and without contrast. Atherosclerosis is present, including aortoiliac atherosclerotic disease. Mild prostatomegaly. SKELETON: No significant abnormal hypermetabolic activity in this region. Incidental CT findings: Cervical and lumbar spondylosis. Mild grade 1 degenerative retrolisthesis at L2-3 and L3-4. IMPRESSION: 1. Previous hypermetabolic adenopathy in the right neck and upper mediastinum has resolved (Deauville 1). 2. Nonspecific lobularity of the left kidney lower pole anterolaterally with density similar to the remaining renal parenchyma, possibly due to incidental parenchymal lobularity, complex cyst, or small mass. Possibilities for further workup may include surveillance in the context of the patient's anticipated oncology imaging; renal ultrasound; or most definitively, renal protocol MRI with and without contrast. 3. Diffuse thyroid  activity with some nodularity on the right side, previously evaluated by biopsy 11/30/2023. 4. 4 mm right upper lobe nodule, too small to characterize by PET-CT, but stable. Surveillance suggested. 5. Nonspecific lobularity of the inferior pole of the left kidney. Possibilities include incidental parenchymal lobularity, mildly complex cyst, or a small mass. Most definitive workup would be renal protocol MRI with and without contrast. Alternatively this could be surveilled for changes in size or activity  in the context of future oncology imaging. 6. Chronic left maxillary sinusitis. 7. Mild cardiomegaly. 8.  Aortic Atherosclerosis (ICD10-I70.0). Electronically Signed   By: Ryan Salvage M.D.   On: 07/01/2024 09:57   ASSESSMENT & PLAN Geoffrey Duran 78 y.o. male with medical history significant for Nodular lymphomocyte predominate Hodgkin's lymphoma who presents for a follow up visit.   #Nodular Lymphocyte Predominant Hodgkin's Lymphoma, Stage II continuous  --Underwent right posterior cervical lymph node excision on 02/19/2024 that confirmed diagnosis --PET CT scan shows limited stage disease, most consistent with a contiguous stage II disease. --Posttreatment PET CT scan shows complete response to therapy Deauville Score 1.  Noted to have thyroid  activity which is currently being worked up as well as a small lobularity of the inferior pole of the left kidney.  Attention on this with our next scan. --Labs today show white blood cell 7.3, hemoglobin 12.6, MCV 84.6, platelets 218 --patient completed definitive radiation to the lymph nodes.  --In the event that patient were found to have residual/recurrent disease would consider chemotherapy with rituximab based regimen. -- resume monitoring to assure no recurrence. --RTC in 6 months with post treatment CT scan prior to visit.   Orders Placed This Encounter  Procedures   CT CHEST ABDOMEN PELVIS W CONTRAST    Standing Status:   Future    Expected Date:   12/23/2024    Expiration Date:   07/06/2025    If indicated for the ordered procedure, I authorize the administration of contrast media per Radiology protocol:   Yes    Does the patient have a contrast media/X-ray dye allergy?:   No    Preferred imaging location?:   [facility]    If indicated for the ordered procedure, I authorize the  administration of oral contrast media per Radiology protocol:   Yes   CT Soft Tissue Neck W Contrast    Standing Status:   Future    Expected Date:    12/23/2024    Expiration Date:   07/06/2025    If indicated for the ordered procedure, I authorize the administration of contrast media per Radiology protocol:   Yes    Does the patient have a contrast media/X-ray dye allergy?:   No    Preferred imaging location?:   [facility]   All questions were answered. The patient knows to call the clinic with any problems, questions or concerns.  A total of more than 30 minutes were spent on this encounter with face-to-face time and non-face-to-face time, including preparing to see the patient, ordering tests and/or medications, counseling the patient and coordination of care as outlined above.   Norleen IVAR Kidney, MD Department of Hematology/Oncology [facility] Phone: [phone] Pager: [phone] Email: [email or web address]  07/06/2024 5:02 PM

## 2024-07-04 ENCOUNTER — Inpatient Hospital Stay: Attending: Physician Assistant

## 2024-07-04 ENCOUNTER — Inpatient Hospital Stay (HOSPITAL_BASED_OUTPATIENT_CLINIC_OR_DEPARTMENT_OTHER): Admitting: Hematology and Oncology

## 2024-07-04 VITALS — BP 142/80 | HR 59 | Temp 98.2°F | Resp 14 | Wt 197.2 lb

## 2024-07-04 DIAGNOSIS — C8101 Nodular lymphocyte predominant Hodgkin lymphoma, lymph nodes of head, face, and neck: Secondary | ICD-10-CM

## 2024-07-04 DIAGNOSIS — Z87891 Personal history of nicotine dependence: Secondary | ICD-10-CM | POA: Insufficient documentation

## 2024-07-04 DIAGNOSIS — Z809 Family history of malignant neoplasm, unspecified: Secondary | ICD-10-CM | POA: Insufficient documentation

## 2024-07-04 DIAGNOSIS — Z79899 Other long term (current) drug therapy: Secondary | ICD-10-CM | POA: Insufficient documentation

## 2024-07-04 LAB — CMP (CANCER CENTER ONLY)
ALT: 14 U/L (ref 0–44)
AST: 17 U/L (ref 15–41)
Albumin: 4.4 g/dL (ref 3.5–5.0)
Alkaline Phosphatase: 82 U/L (ref 38–126)
Anion gap: 6 (ref 5–15)
BUN: 19 mg/dL (ref 8–23)
CO2: 31 mmol/L (ref 22–32)
Calcium: 9.5 mg/dL (ref 8.9–10.3)
Chloride: 101 mmol/L (ref 98–111)
Creatinine: 1.24 mg/dL (ref 0.61–1.24)
GFR, Estimated: 59 mL/min — ABNORMAL LOW (ref >60)
Glucose, Bld: 81 mg/dL (ref 70–99)
Potassium: 4.2 mmol/L (ref 3.5–5.1)
Sodium: 138 mmol/L (ref 135–145)
Total Bilirubin: 0.6 mg/dL (ref 0.0–1.2)
Total Protein: 7.6 g/dL (ref 6.5–8.1)

## 2024-07-04 LAB — CBC WITH DIFFERENTIAL (CANCER CENTER ONLY)
Abs Immature Granulocytes: 0.08 K/uL — ABNORMAL HIGH (ref 0.00–0.07)
Basophils Absolute: 0 K/uL (ref 0.0–0.1)
Basophils Relative: 0 %
Eosinophils Absolute: 0 K/uL (ref 0.0–0.5)
Eosinophils Relative: 0 %
HCT: 36.8 % — ABNORMAL LOW (ref 39.0–52.0)
Hemoglobin: 12.6 g/dL — ABNORMAL LOW (ref 13.0–17.0)
Immature Granulocytes: 1 %
Lymphocytes Relative: 15 %
Lymphs Abs: 1.1 K/uL (ref 0.7–4.0)
MCH: 29 pg (ref 26.0–34.0)
MCHC: 34.2 g/dL (ref 30.0–36.0)
MCV: 84.6 fL (ref 80.0–100.0)
Monocytes Absolute: 2.3 K/uL — ABNORMAL HIGH (ref 0.1–1.0)
Monocytes Relative: 31 %
Neutro Abs: 3.8 K/uL (ref 1.7–7.7)
Neutrophils Relative %: 53 %
Platelet Count: 218 K/uL (ref 150–400)
RBC: 4.35 MIL/uL (ref 4.22–5.81)
RDW: 13.2 % (ref 11.5–15.5)
WBC Count: 7.3 K/uL (ref 4.0–10.5)
nRBC: 0 % (ref 0.0–0.2)

## 2024-07-04 LAB — LACTATE DEHYDROGENASE: LDH: 153 U/L (ref 98–192)

## 2024-07-09 ENCOUNTER — Other Ambulatory Visit: Payer: Self-pay

## 2024-07-09 DIAGNOSIS — C8101 Nodular lymphocyte predominant Hodgkin lymphoma, lymph nodes of head, face, and neck: Secondary | ICD-10-CM

## 2024-07-17 ENCOUNTER — Other Ambulatory Visit: Payer: Self-pay

## 2024-07-17 ENCOUNTER — Ambulatory Visit: Attending: Radiation Oncology | Admitting: Physical Therapy

## 2024-07-17 ENCOUNTER — Encounter: Payer: Self-pay | Admitting: Physical Therapy

## 2024-07-17 DIAGNOSIS — C8101 Nodular lymphocyte predominant Hodgkin lymphoma, lymph nodes of head, face, and neck: Secondary | ICD-10-CM | POA: Diagnosis present

## 2024-07-17 DIAGNOSIS — L599 Disorder of the skin and subcutaneous tissue related to radiation, unspecified: Secondary | ICD-10-CM | POA: Diagnosis present

## 2024-07-17 DIAGNOSIS — M542 Cervicalgia: Secondary | ICD-10-CM | POA: Insufficient documentation

## 2024-07-17 NOTE — Therapy (Signed)
 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 Jeffrie- follows- [facility] as needed for palpitations   Elevated liver function tests    GERD (gastroesophageal reflux disease)    HOH (hard of hearing)    wears HAs   Hypertension    Pneumonia    Premature atrial contraction    Thrombocytopenia    Past Surgical History:  Procedure Laterality Date   bilateral rotator surgery     COLONOSCOPY WITH PROPOFOL  N/A 03/08/2016   Procedure: COLONOSCOPY WITH PROPOFOL ;  Surgeon: [name], MD;  Location: [facility];  Service: Endoscopy;  Laterality: N/A;   HERNIA REPAIR  1970   right inguinal hernia   LYMPH NODE BIOPSY Right 02/19/2024   Procedure: LYMPH NODE BIOPSY;  Surgeon: [name], MD;  Location: [facility];  Service: General;  Laterality: Right;  Excisional biopsy right posterior cervical lymph nodes   Patient Active Problem List   Diagnosis Date Noted   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   Cervical lymphadenopathy 02/18/2024   Essential hypertension 04/08/2015   Bradycardia 03/30/2015   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   Elevated LFTs 02/10/2014   Pneumonia 02/06/2014   Thrombocytopenia 02/06/2014   High anion gap metabolic acidosis 02/06/2014   Sepsis (HCC) 02/06/2014    PCP: Dorn Sauers, MD  REFERRING PROVIDER: [name], MD  REFERRING  DIAG: C81.01 (ICD-10-CM) - 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 If I drive [phone] miles a day I have to make sure my R arm is hanging down low or it is tough. It is always there. It goes up to the ear. It has been this way for 2 years.   PERTINENT HISTORY:  Nodular Lymphocyte Predominant Hodgkin's Lymphoma. Presented to PCP, Dr. Dorn Sauers with right neck and shoulder pain. Physical exam findings was notable for nodular mass in the right posterior cervical region.  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 Molecular genetic testing with AFIRMA, and returned with a result of benign. 02/19/2024: Underwent excision of right posterior cervical lymph node. Pathology was consistent with nodular lymphocyte predoominant Hodgkin's lymphoma. 03/11/2024: Establish care with [facility] Hematology/Oncology with Dr Norleen Kidney. 04/29/2024: completed definitive radiation therapy. Hx of bilateral rotator cuff repair. Imaging on 03/13/23: FINDINGS: The cervical spine is visualized from the occiput to the cervicothoracic junction. Exaggerated cervical  lordosis. Alignment is otherwise anatomic. Prevertebral soft tissues are within normal limits. Vertebral body and disc space heights are maintained. Multilevel uncovertebral hypertrophy and facet sclerosis. Severe right neural foraminal narrowing at C3-4 and C4-5. There may be minimal neural foraminal narrowing in the lower cervical spine on the left. Visualized lung apices are clear.  PATIENT GOALS:   to be educated about the signs and symptoms of lymphedema and learn post op HEP.   PAIN:  Are you having pain? No has pain with turning head to the L, 2/10 and 4/10 with turning to the right, sharp and aching, makes it better: heat and not turning the head, makes it worse: turning quick  PRECAUTIONS: risk of lymphedema  RED FLAGS: None   WEIGHT BEARING RESTRICTIONS: No  FALLS:  Has patient fallen in last 6 months? No Does the patient have a fear of falling that limits activity? 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  COGNITION: Overall cognitive status: Within functional limits for tasks assessed                  POSTURE:  Forward head and rounded shoulders posture  30 SEC SIT TO STAND: 13 reps in 30 sec without use of UEs which is  Average for patient's age  SHOULDER AROM:   WFL   CERVICAL AROM:   Percent limited  Flexion WFL  Extension 25% limited  Right lateral flexion 75% limited with pain  Left lateral flexion 75% limited  Right rotation 25% limited with pain  Left rotation 25% limited    (Blank rows=not tested)  LYMPHEDEMA ASSESSMENT:    Circumference in cm  4 cm superior to  sternal notch around neck 43  6 cm superior to sternal notch around neck 43  8 cm superior to sternal notch around neck 44  R lateral nostril from base of nose to medial tragus   L lateral nostril from base of nose to medial tragus   R corner of mouth to where ear lobe meets face   L corner of mouth to where ear lobe meets face         (Blank rows=not tested)  TREATMENT PERFORMED: 07/17/24: Instructed pt in neck and scapular retraction exercise,brief  gentle STM to L levator/upper traps and to lateral cervical muscles - also see education section  PATIENT EDUCATION:  Education details: Neck ROM, importance of posture when sitting, standing and lying down, how his foraminal narrowing in the cerivcal region could be causing the radicular pain in his R arm and how posture and muscle tightness can exacerbate this Person educated: Patient Education method: Explanation, Demonstration, Handout Education comprehension: Patient verbalized understanding and returned demonstration  HOME EXERCISE PROGRAM: Patient was  instructed today in a home exercise program today for head and neck range of motion exercises. These included neck and shoulder retraction. Patient was encouraged to do these 2-3 times a day, holding for 5 sec each and completing for 5 reps. Pt was educated that once this becomes easier then hold the stretches for 30-60 seconds.  Educated pt to avoid stretches or movements that cause increased pain   ASSESSMENT:  CLINICAL IMPRESSION: Pt reports to PT after completing radiation for nodular lymphocyte predominant hodgkin lymphoma of lymph nodes of neck. He reports a 2 years history of R sided neck pain radiating to his ear and down his arm. He reports it has improved some since the radiation but he still has increased pain if he turns to far to look over his shoulder, moves his neck in certain directions or moves too quickly. He has numerous areas of muscle tightness in bilateral levator  with R worse than left, upper traps, SCM and posterior and lateral cervical muscles. He would benefit from skilled PT services to decrease muscle tightness, instruct pt in stretches and strengthening exercises to help improve posture to improve pain.   Pt will benefit from skilled therapeutic intervention to improve on the following deficits: Decreased knowledge of precautions and postural dysfunction. Other deficits: decreased knowledge of condition, decreased ROM, decreased strength, increased fascial restrictions, increased muscle spasms, postural dysfunction, and pain  PT treatment/interventions: ADL/self-care home management, pt/family education, therapeutic exercise. Other interventions 97164- PT Re-evaluation, 97110-Therapeutic exercises, 97530- Therapeutic activity, [id number]- Neuromuscular re-education, 97535- Self Care, [id number]- Manual therapy, 97760- Orthotic Initial, [phone]- Orthotic/Prosthetic subsequent, and Patient/Family education  REHAB POTENTIAL: Good  CLINICAL DECISION MAKING: Stable/uncomplicated  EVALUATION COMPLEXITY: Low   GOALS: Goals reviewed with patient? YES  LONG TERM GOALS: (STG=LTG)   Name Target Date  Goal status  1 Patient will be able to verbalize understanding of a home exercise program for cervical range of motion, posture, and walking.   Baseline:  No knowledge 07/17/2024 Achieved at eval  2 Patient will be able to verbalize understanding of proper sitting and standing posture. Baseline:  No knowledge 07/17/2024 Achieved at eval  3 Patient will be able to verbalize understanding of lymphedema risk and availability of treatment for this condition Baseline:  No knowledge 07/17/2024 Achieved at eval  4 Pt to be able to turn his head to look over his left and right shoulder while driving without increased pain and spasm in his neck.   08/14/24 New  5 Pt will be independent in a home exercise program for continued stretching and strengthening to improve posture and  decrease neck pain. 08/14/24 NEW    6 Pt will demonstrate improved cervical lateral flexion and will only demonstrate 50% limitation instead of 75% limitation to allow improved motion/function 08/14/24 NEW    PLAN:  PT FREQUENCY/DURATION: 2x/wk for 4 wks  PLAN FOR NEXT SESSION: STM to upper traps, levator, lateral and posterior cervical muscles, how are retraction exercises, begin isometrics and stretching once there is decreased muscle tightness  [employer], PT 07/17/2024, 11:26 AM

## 2024-07-22 ENCOUNTER — Encounter (HOSPITAL_COMMUNITY)

## 2024-07-23 ENCOUNTER — Ambulatory Visit: Admitting: Physical Therapy

## 2024-07-23 ENCOUNTER — Encounter: Payer: Self-pay | Admitting: Physical Therapy

## 2024-07-23 DIAGNOSIS — C8101 Nodular lymphocyte predominant Hodgkin lymphoma, lymph nodes of head, face, and neck: Secondary | ICD-10-CM

## 2024-07-23 DIAGNOSIS — M542 Cervicalgia: Secondary | ICD-10-CM | POA: Diagnosis not present

## 2024-07-23 DIAGNOSIS — L599 Disorder of the skin and subcutaneous tissue related to radiation, unspecified: Secondary | ICD-10-CM

## 2024-07-23 NOTE — Therapy (Signed)
 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 Jeffrie- follows- [facility] as needed for palpitations   Elevated liver function tests    GERD (gastroesophageal reflux disease)    HOH (hard of hearing)    wears HAs   Hypertension    Pneumonia    Premature atrial contraction    Thrombocytopenia    Past Surgical History:  Procedure Laterality Date   bilateral rotator surgery     COLONOSCOPY WITH PROPOFOL  N/A 03/08/2016   Procedure: COLONOSCOPY WITH PROPOFOL ;  Surgeon: [name], MD;  Location: [facility];  Service: Endoscopy;  Laterality: N/A;   HERNIA REPAIR  1970   right inguinal hernia   LYMPH NODE BIOPSY Right 02/19/2024   Procedure: LYMPH NODE BIOPSY;  Surgeon: [name], MD;  Location: [facility];  Service: General;  Laterality: Right;  Excisional biopsy right posterior cervical lymph nodes   Patient Active Problem List   Diagnosis Date Noted   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   Cervical lymphadenopathy 02/18/2024   Essential hypertension 04/08/2015   Bradycardia 03/30/2015   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   Elevated LFTs 02/10/2014   Pneumonia 02/06/2014   Thrombocytopenia 02/06/2014   High anion gap metabolic acidosis 02/06/2014   Sepsis (HCC) 02/06/2014    PCP: Dorn Sauers, MD  REFERRING PROVIDER: [name], MD  REFERRING  DIAG: C81.01 (ICD-10-CM) - 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   PERTINENT HISTORY:  Nodular Lymphocyte Predominant Hodgkin's Lymphoma. Presented to PCP, Dr. Dorn Sauers with right neck and shoulder pain. Physical exam findings was notable for nodular mass in the right posterior cervical region.  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 Molecular genetic testing with AFIRMA, and returned with a result of benign. 02/19/2024: Underwent excision of right posterior cervical lymph node. Pathology was consistent with nodular lymphocyte predoominant Hodgkin's lymphoma. 03/11/2024: Establish care with [facility] Hematology/Oncology with Dr Norleen Kidney. 04/29/2024: completed definitive radiation therapy. Hx of bilateral rotator cuff repair. Imaging on 03/13/23: FINDINGS: The cervical spine is visualized from the occiput to  the cervicothoracic junction. Exaggerated cervical lordosis. Alignment is otherwise anatomic. Prevertebral soft tissues are within normal limits. Vertebral body and disc space heights are maintained. Multilevel uncovertebral hypertrophy and facet sclerosis. Severe right neural foraminal narrowing at C3-4 and C4-5. There may be minimal neural foraminal narrowing  in the lower cervical spine on the left. Visualized lung apices are clear.  PATIENT GOALS:   to be educated about the signs and symptoms of lymphedema and learn post op HEP.   PAIN:  Are you having pain? No just a tingle in the top of the shoulder, makes it better: heat and not turning the head, makes it worse: turning quick  PRECAUTIONS: risk of lymphedema  RED FLAGS: None   WEIGHT BEARING RESTRICTIONS: No  FALLS:  Has patient fallen in last 6 months? No Does the patient have a fear of falling that limits activity? 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  COGNITION: Overall cognitive status: Within functional limits for tasks assessed                  POSTURE:  Forward head and rounded shoulders posture  30 SEC SIT TO STAND: 13 reps in 30 sec without use of UEs which is  Average for patient's age  SHOULDER AROM:   WFL   CERVICAL AROM:   Percent limited  Flexion WFL  Extension 25% limited  Right lateral flexion 75% limited with pain  Left lateral flexion 75% limited  Right rotation 25% limited with pain  Left rotation 25% limited    (Blank rows=not tested)  LYMPHEDEMA ASSESSMENT:    Circumference in cm  4 cm superior to sternal notch around neck 43  6 cm superior to sternal notch around neck 43  8 cm superior to sternal notch around neck 44  R lateral nostril from base of nose to medial tragus   L lateral nostril from base of nose to medial tragus   R corner of mouth to where ear lobe meets face   L corner of mouth to where ear lobe meets face         (Blank rows=not  tested)  Neck Disability Index score: 10 / 50 = 20.0 %  TREATMENT PERFORMED: 07/23/24:  Manual Therapy: STM to R upper traps, rhomboids, levator, posterior and lateral cervical muscles using cocoa butter and WAVE tool in L side lying with numerous areas of muscle tightness noted with improvement noted by end of session PROM in bilateral rotation and bilateral lateral flexion with prolonged holds without the increased pain that pt had been having Educated pt in how to do self massage using a tennis ball on the wall to help decrease tightness in this muscles  07/17/24: Instructed pt in neck and scapular retraction exercise,brief  gentle STM to L levator/upper traps and to lateral cervical muscles - also see education section  PATIENT EDUCATION:  Education details: Neck ROM, importance of posture when sitting, standing and lying down, how his foraminal narrowing in the cerivcal region could be causing the radicular pain in his R arm and how posture and muscle tightness can exacerbate this Person educated: Patient Education method:  Explanation, Demonstration, Handout Education comprehension: Patient verbalized understanding and returned demonstration  HOME EXERCISE PROGRAM: Patient was instructed today in a home exercise program today for head and neck range of motion exercises. These included neck and shoulder retraction. Patient was encouraged to do these 2-3 times a day, holding for 5 sec each and completing for 5 reps. Pt was educated that once this becomes easier then hold the stretches for 30-60 seconds.  Educated pt to avoid stretches or movements that cause increased pain   ASSESSMENT:  CLINICAL IMPRESSION: Began manual therapy today including STM and PROM. Initially numerous areas of muscle tightness were noted in R upper traps, levator, and cervical muscles. This tightness improved with manual therapy today. Ended session with PROM with prolonged stretches and pt reports he has not  been able to turn his head that far in 5 years.   Pt will benefit from skilled therapeutic intervention to improve on the following deficits: Decreased knowledge of precautions and postural dysfunction. Other deficits: decreased knowledge of condition, decreased ROM, decreased strength, increased fascial restrictions, increased muscle spasms, postural dysfunction, and pain  PT treatment/interventions: ADL/self-care home management, pt/family education, therapeutic exercise. Other interventions 97164- PT Re-evaluation, 97110-Therapeutic exercises, 97530- Therapeutic activity, [id number]- Neuromuscular re-education, 97535- Self Care, [id number]- Manual therapy, 97760- Orthotic Initial, [phone]- Orthotic/Prosthetic subsequent, and Patient/Family education  REHAB POTENTIAL: Good  CLINICAL DECISION MAKING: Stable/uncomplicated  EVALUATION COMPLEXITY: Low   GOALS: Goals reviewed with patient? YES  LONG TERM GOALS: (STG=LTG)   Name Target Date  Goal status  1 Patient will be able to verbalize understanding of a home exercise program for cervical range of motion, posture, and walking.   Baseline:  No knowledge 07/17/2024 Achieved at eval  2 Patient will be able to verbalize understanding of proper sitting and standing posture. Baseline:  No knowledge 07/17/2024 Achieved at eval  3 Patient will be able to verbalize understanding of lymphedema risk and availability of treatment for this condition Baseline:  No knowledge 07/17/2024 Achieved at eval  4 Pt to be able to turn his head to look over his left and right shoulder while driving without increased pain and spasm in his neck.   08/14/24 New  5 Pt will be independent in a home exercise program for continued stretching and strengthening to improve posture and decrease neck pain. 08/14/24 NEW    6 Pt will demonstrate improved cervical lateral flexion and will only demonstrate 50% limitation instead of 75% limitation to allow improved motion/function 08/14/24  NEW    PLAN:  PT FREQUENCY/DURATION: 2x/wk for 4 wks  PLAN FOR NEXT SESSION: STM to upper traps, levator, lateral and posterior cervical muscles, how are retraction exercises, begin isometrics and stretching once there is decreased muscle tightness  [employer], PT 07/23/2024, 8:59 AM

## 2024-07-25 ENCOUNTER — Ambulatory Visit: Attending: Radiation Oncology

## 2024-07-25 DIAGNOSIS — C8101 Nodular lymphocyte predominant Hodgkin lymphoma, lymph nodes of head, face, and neck: Secondary | ICD-10-CM | POA: Insufficient documentation

## 2024-07-25 DIAGNOSIS — M25611 Stiffness of right shoulder, not elsewhere classified: Secondary | ICD-10-CM | POA: Insufficient documentation

## 2024-07-25 DIAGNOSIS — M542 Cervicalgia: Secondary | ICD-10-CM | POA: Insufficient documentation

## 2024-07-25 DIAGNOSIS — L599 Disorder of the skin and subcutaneous tissue related to radiation, unspecified: Secondary | ICD-10-CM | POA: Diagnosis not present

## 2024-07-25 NOTE — Therapy (Signed)
 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 Jeffrie- follows- [facility] as needed for palpitations   Elevated liver function tests    GERD (gastroesophageal reflux disease)    HOH (hard of hearing)    wears HAs   Hypertension    Pneumonia    Premature atrial contraction    Thrombocytopenia    Past Surgical History:  Procedure Laterality Date   bilateral rotator surgery     COLONOSCOPY WITH PROPOFOL  N/A 03/08/2016   Procedure: COLONOSCOPY WITH PROPOFOL ;  Surgeon: [name], MD;  Location: [facility];  Service: Endoscopy;  Laterality: N/A;   HERNIA REPAIR  1970   right inguinal hernia   LYMPH NODE BIOPSY Right 02/19/2024   Procedure: LYMPH NODE BIOPSY;  Surgeon: [name], MD;  Location: [facility];  Service: General;  Laterality: Right;  Excisional biopsy right posterior cervical lymph nodes   Patient Active Problem List   Diagnosis Date Noted   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   Cervical lymphadenopathy 02/18/2024   Essential hypertension 04/08/2015   Bradycardia 03/30/2015   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   Elevated LFTs 02/10/2014   Pneumonia 02/06/2014   Thrombocytopenia 02/06/2014   High anion gap metabolic acidosis 02/06/2014   Sepsis (HCC) 02/06/2014    PCP: Dorn Sauers, MD  REFERRING PROVIDER: [name], MD  REFERRING  DIAG: C81.01 (ICD-10-CM) - 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 It feels looser. One of my problems is the way I sleep and as I move around I get a crick. It has improved since   PERTINENT HISTORY:  Nodular Lymphocyte Predominant Hodgkin's Lymphoma. Presented to PCP, Dr. Dorn Sauers with right neck and shoulder pain. Physical exam findings was notable for nodular mass in the right posterior cervical region.  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 Molecular genetic testing with AFIRMA, and returned with a result of benign. 02/19/2024: Underwent excision of right posterior cervical lymph node. Pathology was consistent with nodular lymphocyte predoominant Hodgkin's lymphoma. 03/11/2024: Establish [facility] with [facility] Hematology[facility]On[facility] with Dr Norleen Kidney. 04/29/2024: completed definitive radiation therapy. Hx  of bilateral rotator cuff repair. Imaging on 03/13/23: FINDINGS: The cervical spine is visualized from the occiput to the cervicothoracic junction. Exaggerated cervical lordosis. Alignment is otherwise anatomic. Prevertebral soft tissues are within normal limits. Vertebral body and disc space heights are maintained. Multilevel uncovertebral hypertrophy and facet sclerosis. Severe right neural  foraminal narrowing at C3-4 and C4-5. There may be minimal neural foraminal narrowing in the lower cervical spine on the left. Visualized lung apices are clear.  PATIENT GOALS:   to be educated about the signs and symptoms of lymphedema and learn post op HEP.   PAIN:  Are you having pain? No just a tingle in the top of the shoulder, makes it better: heat and not turning the head, makes it worse: turning quick  PRECAUTIONS: risk of lymphedema  RED FLAGS: None   WEIGHT BEARING RESTRICTIONS: No  FALLS:  Has patient fallen in last 6 months? No Does the patient have a fear of falling that limits activity? 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  COGNITION: Overall cognitive status: Within functional limits for tasks assessed                  POSTURE:  Forward head and rounded shoulders posture  30 SEC SIT TO STAND: 13 reps in 30 sec without use of UEs which is  Average for patient's age  SHOULDER AROM:   WFL   CERVICAL AROM:   Percent limited  Flexion WFL  Extension 25% limited  Right lateral flexion 75% limited with pain  Left lateral flexion 75% limited  Right rotation 25% limited with pain  Left rotation 25% limited    (Blank rows=not tested)  LYMPHEDEMA ASSESSMENT:    Circumference in cm  4 cm superior to sternal notch around neck 43  6 cm superior to sternal notch around neck 43  8 cm superior to sternal notch around neck 44  R lateral nostril from base of nose to medial tragus   L lateral nostril from base of nose to medial tragus   R corner of mouth to where ear lobe meets face    L corner of mouth to where ear lobe meets face         (Blank rows=not tested)  Neck Disability Index score: 10 / 50 = 20.0 %  TREATMENT PERFORMED: 07/25/2024 Check cervical ROM prior to treatment Manual traction, suboccipital release STM to B upper traps,upper  rhomboids, levator, posterior and lateral cervical muscles using cocoa butter. Left SL with wave tool to R upper traps, rhomboids, levator, posterior and lateral cervical muscles using cocoa butter, then dynamic cupping to same with small blue cup Supine cervical retractions into therapist hands x 10 PROM bilateral cervical rotation x 3-4 reps with CR stretch 3 times each side, PROM bilateral SB Educated in importance of proper siting posture on neck position Rechecked cervical ROM at end with good improvement and no increased pain. Mild tingle remained at UT region  07/23/24:  Manual Therapy: STM to R upper traps, rhomboids, levator, posterior and lateral cervical muscles using cocoa butter and WAVE tool in L side lying with numerous areas of muscle  tightness noted with improvement noted by end of session PROM in bilateral rotation and bilateral lateral flexion with prolonged holds without the increased pain that pt had been having Educated pt in how to do self massage using a tennis ball on the wall to help decrease tightness in this muscles  07/17/24: Instructed pt in neck and scapular retraction exercise,brief  gentle STM to L levator/upper traps and to lateral cervical muscles - also see education section  PATIENT EDUCATION:  Education details: Neck ROM, importance of posture when sitting, standing and lying down, how his foraminal narrowing in the cerivcal region could be causing the radicular pain in his R arm and how posture and muscle tightness can exacerbate this Person educated: Patient Education method: Explanation, Demonstration, Handout Education comprehension: Patient verbalized understanding and returned  demonstration  HOME EXERCISE PROGRAM: Patient was instructed today in a home exercise program today for head and neck range of motion exercises. These included neck and shoulder retraction. Patient was encouraged to do these 2-3 times a day, holding for 5 sec each and completing for 5 reps. Pt was educated that once this becomes easier then hold the stretches for 30-60 seconds.  Educated pt to avoid stretches or movements that cause increased pain   ASSESSMENT:  CLINICAL IMPRESSION: Continued manual therapies to decrease muscular tightness and improve ROM. Good visual improvement made with ROM and pt denied pain after treatment. Pleased with his progress.   Pt will benefit from skilled therapeutic intervention to improve on the following deficits: Decreased knowledge of precautions and postural dysfunction. Other deficits: decreased knowledge of condition, decreased ROM, decreased strength, increased fascial restrictions, increased muscle spasms, postural dysfunction, and pain  PT treatment/interventions: ADL/self-care home management, pt/family education, therapeutic exercise. Other interventions [id number]- PT Re-evaluation, 97110-Therapeutic exercises, 97530- Therapeutic activity, [id number]- Neuromuscular re-education, 97535- Self Care, [id number]- Manual therapy, 97760- Orthotic Initial, [phone]- Orthotic/Prosthetic subsequent, and Patient/Family education  REHAB POTENTIAL: Good  CLINICAL DECISION MAKING: Stable/uncomplicated  EVALUATION COMPLEXITY: Low   GOALS: Goals reviewed with patient? YES  LONG TERM GOALS: (STG=LTG)   Name Target Date  Goal status  1 Patient will be able to verbalize understanding of a home exercise program for cervical range of motion, posture, and walking.   Baseline:  No knowledge 07/17/2024 Achieved at eval  2 Patient will be able to verbalize understanding of proper sitting and standing posture. Baseline:  No knowledge 07/17/2024 Achieved at eval  3 Patient will be  able to verbalize understanding of lymphedema risk and availability of treatment for this condition Baseline:  No knowledge 07/17/2024 Achieved at eval  4 Pt to be able to turn his head to look over his left and right shoulder while driving without increased pain and spasm in his neck.   08/14/24 New  5 Pt will be independent in a home exercise program for continued stretching and strengthening to improve posture and decrease neck pain. 08/14/24 NEW    6 Pt will demonstrate improved cervical lateral flexion and will only demonstrate 50% limitation instead of 75% limitation to allow improved motion/function 08/14/24 NEW    PLAN:  PT FREQUENCY/DURATION: 2x/wk for 4 wks  PLAN FOR NEXT SESSION: STM to upper traps, levator, lateral and posterior cervical muscles, how are retraction exercises, begin isometrics and stretching once there is decreased muscle tightness  Grayce JINNY Sheldon, PT 07/25/2024, 2:49 PM

## 2024-07-29 ENCOUNTER — Ambulatory Visit: Admitting: Hematology and Oncology

## 2024-07-29 ENCOUNTER — Other Ambulatory Visit

## 2024-07-30 ENCOUNTER — Ambulatory Visit: Admitting: Physical Therapy

## 2024-07-30 ENCOUNTER — Encounter: Payer: Self-pay | Admitting: Physical Therapy

## 2024-07-30 DIAGNOSIS — C8101 Nodular lymphocyte predominant Hodgkin lymphoma, lymph nodes of head, face, and neck: Secondary | ICD-10-CM | POA: Diagnosis not present

## 2024-07-30 DIAGNOSIS — M542 Cervicalgia: Secondary | ICD-10-CM | POA: Diagnosis not present

## 2024-07-30 DIAGNOSIS — L599 Disorder of the skin and subcutaneous tissue related to radiation, unspecified: Secondary | ICD-10-CM | POA: Diagnosis not present

## 2024-07-30 DIAGNOSIS — M25611 Stiffness of right shoulder, not elsewhere classified: Secondary | ICD-10-CM | POA: Diagnosis not present

## 2024-07-30 NOTE — Therapy (Signed)
 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 Jeffrie- follows- [facility] as needed for palpitations   Elevated liver function tests    GERD (gastroesophageal reflux disease)    HOH (hard of hearing)    wears HAs   Hypertension    Pneumonia    Premature atrial contraction    Thrombocytopenia    Past Surgical History:  Procedure Laterality Date   bilateral rotator surgery     COLONOSCOPY WITH PROPOFOL  N/A 03/08/2016   Procedure: COLONOSCOPY WITH PROPOFOL ;  Surgeon: [name], MD;  Location: [facility];  Service: Endoscopy;  Laterality: N/A;   HERNIA REPAIR  1970   right inguinal hernia   LYMPH NODE BIOPSY Right 02/19/2024   Procedure: LYMPH NODE BIOPSY;  Surgeon: [name], MD;  Location: [facility];  Service: General;  Laterality: Right;  Excisional biopsy right posterior cervical lymph nodes   Patient Active Problem List   Diagnosis Date Noted   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   Cervical lymphadenopathy 02/18/2024   Essential hypertension 04/08/2015   Bradycardia 03/30/2015   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   Elevated LFTs 02/10/2014   Pneumonia 02/06/2014   Thrombocytopenia 02/06/2014   High anion gap metabolic acidosis 02/06/2014   Sepsis (HCC) 02/06/2014    PCP: Dorn Sauers, MD  REFERRING PROVIDER: [name], MD  REFERRING  DIAG: C81.01 (ICD-10-CM) - 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 I worked yesterday and I am a little bit sore today.   PERTINENT HISTORY:  Nodular Lymphocyte Predominant Hodgkin's Lymphoma. Presented to PCP, Dr. Dorn Sauers with right neck and shoulder pain. Physical exam findings was notable for nodular mass in the right posterior cervical region.  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 Molecular genetic testing with AFIRMA, and returned with a result of benign. 02/19/2024: Underwent excision of right posterior cervical lymph node. Pathology was consistent with nodular lymphocyte predoominant Hodgkin's lymphoma. 03/11/2024: Establish care with [facility] Hematology/Oncology with Dr Norleen Kidney. 04/29/2024: completed definitive radiation therapy. Hx of bilateral rotator cuff repair. Imaging on 03/13/23: FINDINGS: The cervical spine is visualized from  the occiput to the cervicothoracic junction. Exaggerated cervical lordosis. Alignment is otherwise anatomic. Prevertebral soft tissues are within normal limits. Vertebral body and disc space heights are maintained. Multilevel uncovertebral hypertrophy and facet sclerosis. Severe right neural foraminal narrowing at C3-4 and C4-5. There may be minimal neural  foraminal narrowing in the lower cervical spine on the left. Visualized lung apices are clear.  PATIENT GOALS:   to be educated about the signs and symptoms of lymphedema and learn post op HEP.   PAIN:  Are you having pain? No   PRECAUTIONS: risk of lymphedema  RED FLAGS: None   WEIGHT BEARING RESTRICTIONS: No  FALLS:  Has patient fallen in last 6 months? No Does the patient have a fear of falling that limits activity? 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  COGNITION: Overall cognitive status: Within functional limits for tasks assessed                  POSTURE:  Forward head and rounded shoulders posture  30 SEC SIT TO STAND: 13 reps in 30 sec without use of UEs which is  Average for patient's age  SHOULDER AROM:   WFL   CERVICAL AROM:   Percent limited 07/30/24  Flexion WFL WFL  Extension 25% limited WFL  Right lateral flexion 75% limited with pain 25% limited - tight  Left lateral flexion 75% limited 50% limited  Right rotation 25% limited with pain 25% limited  Left rotation 25% limited 25% limited    (Blank rows=not tested)  LYMPHEDEMA ASSESSMENT:    Circumference in cm  4 cm superior to sternal notch around neck 43  6 cm superior to sternal notch around neck 43  8 cm superior to sternal notch around neck 44  R lateral nostril from base of nose to medial tragus   L lateral nostril from base of nose to medial tragus   R corner of mouth to where ear lobe meets face   L corner of mouth to where ear lobe meets face         (Blank rows=not tested)  Neck Disability Index  score: 10 / 50 = 20.0 %  TREATMENT PERFORMED: 07/30/24 Manual Therapy: Manual traction, suboccipital release STM in supine to upper traps, bilateral posterior and lateral cervical mucles PROM in to bilateral rotation and lateral flexion STM to R upper traps, rhomboids, levator, posterior and lateral cervical muscles using cocoa butter and WAVE tool in L side lying with numerous areas of muscle tightness noted with improvement noted by end of session   07/25/2024 Check cervical ROM prior to treatment Manual traction, suboccipital release STM to B upper traps,upper  rhomboids, levator, posterior and lateral cervical muscles using cocoa butter. Left SL with wave tool to R upper traps, rhomboids, levator, posterior and lateral cervical muscles using cocoa butter, then dynamic cupping to same with small blue cup Supine cervical retractions into therapist hands x 10 PROM bilateral cervical rotation x 3-4 reps with CR stretch 3 times each side, PROM bilateral SB Educated in importance of proper siting posture on neck position Rechecked cervical ROM at end with good improvement and  no increased pain. Mild tingle remained at UT region  07/23/24:  Manual Therapy: STM to R upper traps, rhomboids, levator, posterior and lateral cervical muscles using cocoa butter and WAVE tool in L side lying with numerous areas of muscle tightness noted with improvement noted by end of session PROM in bilateral rotation and bilateral lateral flexion with prolonged holds without the increased pain that pt had been having Educated pt in how to do self massage using a tennis ball on the wall to help decrease tightness in this muscles  07/17/24: Instructed pt in neck and scapular retraction exercise,brief  gentle STM to L levator/upper traps and to lateral cervical muscles - also see education section  PATIENT EDUCATION:  Education details: Neck ROM, importance of posture when sitting, standing and lying down, how his  foraminal narrowing in the cerivcal region could be causing the radicular pain in his R arm and how posture and muscle tightness can exacerbate this Person educated: Patient Education method: Explanation, Demonstration, Handout Education comprehension: Patient verbalized understanding and returned demonstration  HOME EXERCISE PROGRAM: Patient was instructed today in a home exercise program today for head and neck range of motion exercises. These included neck and shoulder retraction. Patient was encouraged to do these 2-3 times a day, holding for 5 sec each and completing for 5 reps. Pt was educated that once this becomes easier then hold the stretches for 30-60 seconds.  Educated pt to avoid stretches or movements that cause increased pain   ASSESSMENT:  CLINICAL IMPRESSION: Continued manual therapies to decrease muscular tightness and improve ROM. Less tightness noted by end of session with knots less pronounced. Still has trigger point at levator but less tight than at beginning of session.   Pt will benefit from skilled therapeutic intervention to improve on the following deficits: Decreased knowledge of precautions and postural dysfunction. Other deficits: decreased knowledge of condition, decreased ROM, decreased strength, increased fascial restrictions, increased muscle spasms, postural dysfunction, and pain  PT treatment/interventions: ADL/self-care home management, pt/family education, therapeutic exercise. Other interventions [id number]- PT Re-evaluation, 97110-Therapeutic exercises, 97530- Therapeutic activity, [id number]- Neuromuscular re-education, 97535- Self Care, [id number]- Manual therapy, 97760- Orthotic Initial, [phone]- Orthotic/Prosthetic subsequent, and Patient/Family education  REHAB POTENTIAL: Good  CLINICAL DECISION MAKING: Stable/uncomplicated  EVALUATION COMPLEXITY: Low   GOALS: Goals reviewed with patient? YES  LONG TERM GOALS: (STG=LTG)   Name Target Date  Goal status  1  Patient will be able to verbalize understanding of a home exercise program for cervical range of motion, posture, and walking.   Baseline:  No knowledge 07/17/2024 Achieved at eval  2 Patient will be able to verbalize understanding of proper sitting and standing posture. Baseline:  No knowledge 07/17/2024 Achieved at eval  3 Patient will be able to verbalize understanding of lymphedema risk and availability of treatment for this condition Baseline:  No knowledge 07/17/2024 Achieved at eval  4 Pt to be able to turn his head to look over his left and right shoulder while driving without increased pain and spasm in his neck.   08/14/24 New  5 Pt will be independent in a home exercise program for continued stretching and strengthening to improve posture and decrease neck pain. 08/14/24 NEW    6 Pt will demonstrate improved cervical lateral flexion and will only demonstrate 50% limitation instead of 75% limitation to allow improved motion/function 08/14/24 NEW    PLAN:  PT FREQUENCY/DURATION: 2x/wk for 4 wks  PLAN FOR NEXT SESSION: STM to upper traps, levator, lateral and  posterior cervical muscles, how are retraction exercises, begin isometrics and stretching once there is decreased muscle tightness  [employer], PT 07/30/2024, 8:56 AM

## 2024-07-31 ENCOUNTER — Other Ambulatory Visit

## 2024-07-31 ENCOUNTER — Ambulatory Visit: Admitting: Hematology and Oncology

## 2024-08-01 ENCOUNTER — Ambulatory Visit

## 2024-08-01 DIAGNOSIS — M542 Cervicalgia: Secondary | ICD-10-CM | POA: Diagnosis not present

## 2024-08-01 DIAGNOSIS — C8101 Nodular lymphocyte predominant Hodgkin lymphoma, lymph nodes of head, face, and neck: Secondary | ICD-10-CM | POA: Diagnosis not present

## 2024-08-01 DIAGNOSIS — L599 Disorder of the skin and subcutaneous tissue related to radiation, unspecified: Secondary | ICD-10-CM | POA: Diagnosis not present

## 2024-08-01 DIAGNOSIS — M25611 Stiffness of right shoulder, not elsewhere classified: Secondary | ICD-10-CM | POA: Diagnosis not present

## 2024-08-01 NOTE — Therapy (Signed)
 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 Jeffrie- follows- [facility] as needed for palpitations   Elevated liver function tests    GERD (gastroesophageal reflux disease)    HOH (hard of hearing)    wears HAs   Hypertension    Pneumonia    Premature atrial contraction    Thrombocytopenia    Past Surgical History:  Procedure Laterality Date   bilateral rotator surgery     COLONOSCOPY WITH PROPOFOL  N/A 03/08/2016   Procedure: COLONOSCOPY WITH PROPOFOL ;  Surgeon: [name], MD;  Location: [facility];  Service: Endoscopy;  Laterality: N/A;   HERNIA REPAIR  1970   right inguinal hernia   LYMPH NODE BIOPSY Right 02/19/2024   Procedure: LYMPH NODE BIOPSY;  Surgeon: [name], MD;  Location: [facility];  Service: General;  Laterality: Right;  Excisional biopsy right posterior cervical lymph nodes   Patient Active Problem List   Diagnosis Date Noted   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   Cervical lymphadenopathy 02/18/2024   Essential hypertension 04/08/2015   Bradycardia 03/30/2015   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   Elevated LFTs 02/10/2014   Pneumonia 02/06/2014   Thrombocytopenia 02/06/2014   High anion gap metabolic acidosis 02/06/2014   Sepsis (HCC) 02/06/2014    PCP: Dorn Sauers, MD  REFERRING PROVIDER: [name], MD  REFERRING DIAG: C81.01  (ICD-10-CM) - 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 My Rt ear has been numb since surgery but I have started getting some feeling back.   PERTINENT HISTORY:  Nodular Lymphocyte Predominant Hodgkin's Lymphoma. Presented to PCP, Dr. Dorn Sauers with right neck and shoulder pain. Physical exam findings was notable for nodular mass in the right posterior cervical region.  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 Molecular genetic testing with AFIRMA, and returned with a result of benign. 02/19/2024: Underwent excision of right posterior cervical lymph node. Pathology was consistent with nodular lymphocyte predoominant Hodgkin's lymphoma. 03/11/2024: Establish care with [facility] with Dr Norleen Kidney. 04/29/2024: completed definitive radiation therapy. Hx of bilateral rotator cuff repair. Imaging on 03/13/23:  FINDINGS: The cervical spine is visualized from the occiput to the cervicothoracic junction. Exaggerated cervical lordosis. Alignment is otherwise anatomic. Prevertebral soft tissues are within normal limits. Vertebral body and disc space heights are maintained. Multilevel uncovertebral hypertrophy and facet sclerosis. Severe right neural foraminal narrowing at C3-4 and  C4-5. There may be minimal neural foraminal narrowing in the lower cervical spine on the left. Visualized lung apices are clear.  PATIENT GOALS:   to be educated about the signs and symptoms of lymphedema and learn post op HEP.   PAIN:  Are you having pain? No   PRECAUTIONS: risk of lymphedema  RED FLAGS: None   WEIGHT BEARING RESTRICTIONS: No  FALLS:  Has patient fallen in last 6 months? No Does the patient have a fear of falling that limits activity? 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  COGNITION: Overall cognitive status: Within functional limits for tasks assessed                  POSTURE:  Forward head and rounded shoulders posture  30 SEC SIT TO STAND: 13 reps in 30 sec without use of UEs which is  Average for patient's age  SHOULDER AROM:   WFL   CERVICAL AROM:   Percent limited 07/30/24  Flexion WFL WFL  Extension 25% limited WFL  Right lateral flexion 75% limited with pain 25% limited - tight  Left lateral flexion 75% limited 50% limited  Right rotation 25% limited with pain 25% limited  Left rotation 25% limited 25% limited    (Blank rows=not tested)  LYMPHEDEMA ASSESSMENT:    Circumference in cm  4 cm superior to sternal notch around neck 43  6 cm superior to sternal notch around neck 43  8 cm superior to sternal notch around neck 44  R lateral nostril from base of nose to medial tragus   L lateral nostril from base of nose to medial tragus   R corner of mouth to where ear lobe meets face   L corner of mouth to where ear lobe meets face         (Blank rows=not  tested)  Neck Disability Index score: 10 / 50 = 20.0 % M  TREATMENT PERFORMED: 08/01/24: Therapeutic Activities  Supine scapular series with red theraband and cervical retraction during (horz abd, bil er, narrow and wide grip, and bil D2) x 10 each returning therapist demo for postural strength and stability; added to HEP. Pt had increased pain with limited available mobility with Rt er so instructed him to only move within tolerance, he verbalized good understanding.  Manual Therapy Manual traction, suboccipital release STM in supine to bil upper traps, bilateral posterior and lateral cervical muscles including SCM, focusing on Rt>Lt insertion where increased palpable tightness, with cocoa butter PROM in to bilateral rotation and lateral flexion with and without overpressure to clavicle for increased stretch  07/30/24 Manual Therapy: Manual traction, suboccipital release STM in supine to upper traps, bilateral posterior and lateral cervical mucles PROM in to bilateral rotation and lateral flexion STM to R upper traps, rhomboids, levator, posterior and lateral cervical muscles using cocoa butter and  WAVE tool in L side lying with numerous areas of muscle tightness noted with improvement noted by end of session   07/25/2024 Check cervical ROM prior to treatment Manual traction, suboccipital release STM to B upper traps,upper  rhomboids, levator, posterior and lateral cervical muscles using cocoa butter. Left SL with wave tool to R upper traps, rhomboids, levator, posterior and lateral cervical muscles using cocoa butter, then dynamic cupping to same with small blue cup Supine cervical retractions into therapist hands x 10 PROM bilateral cervical rotation x 3-4 reps with CR stretch 3 times each side, PROM bilateral SB Educated in importance of proper siting posture on neck position Rechecked cervical ROM at end with good improvement and no increased pain. Mild tingle remained at UT  region    PATIENT EDUCATION:  Education details: Supine scapular series with red theraband Person educated: Patient Education method: Explanation, Demonstration, VC's for technique, Handout Education comprehension: Patient verbalized understanding and returned demonstration, will benefit from further review  HOME EXERCISE PROGRAM: Patient was instructed today in a home exercise program today for head and neck range of motion exercises. These included neck and shoulder retraction. Patient was encouraged to do these 2-3 times a day, holding for 5 sec each and completing for 5 reps. Pt was educated that once this becomes easier then hold the stretches for 30-60 seconds.  Educated pt to avoid stretches or movements that cause increased pain 08/01/24 - Supine scapular series    ASSESSMENT:  CLINICAL IMPRESSION: Progressed pt to include postural strength/stability with supine scapular series and with engaging cervical retraction during exercises. Pt report feeling benefit of this. He was limited in mobility and by mild pain with er so encouraged him to limit ROM here to within pain tolerance due to previous shoulder complications. Continued with manual therapy wprking to decrease cervical tightness with trigger point release, his end P/ROM was much improved by end of session.   Pt will benefit from skilled therapeutic intervention to improve on the following deficits: Decreased knowledge of precautions and postural dysfunction. Other deficits: decreased knowledge of condition, decreased ROM, decreased strength, increased fascial restrictions, increased muscle spasms, postural dysfunction, and pain  PT treatment/interventions: ADL/self-care home management, pt/family education, therapeutic exercise. Other interventions [id number]- PT Re-evaluation, 97110-Therapeutic exercises, 97530- Therapeutic activity, [id number]- Neuromuscular re-education, 97535- Self Care, [id number]- Manual therapy, 97760- Orthotic Initial,  [phone]- Orthotic/Prosthetic subsequent, and Patient/Family education  REHAB POTENTIAL: Good  CLINICAL DECISION MAKING: Stable/uncomplicated  EVALUATION COMPLEXITY: Low   GOALS: Goals reviewed with patient? YES  LONG TERM GOALS: (STG=LTG)   Name Target Date  Goal status  1 Patient will be able to verbalize understanding of a home exercise program for cervical range of motion, posture, and walking.   Baseline:  No knowledge 07/17/2024 Achieved at eval  2 Patient will be able to verbalize understanding of proper sitting and standing posture. Baseline:  No knowledge 07/17/2024 Achieved at eval  3 Patient will be able to verbalize understanding of lymphedema risk and availability of treatment for this condition Baseline:  No knowledge 07/17/2024 Achieved at eval  4 Pt to be able to turn his head to look over his left and right shoulder while driving without increased pain and spasm in his neck.   08/14/24 New  5 Pt will be independent in a home exercise program for continued stretching and strengthening to improve posture and decrease neck pain. 08/14/24 NEW    6 Pt will demonstrate improved cervical lateral flexion and will only demonstrate 50% limitation  instead of 75% limitation to allow improved motion/function 08/14/24 NEW    PLAN:  PT FREQUENCY/DURATION: 2x/wk for 4 wks  PLAN FOR NEXT SESSION: Review new HEP; STM to upper traps, levator, lateral and posterior cervical muscles, how are retraction exercises, begin isometrics and stretching once there is decreased muscle tightness  Aden Berwyn Caldron, PTA 08/01/2024, 8:59 AM     Over Head Pull: Narrow and Wide Grip   Cancer Rehab [phone]   On back, knees bent, feet flat, band across thighs, elbows straight but relaxed. Pull hands apart (start). Keeping elbows straight, bring arms up and over head, hands toward floor. Keep pull steady on band. Hold momentarily. Return slowly, keeping pull steady, back to start. Then do same  with a wider grip on the band (past shoulder width) Repeat _5-10__ times. Band color __red____   Side Pull: Double Arm   On back, knees bent, feet flat. Arms perpendicular to body, shoulder level, elbows straight but relaxed. Pull arms out to sides, elbows straight. Resistance band comes across collarbones, hands toward floor. Hold momentarily. Slowly return to starting position. Repeat _5-10__ times. Band color _red____   Sword   On back, knees bent, feet flat, left hand on left hip, right hand above left. Pull right arm DIAGONALLY (hip to shoulder) across chest. Bring right arm along head toward floor. Hold momentarily. Slowly return to starting position. Repeat _5-10__ times. Do with left arm. Band color _red_____   Shoulder Rotation: Double Arm   On back, knees bent, feet flat, elbows tucked at sides, bent 90, hands palms up. Pull hands apart and down toward floor, keeping elbows near sides. Hold momentarily. Slowly return to starting position. Repeat _5-10__ times. Band color __red____

## 2024-08-01 NOTE — Patient Instructions (Addendum)
 Geoffrey Duran

## 2024-08-06 ENCOUNTER — Encounter: Payer: Self-pay | Admitting: Physical Therapy

## 2024-08-06 ENCOUNTER — Ambulatory Visit: Admitting: Physical Therapy

## 2024-08-06 DIAGNOSIS — M25611 Stiffness of right shoulder, not elsewhere classified: Secondary | ICD-10-CM | POA: Diagnosis not present

## 2024-08-06 DIAGNOSIS — L599 Disorder of the skin and subcutaneous tissue related to radiation, unspecified: Secondary | ICD-10-CM | POA: Diagnosis not present

## 2024-08-06 DIAGNOSIS — M542 Cervicalgia: Secondary | ICD-10-CM

## 2024-08-06 DIAGNOSIS — C8101 Nodular lymphocyte predominant Hodgkin lymphoma, lymph nodes of head, face, and neck: Secondary | ICD-10-CM

## 2024-08-06 NOTE — Therapy (Signed)
 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 Jeffrie- follows- [facility] as needed for palpitations   Elevated liver function tests    GERD (gastroesophageal reflux disease)    HOH (hard of hearing)    wears HAs   Hypertension    Pneumonia    Premature atrial contraction    Thrombocytopenia    Past Surgical History:  Procedure Laterality Date   bilateral rotator surgery     COLONOSCOPY WITH PROPOFOL  N/A 03/08/2016   Procedure: COLONOSCOPY WITH PROPOFOL ;  Surgeon: [name], MD;  Location: [facility];  Service: Endoscopy;  Laterality: N/A;   HERNIA REPAIR  1970   right inguinal hernia   LYMPH NODE BIOPSY Right 02/19/2024   Procedure: LYMPH NODE BIOPSY;  Surgeon: [name], MD;  Location: [facility];  Service: General;  Laterality: Right;  Excisional biopsy right posterior cervical lymph nodes   Patient Active Problem List   Diagnosis Date Noted   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   Cervical lymphadenopathy 02/18/2024   Essential hypertension 04/08/2015   Bradycardia 03/30/2015   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   Elevated LFTs 02/10/2014   Pneumonia 02/06/2014   Thrombocytopenia 02/06/2014   High anion gap metabolic acidosis 02/06/2014   Sepsis (HCC) 02/06/2014    PCP: Dorn Sauers, MD  REFERRING PROVIDER: [name], MD  REFERRING DIAG: C81.01  (ICD-10-CM) - 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 I have been doing them once daily.   PERTINENT HISTORY:  Nodular Lymphocyte Predominant Hodgkin's Lymphoma. Presented to PCP, Dr. Dorn Sauers with right neck and shoulder pain. Physical exam findings was notable for nodular mass in the right posterior cervical region.  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 Molecular genetic testing with AFIRMA, and returned with a result of benign. 02/19/2024: Underwent excision of right posterior cervical lymph node. Pathology was consistent with nodular lymphocyte predoominant Hodgkin's lymphoma. 03/11/2024: Establish care with [facility] Hematology/Oncology with Dr Norleen Kidney. 04/29/2024: completed definitive radiation therapy. Hx of bilateral rotator cuff repair. Imaging on 03/13/23: FINDINGS: The cervical spine is visualized from the occiput to the  cervicothoracic junction. Exaggerated cervical lordosis. Alignment is otherwise anatomic. Prevertebral soft tissues are within normal limits. Vertebral body and disc space heights are maintained. Multilevel uncovertebral hypertrophy and facet sclerosis. Severe right neural foraminal narrowing at C3-4 and C4-5. There may be minimal neural foraminal narrowing in  the lower cervical spine on the left. Visualized lung apices are clear.  PATIENT GOALS:   to be educated about the signs and symptoms of lymphedema and learn post op HEP.   PAIN:  Are you having pain? No   PRECAUTIONS: risk of lymphedema  RED FLAGS: None   WEIGHT BEARING RESTRICTIONS: No  FALLS:  Has patient fallen in last 6 months? No Does the patient have a fear of falling that limits activity? 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  COGNITION: Overall cognitive status: Within functional limits for tasks assessed                  POSTURE:  Forward head and rounded shoulders posture  30 SEC SIT TO STAND: 13 reps in 30 sec without use of UEs which is  Average for patient's age  SHOULDER AROM:   WFL   CERVICAL AROM:   Percent limited 07/30/24 08/06/24  Flexion WFL WFL WFL  Extension 25% limited WFL WFL  Right lateral flexion 75% limited with pain 25% limited - tight 50% limited  Left lateral flexion 75% limited 50% limited 50% limited  Right rotation 25% limited with pain 25% limited 25% limited  Left rotation 25% limited 25% limited 25% limited    (Blank rows=not tested)  LYMPHEDEMA ASSESSMENT:    Circumference in cm  4 cm superior to sternal notch around neck 43  6 cm superior to sternal notch around neck 43  8 cm superior to sternal notch around neck 44  R lateral nostril from base of nose to medial tragus   L lateral nostril from base of nose to medial tragus   R corner of mouth to where ear lobe meets face   L corner of mouth to where ear lobe meets face         (Blank  rows=not tested)  Neck Disability Index score: 10 / 50 = 20.0 % M  TREATMENT PERFORMED: 08/06/24: Therapeutic Activities  Seated isometric cervical flexion, extension, bilateral sidebneding and bilateral rotation all x 5 reps with 5 sec holds with pt returning therapist demo Supine chin tuck with towel under head with 5 sec holds x 10  Standing wall angel with stick raising overhead with good posture x 10 Doorway stretch x 5 reps with 15 sec holds Standing neck retraction against wall x 5 with 5 sec holds with pt returning therapist demo Supine scapular series with red theraband and cervical retraction during (horz abd, bil er, narrow and wide grip, and bil D2) x 10 each returning therapist demo for postural strength and stability; Educated pt to keep R shoulder relaxed and retracted to avoid anterior shoulder discomfort Manual Therapy Manual traction, suboccipital release STM in supine to bil upper traps, bilateral posterior and lateral cervical muscles including SCM PROM in to bilateral rotation and lateral flexion   08/01/24:  Therapeutic Activities  Supine scapular series with red theraband and cervical retraction during (horz abd, bil er, narrow and wide grip, and bil D2) x 10 each returning therapist demo for postural strength and stability; added to HEP. Pt had increased pain with limited available mobility with Rt er so instructed him to only move within tolerance, he verbalized good understanding.  Manual Therapy Manual traction, suboccipital release STM in supine to bil upper traps, bilateral posterior and lateral cervical muscles including SCM, focusing on Rt>Lt insertion where increased palpable tightness, with cocoa butter PROM in to bilateral rotation and lateral flexion with and without overpressure to clavicle for increased stretch  07/30/24 Manual Therapy: Manual traction, suboccipital release STM in supine to upper traps, bilateral posterior and lateral cervical  mucles PROM in to bilateral rotation and lateral flexion STM to R upper traps, rhomboids, levator, posterior and lateral cervical muscles using cocoa butter and WAVE tool in L side lying with numerous areas of muscle tightness noted with improvement noted by end of session   07/25/2024 Check cervical ROM prior to treatment Manual traction, suboccipital release STM to B upper traps,upper  rhomboids, levator, posterior and lateral cervical muscles using cocoa butter. Left SL with wave tool to R upper traps, rhomboids, levator, posterior and lateral cervical muscles using cocoa butter, then dynamic cupping to same with small blue cup Supine cervical retractions into therapist hands x 10 PROM bilateral cervical rotation x 3-4 reps with CR stretch 3 times each side, PROM bilateral SB Educated in importance of proper siting posture on neck position Rechecked cervical ROM at end with good improvement and no increased pain. Mild tingle remained at UT region    PATIENT EDUCATION:  Education details: Supine scapular series with red theraband Person educated: Patient Education method: Explanation, Demonstration, VC's for technique, Handout Education comprehension: Patient verbalized understanding and returned demonstration, will benefit from further review  HOME EXERCISE PROGRAM: Patient was instructed today in a home exercise program today for head and neck range of motion exercises. These included neck and shoulder retraction. Patient was encouraged to do these 2-3 times a day, holding for 5 sec each and completing for 5 reps. Pt was educated that once this becomes easier then hold the stretches for 30-60 seconds.  Educated pt to avoid stretches or movements that cause increased pain 08/01/24 - Supine scapular series   08/06/24: Access Code: [email or web address] URL: [email or web address] Date: 08/06/2024 Prepared by: Florina Lanis Carbon  Exercises - Cervical Retraction at Wall  - 1 x  daily - 7 x weekly - 1 sets - 5-10 reps - 5 sec hold - Standing Isometric Cervical Sidebending with Manual Resistance  - 1 x daily - 7 x weekly - 1 sets - 5-10 reps - 5 sec hold - Standing Isometric Cervical Flexion with Manual Resistance  - 1 x daily - 7 x weekly - 1 sets - 5-10 reps - 5 sec hold - Seated Isometric Cervical Extension  - 1 x daily - 7 x weekly - 1 sets - 5-10 reps - 5 sec hold - Seated Isometric Cervical Rotation  - 1 x daily - 7 x weekly - 1 sets - 5-10 reps - 5 sec hold - Wall Angels  - 1 x daily - 7 x weekly - 1 sets - 5-10 reps - 5 sec hold - Supine Cervical Retraction with Towel  - 1 x daily - 7 x weekly - 1 sets - 10 reps - 5 sec hold - Doorway Pec Stretch at  90 Degrees Abduction  - 1 x daily - 7 x weekly - 1 sets - 3 reps - 15-60 sec hold  ASSESSMENT:  CLINICAL IMPRESSION: Continue today with more postural strength/stability and cervical strengthening exercises. Updated pt's POC to include new exercises.  Pt reports he is able to look over his shoulder while driving with less pain and discomfort. Continued to educate pt on importance of posture with managing discomfort and allowing improved ROM. Continued with manual therapy wprking to decrease cervical tightness with trigger point release, his end P/ROM was much improved by end of session.   Pt will benefit from skilled therapeutic intervention to improve on the following deficits: Decreased knowledge of precautions and postural dysfunction. Other deficits: decreased knowledge of condition, decreased ROM, decreased strength, increased fascial restrictions, increased muscle spasms, postural dysfunction, and pain  PT treatment/interventions: ADL/self-care home management, pt/family education, therapeutic exercise. Other interventions [id number]- PT Re-evaluation, 97110-Therapeutic exercises, 97530- Therapeutic activity, [id number]- Neuromuscular re-education, 97535- Self Care, [id number]- Manual therapy, 97760- Orthotic Initial, [id number]-  Orthotic/Prosthetic subsequent, and Patient/Family education  REHAB POTENTIAL: Good  CLINICAL DECISION MAKING: Stable/uncomplicated  EVALUATION COMPLEXITY: Low   GOALS: Goals reviewed with patient? YES  LONG TERM GOALS: (STG=LTG)   Name Target Date  Goal status  1 Patient will be able to verbalize understanding of a home exercise program for cervical range of motion, posture, and walking.   Baseline:  No knowledge 07/17/2024 Achieved at eval  2 Patient will be able to verbalize understanding of proper sitting and standing posture. Baseline:  No knowledge 07/17/2024 Achieved at eval  3 Patient will be able to verbalize understanding of lymphedema risk and availability of treatment for this condition Baseline:  No knowledge 07/17/2024 Achieved at eval  4 Pt to be able to turn his head to look over his left and right shoulder while driving without increased pain and spasm in his neck.   08/14/24 New  5 Pt will be independent in a home exercise program for continued stretching and strengthening to improve posture and decrease neck pain. 08/14/24 NEW    6 Pt will demonstrate improved cervical lateral flexion and will only demonstrate 50% limitation instead of 75% limitation to allow improved motion/function 08/14/24 NEW    PLAN:  PT FREQUENCY/DURATION: 2x/wk for 4 wks  PLAN FOR NEXT SESSION: Review new HEP; STM to upper traps, levator, lateral and posterior cervical muscles, how are retraction exercises, begin isometrics and stretching once there is decreased muscle tightness  [employer], PT 08/06/2024, 9:04 AM     Over Head Pull: Narrow and Wide Grip   Cancer Rehab [phone]   On back, knees bent, feet flat, band across thighs, elbows straight but relaxed. Pull hands apart (start). Keeping elbows straight, bring arms up and over head, hands toward floor. Keep pull steady on band. Hold momentarily. Return slowly, keeping pull steady, back to start. Then do same with a  wider grip on the band (past shoulder width) Repeat _5-10__ times. Band color __red____   Side Pull: Double Arm   On back, knees bent, feet flat. Arms perpendicular to body, shoulder level, elbows straight but relaxed. Pull arms out to sides, elbows straight. Resistance band comes across collarbones, hands toward floor. Hold momentarily. Slowly return to starting position. Repeat _5-10__ times. Band color _red____   Sword   On back, knees bent, feet flat, left hand on left hip, right hand above left. Pull right arm DIAGONALLY (hip to shoulder) across chest. Bring right arm along head  toward floor. Hold momentarily. Slowly return to starting position. Repeat _5-10__ times. Do with left arm. Band color _red_____   Shoulder Rotation: Double Arm   On back, knees bent, feet flat, elbows tucked at sides, bent 90, hands palms up. Pull hands apart and down toward floor, keeping elbows near sides. Hold momentarily. Slowly return to starting position. Repeat _5-10__ times. Band color __red____

## 2024-08-08 ENCOUNTER — Encounter: Payer: Self-pay | Admitting: Physical Therapy

## 2024-08-08 ENCOUNTER — Ambulatory Visit: Admitting: Physical Therapy

## 2024-08-08 DIAGNOSIS — M542 Cervicalgia: Secondary | ICD-10-CM

## 2024-08-08 DIAGNOSIS — M25611 Stiffness of right shoulder, not elsewhere classified: Secondary | ICD-10-CM | POA: Diagnosis not present

## 2024-08-08 DIAGNOSIS — C8101 Nodular lymphocyte predominant Hodgkin lymphoma, lymph nodes of head, face, and neck: Secondary | ICD-10-CM | POA: Diagnosis not present

## 2024-08-08 DIAGNOSIS — L599 Disorder of the skin and subcutaneous tissue related to radiation, unspecified: Secondary | ICD-10-CM

## 2024-08-08 NOTE — Therapy (Signed)
 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 Jeffrie- follows- [facility] as needed for palpitations   Elevated liver function tests    GERD (gastroesophageal reflux disease)    HOH (hard of hearing)    wears HAs   Hypertension    Pneumonia    Premature atrial contraction    Thrombocytopenia    Past Surgical History:  Procedure Laterality Date   bilateral rotator surgery     COLONOSCOPY WITH PROPOFOL  N/A 03/08/2016   Procedure: COLONOSCOPY WITH PROPOFOL ;  Surgeon: [name], MD;  Location: [facility];  Service: Endoscopy;  Laterality: N/A;   HERNIA REPAIR  1970   right inguinal hernia   LYMPH NODE BIOPSY Right 02/19/2024   Procedure: LYMPH NODE BIOPSY;  Surgeon: [name], MD;  Location: [facility];  Service: General;  Laterality: Right;  Excisional biopsy right posterior cervical lymph nodes   Patient Active Problem List   Diagnosis Date Noted   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   Cervical lymphadenopathy 02/18/2024   Essential hypertension 04/08/2015   Bradycardia 03/30/2015   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   Elevated LFTs 02/10/2014   Pneumonia 02/06/2014   Thrombocytopenia 02/06/2014   High anion gap metabolic acidosis 02/06/2014   Sepsis (HCC) 02/06/2014    PCP: Dorn Sauers, MD  REFERRING PROVIDER: [name], MD  REFERRING DIAG:  C81.01 (ICD-10-CM) - 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   PERTINENT HISTORY:  Nodular Lymphocyte Predominant Hodgkin's Lymphoma. Presented to PCP, Dr. Dorn Sauers with right neck and shoulder pain. Physical exam findings was notable for nodular mass in the right posterior cervical region.  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 Molecular genetic testing with [facility]IR[facility], and returned with a result of benign. 02/19/2024: Underwent excision of right posterior cervical lymph node. Pathology was consistent with nodular lymphocyte predoominant Hodgkin's lymphoma. 03/11/2024: Establish care with [facility] with Dr Norleen Kidney. 04/29/2024: completed definitive radiation therapy. Hx of bilateral rotator cuff repair. Imaging on  03/13/23: FINDINGS: The cervical spine is visualized from the occiput to the cervicothoracic junction. Exaggerated cervical lordosis. Alignment is otherwise anatomic. Prevertebral soft tissues are within normal limits. Vertebral body and disc space heights are maintained. Multilevel uncovertebral hypertrophy and facet sclerosis. Severe right neural foraminal narrowing at C3-4  and C4-5. There may be minimal neural foraminal narrowing in the lower cervical spine on the left. Visualized lung apices are clear.  PATIENT GOALS:   to be educated about the signs and symptoms of lymphedema and learn post op HEP.   PAIN:  Are you having pain? No   PRECAUTIONS: risk of lymphedema  RED FLAGS: None   WEIGHT BEARING RESTRICTIONS: No  FALLS:  Has patient fallen in last 6 months? No Does the patient have a fear of falling that limits activity? 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  COGNITION: Overall cognitive status: Within functional limits for tasks assessed                  POSTURE:  Forward head and rounded shoulders posture  30 SEC SIT TO STAND: 13 reps in 30 sec without use of UEs which is  Average for patient's age  SHOULDER AROM:   WFL   CERVICAL AROM:   Percent limited 07/30/24 08/06/24  Flexion WFL WFL WFL  Extension 25% limited WFL WFL  Right lateral flexion 75% limited with pain 25% limited - tight 50% limited  Left lateral flexion 75% limited 50% limited 50% limited  Right rotation 25% limited with pain 25% limited 25% limited  Left rotation 25% limited 25% limited 25% limited    (Blank rows=not tested)  LYMPHEDEMA ASSESSMENT:    Circumference in cm  4 cm superior to sternal notch around neck 43  6 cm superior to sternal notch around neck 43  8 cm superior to sternal notch around neck 44  R lateral nostril from base of nose to medial tragus   L lateral nostril from base of nose to medial tragus   R corner of mouth to where ear lobe meets face   L  corner of mouth to where ear lobe meets face         (Blank rows=not tested)  Neck Disability Index score: 10 / 50 = 20.0 %  TREATMENT PERFORMED:  08/08/24: Therapeutic Activities  Standing with back against 1/2 foam roller and purple ball behind head while holding chin tuck: scapular strengthening exercises with red band x 10 reps each as follows: narrow and wide grip flexion, ER (on L only), horizontal abduction and diagonals x 10 reps each with pt returning therapist demo Chin tuck with purple ball against wall: 3 way shoulder with no weight x 5 reps each Standing wall angel with purple ball behind head for chin tuck with stick raising overhead with good posture x 10 Dual Cable Machine: scapular retraction x 10 reps using 7 lbs with pt returning therapist demo and cues to avoid scapular hiking, lat pulls with 7lbs x 15 reps with v/c to avoid scapular hiking Manual Therapy STM to R upper traps, rhomboids, levator, posterior and lateral cervical muscles using cocoa butter and  WAVE tool in L side lying with numerous areas of muscle tightness noted with improvement noted by end of session  08/06/24: Therapeutic Activities  Seated isometric cervical flexion, extension, bilateral sidebneding and bilateral rotation all x 5 reps with 5 sec holds with pt returning therapist demo Supine chin tuck with towel under head with 5 sec holds x 10  Standing wall angel with stick raising overhead with good posture x 10 Doorway stretch x 5 reps with 15 sec holds Standing neck retraction against wall x 5 with 5 sec holds with pt returning therapist demo Supine scapular series with red theraband and cervical retraction during (horz abd, bil er, narrow and wide grip, and bil D2) x 10 each returning therapist demo for postural strength and stability; Educated pt to keep R shoulder relaxed and retracted to avoid anterior shoulder discomfort Manual Therapy Manual traction, suboccipital release STM in supine to  bil upper traps, bilateral posterior and lateral cervical muscles including SCM PROM in to bilateral rotation and lateral flexion   08/01/24: Therapeutic Activities  Supine scapular series with red theraband and cervical retraction during (horz abd, bil er, narrow and wide grip, and bil D2) x 10 each returning therapist demo for postural strength and stability; added to HEP. Pt had increased pain with limited available mobility with Rt er so instructed him to only move within tolerance, he verbalized good understanding.  Manual Therapy Manual traction, suboccipital release STM in supine to bil upper traps, bilateral posterior and lateral cervical muscles including SCM, focusing on Rt>Lt insertion where increased palpable tightness, with cocoa butter PROM in to bilateral rotation and lateral flexion with and without overpressure to clavicle for increased stretch  07/30/24 Manual Therapy: Manual traction, suboccipital release STM in supine to upper traps, bilateral posterior and lateral cervical mucles PROM in to bilateral rotation and lateral flexion STM to R upper traps, rhomboids, levator, posterior and lateral cervical muscles using cocoa butter and WAVE tool in L side lying with numerous areas of muscle tightness noted with improvement noted by end of session   07/25/2024 Check cervical ROM prior to treatment Manual traction, suboccipital release STM to B upper traps,upper  rhomboids, levator, posterior and lateral cervical muscles using cocoa butter. Left SL with wave tool to R upper traps, rhomboids, levator, posterior and lateral cervical muscles using cocoa butter, then dynamic cupping to same with small blue cup Supine cervical retractions into therapist hands x 10 PROM bilateral cervical rotation x 3-4 reps with CR stretch 3 times each side, PROM bilateral SB Educated in importance of proper siting posture on neck position Rechecked cervical ROM at end with good improvement and no  increased pain. Mild tingle remained at UT region    PATIENT EDUCATION:  Education details: Supine scapular series with red theraband Person educated: Patient Education method: Explanation, Demonstration, VC's for technique, Handout Education comprehension: Patient verbalized understanding and returned demonstration, will benefit from further review  HOME EXERCISE PROGRAM: Patient was instructed today in a home exercise program today for head and neck range of motion exercises. These included neck and shoulder retraction. Patient was encouraged to do these 2-3 times a day, holding for 5 sec each and completing for 5 reps. Pt was educated that once this becomes easier then hold the stretches for 30-60 seconds.  Educated pt to avoid stretches or movements that cause increased pain 08/01/24 - Supine scapular series   08/06/24: Access Code: [email or web address] URL: [email or web address] Date: 08/06/2024 Prepared by: Florina Lanis Carbon  Exercises -  Cervical Retraction at Wall  - 1 x daily - 7 x weekly - 1 sets - 5-10 reps - 5 sec hold - Standing Isometric Cervical Sidebending with Manual Resistance  - 1 x daily - 7 x weekly - 1 sets - 5-10 reps - 5 sec hold - Standing Isometric Cervical Flexion with Manual Resistance  - 1 x daily - 7 x weekly - 1 sets - 5-10 reps - 5 sec hold - Seated Isometric Cervical Extension  - 1 x daily - 7 x weekly - 1 sets - 5-10 reps - 5 sec hold - Seated Isometric Cervical Rotation  - 1 x daily - 7 x weekly - 1 sets - 5-10 reps - 5 sec hold - Wall Angels  - 1 x daily - 7 x weekly - 1 sets - 5-10 reps - 5 sec hold - Supine Cervical Retraction with Towel  - 1 x daily - 7 x weekly - 1 sets - 10 reps - 5 sec hold - Doorway Pec Stretch at 90 Degrees Abduction  - 1 x daily - 7 x weekly - 1 sets - 3 reps - 15-60 sec hold  ASSESSMENT:  CLINICAL IMPRESSION: Continued to progress postural exercises today. Moved from supine to standing for increased challenge and  placed purple ball behind head to encourage active chin tuck with UE motion. Pt reports improving motion and less pain.   Pt will benefit from skilled therapeutic intervention to improve on the following deficits: Decreased knowledge of precautions and postural dysfunction. Other deficits: decreased knowledge of condition, decreased ROM, decreased strength, increased fascial restrictions, increased muscle spasms, postural dysfunction, and pain  PT treatment/interventions: ADL/self-care home management, pt/family education, therapeutic exercise. Other interventions [id number]- PT Re-evaluation, 97110-Therapeutic exercises, 97530- Therapeutic activity, [id number]- Neuromuscular re-education, 97535- Self Care, [id number]- Manual therapy, 97760- Orthotic Initial, [phone]- Orthotic/Prosthetic subsequent, and Patient/Family education  REHAB POTENTIAL: Good  CLINICAL DECISION MAKING: Stable/uncomplicated  EVALUATION COMPLEXITY: Low   GOALS: Goals reviewed with patient? YES  LONG TERM GOALS: (STG=LTG)   Name Target Date  Goal status  1 Patient will be able to verbalize understanding of a home exercise program for cervical range of motion, posture, and walking.   Baseline:  No knowledge 07/17/2024 Achieved at eval  2 Patient will be able to verbalize understanding of proper sitting and standing posture. Baseline:  No knowledge 07/17/2024 Achieved at eval  3 Patient will be able to verbalize understanding of lymphedema risk and availability of treatment for this condition Baseline:  No knowledge 07/17/2024 Achieved at eval  4 Pt to be able to turn his head to look over his left and right shoulder while driving without increased pain and spasm in his neck.   08/14/24 New  5 Pt will be independent in a home exercise program for continued stretching and strengthening to improve posture and decrease neck pain. 08/14/24 NEW    6 Pt will demonstrate improved cervical lateral flexion and will only demonstrate 50%  limitation instead of 75% limitation to allow improved motion/function 08/14/24 NEW    PLAN:  PT FREQUENCY/DURATION: 2x/wk for 4 wks  PLAN FOR NEXT SESSION: Review new HEP; STM to upper traps, levator, lateral and posterior cervical muscles, how are retraction exercises, begin isometrics and stretching once there is decreased muscle tightness  [employer], PT 08/08/2024, 3:00 PM     Over Head Pull: Narrow and Wide Grip   Cancer Rehab [phone]   On back, knees bent, feet flat, band across thighs, elbows  straight but relaxed. Pull hands apart (start). Keeping elbows straight, bring arms up and over head, hands toward floor. Keep pull steady on band. Hold momentarily. Return slowly, keeping pull steady, back to start. Then do same with a wider grip on the band (past shoulder width) Repeat _5-10__ times. Band color __red____   Side Pull: Double Arm   On back, knees bent, feet flat. Arms perpendicular to body, shoulder level, elbows straight but relaxed. Pull arms out to sides, elbows straight. Resistance band comes across collarbones, hands toward floor. Hold momentarily. Slowly return to starting position. Repeat _5-10__ times. Band color _red____   Sword   On back, knees bent, feet flat, left hand on left hip, right hand above left. Pull right arm DIAGONALLY (hip to shoulder) across chest. Bring right arm along head toward floor. Hold momentarily. Slowly return to starting position. Repeat _5-10__ times. Do with left arm. Band color _red_____   Shoulder Rotation: Double Arm   On back, knees bent, feet flat, elbows tucked at sides, bent 90, hands palms up. Pull hands apart and down toward floor, keeping elbows near sides. Hold momentarily. Slowly return to starting position. Repeat _5-10__ times. Band color __red____

## 2024-08-13 ENCOUNTER — Ambulatory Visit

## 2024-08-13 DIAGNOSIS — L599 Disorder of the skin and subcutaneous tissue related to radiation, unspecified: Secondary | ICD-10-CM

## 2024-08-13 DIAGNOSIS — C8101 Nodular lymphocyte predominant Hodgkin lymphoma, lymph nodes of head, face, and neck: Secondary | ICD-10-CM | POA: Diagnosis not present

## 2024-08-13 DIAGNOSIS — M542 Cervicalgia: Secondary | ICD-10-CM | POA: Diagnosis not present

## 2024-08-13 DIAGNOSIS — M25611 Stiffness of right shoulder, not elsewhere classified: Secondary | ICD-10-CM | POA: Diagnosis not present

## 2024-08-13 NOTE — Therapy (Signed)
 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 Jeffrie- follows- [facility] as needed for palpitations   Elevated liver function tests    GERD (gastroesophageal reflux disease)    HOH (hard of hearing)    wears HAs   Hypertension    Pneumonia    Premature atrial contraction    Thrombocytopenia    Past Surgical History:  Procedure Laterality Date   bilateral rotator surgery     COLONOSCOPY WITH PROPOFOL  N/A 03/08/2016   Procedure: COLONOSCOPY WITH PROPOFOL ;  Surgeon: [name], MD;  Location: [facility];  Service: Endoscopy;  Laterality: N/A;   HERNIA REPAIR  1970   right inguinal hernia   LYMPH NODE BIOPSY Right 02/19/2024   Procedure: LYMPH NODE BIOPSY;  Surgeon: [name], MD;  Location: [facility];  Service: General;  Laterality: Right;  Excisional biopsy right posterior cervical lymph nodes   Patient Active Problem List   Diagnosis Date Noted   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   Cervical lymphadenopathy 02/18/2024   Essential hypertension 04/08/2015   Bradycardia 03/30/2015   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   Elevated LFTs 02/10/2014   Pneumonia 02/06/2014   Thrombocytopenia 02/06/2014   High anion gap metabolic acidosis 02/06/2014   Sepsis (HCC) 02/06/2014    PCP: Dorn Sauers, MD  REFERRING PROVIDER: [name], MD  REFERRING DIAG: C81.01 (ICD-10-CM) - 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 The pain goes from my lower neck and then deep . If I keep moving I'm fine. If I sit 10 mins its worse, or if I use my right shoulder its worse.   Pain is 75% better overall, but in a few minutes after sitting it starts hurting and then I have to move my arm around and its better. I can't tell if the manual things work, but I know I am overall better. This has really helped  PERTINENT HISTORY:  Nodular Lymphocyte Predominant Hodgkin's Lymphoma. Presented to PCP, Dr. Dorn Sauers with right neck and shoulder pain. Physical exam findings was notable for nodular mass in the right posterior cervical region.  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 Molecular genetic testing with AFIRMA, and returned  with a result of benign. 02/19/2024: Underwent excision of right posterior cervical lymph node. Pathology was consistent with nodular lymphocyte predoominant Hodgkin's lymphoma. 03/11/2024: Establish care with [facility] Hematology/Oncology with Dr Norleen Kidney. 04/29/2024: completed definitive radiation therapy. Hx of bilateral rotator cuff repair. Imaging on 03/13/23: FINDINGS: The cervical spine is visualized from the occiput to the cervicothoracic junction. Exaggerated cervical lordosis. Alignment is otherwise  anatomic. Prevertebral soft tissues are within normal limits. Vertebral body and disc space heights are maintained. Multilevel uncovertebral hypertrophy and facet sclerosis. Severe right neural foraminal narrowing at C3-4 and C4-5. There may be minimal neural foraminal narrowing in the lower cervical spine on the left. Visualized lung apices are clear.  PATIENT GOALS:   to be educated about the signs and symptoms of lymphedema and learn post op HEP.   PAIN:  Are you having pain? 4/10 intermittently  PRECAUTIONS: risk of lymphedema  RED FLAGS: None   WEIGHT BEARING RESTRICTIONS: No  FALLS:  Has patient fallen in last 6 months? No Does the patient have a fear of falling that limits activity? 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  COGNITION: Overall cognitive status: Within functional limits for tasks assessed                  POSTURE:  Forward head and rounded shoulders posture  30 SEC SIT TO STAND: 13 reps in 30 sec without use of UEs which is  Average for patient's age  SHOULDER AROM:   WFL   CERVICAL AROM:   Percent limited 07/30/24 08/06/24  Flexion WFL WFL WFL  Extension 25% limited WFL WFL  Right lateral flexion 75% limited with pain 25% limited - tight 50% limited  Left lateral flexion 75% limited 50% limited 50% limited  Right rotation 25% limited with pain 25% limited 25% limited  Left rotation 25% limited 25% limited 25% limited    (Blank rows=not tested)  LYMPHEDEMA ASSESSMENT:    Circumference in cm  4 cm superior to sternal notch around neck 43  6 cm superior to sternal  notch around neck 43  8 cm superior to sternal notch around neck 44  R lateral nostril from base of nose to medial tragus   L lateral nostril from base of nose to medial tragus   R corner of mouth to where ear lobe meets face   L corner of mouth to where ear lobe meets face         (Blank rows=not tested)  Neck Disability Index score: 10 / 50 = 20.0 %  TREATMENT PERFORMED:  08/13/2024 Lengthy discussion with pt about his concerns for remaining pain that feels deep ER strength very weak with likely tear (prior RTC repair) Practiced D2 ext in sitting with manual resistance to right scapular region 2 x 10 and focus on posture and form. Tried ER with yellow bandwith towel roll under arm,but difficult and advised to go to neutral only. Also tried SL but unable to do. Supine ER from IR position and showed how to add light wt as tolerated. Reviewed HEP with scapular retraction and emphasis on scapular depression while performing; able to do  10 + 5 with good form and occasional Vcs for posture. Also did standing shoulder ext with green and emphasis on scapular depression and posture 1 x 10, 1 x 5. Tried standing horizontal abd but used poor form with gn so will continue with red up against the wall. Discussed orthopedic MD if it continues to bother him. Pt felt these modifications/emphasis address where his problem is. He has 1 more visit and then can't come for several weeks. May want DC or set up appt further out for follow up.    08/08/24: Therapeutic Activities  Standing with back against 1/2 foam roller and purple ball behind head while holding chin tuck: scapular strengthening exercises with red band x 10 reps each as follows: narrow and wide grip flexion, ER (on L only), horizontal abduction and diagonals x 10 reps each with pt returning therapist demo Chin tuck with purple ball against wall: 3 way shoulder with no weight x 5 reps each Standing wall angel with purple ball behind head  for chin tuck with stick raising overhead with good posture x 10 Dual Cable Machine: scapular retraction x 10 reps using 7 lbs with pt returning therapist demo and cues to avoid scapular hiking, lat pulls with 7lbs x 15 reps with v/c to avoid scapular hiking Manual Therapy STM to R upper traps, rhomboids, levator, posterior and lateral cervical muscles using cocoa butter and WAVE tool in L side lying with numerous areas of muscle tightness noted with improvement noted by end of session  08/06/24: Therapeutic Activities  Seated isometric cervical flexion, extension, bilateral sidebneding and bilateral rotation all x 5 reps with 5 sec holds with pt returning therapist demo Supine chin tuck with towel under head with 5 sec holds x 10  Standing wall angel with stick raising overhead with good posture x 10 Doorway stretch x 5 reps with 15 sec holds Standing neck retraction against wall x 5 with 5 sec holds with pt returning therapist demo Supine scapular series with red theraband and cervical retraction during (horz abd, bil er, narrow and wide grip, and bil D2) x 10 each returning therapist demo for postural strength and stability; Educated pt to keep R shoulder relaxed and retracted to avoid anterior shoulder discomfort Manual Therapy Manual traction, suboccipital release STM in supine to bil upper traps, bilateral posterior and lateral cervical muscles including SCM PROM in to bilateral rotation and lateral flexion   08/01/24: Therapeutic Activities  Supine scapular series with red theraband and cervical retraction during (horz abd, bil er, narrow and wide grip, and bil D2) x 10 each returning therapist demo for postural strength and stability; added to HEP. Pt had increased pain with limited available mobility with Rt er so instructed him to only move within tolerance, he verbalized good understanding.  Manual Therapy Manual traction, suboccipital release STM in supine to bil upper traps,  bilateral posterior and lateral cervical muscles including SCM, focusing on Rt>Lt insertion where increased palpable tightness, with cocoa butter PROM in to bilateral rotation and lateral flexion with and without overpressure to clavicle for increased stretch  07/30/24 Manual Therapy: Manual traction, suboccipital release STM in supine to upper traps, bilateral posterior and lateral cervical mucles PROM in to bilateral rotation and lateral flexion STM to R upper traps, rhomboids, levator, posterior and lateral cervical muscles using cocoa butter and WAVE tool in L side lying with numerous areas of muscle tightness noted with improvement noted by end of session   07/25/2024 Check cervical ROM prior  to treatment Manual traction, suboccipital release STM to B upper traps,upper  rhomboids, levator, posterior and lateral cervical muscles using cocoa butter. Left SL with wave tool to R upper traps, rhomboids, levator, posterior and lateral cervical muscles using cocoa butter, then dynamic cupping to same with small blue cup Supine cervical retractions into therapist hands x 10 PROM bilateral cervical rotation x 3-4 reps with CR stretch 3 times each side, PROM bilateral SB Educated in importance of proper siting posture on neck position Rechecked cervical ROM at end with good improvement and no increased pain. Mild tingle remained at UT region    PATIENT EDUCATION:  Education details: Supine scapular series with red theraband Person educated: Patient Education method: Explanation, Demonstration, VC's for technique, Handout Education comprehension: Patient verbalized understanding and returned demonstration, will benefit from further review  HOME EXERCISE PROGRAM: Patient was instructed today in a home exercise program today for head and neck range of motion exercises. These included neck and shoulder retraction. Patient was encouraged to do these 2-3 times a day, holding for 5 sec each and  completing for 5 reps. Pt was educated that once this becomes easier then hold the stretches for 30-60 seconds.  Educated pt to avoid stretches or movements that cause increased pain 08/01/24 - Supine scapular series   08/06/24: Access Code: [email or web address] URL: [email or web address] Date: 08/06/2024 Prepared by: Florina Lanis Carbon  Exercises - Cervical Retraction at Wall  - 1 x daily - 7 x weekly - 1 sets - 5-10 reps - 5 sec hold - Standing Isometric Cervical Sidebending with Manual Resistance  - 1 x daily - 7 x weekly - 1 sets - 5-10 reps - 5 sec hold - Standing Isometric Cervical Flexion with Manual Resistance  - 1 x daily - 7 x weekly - 1 sets - 5-10 reps - 5 sec hold - Seated Isometric Cervical Extension  - 1 x daily - 7 x weekly - 1 sets - 5-10 reps - 5 sec hold - Seated Isometric Cervical Rotation  - 1 x daily - 7 x weekly - 1 sets - 5-10 reps - 5 sec hold - Wall Angels  - 1 x daily - 7 x weekly - 1 sets - 5-10 reps - 5 sec hold - Supine Cervical Retraction with Towel  - 1 x daily - 7 x weekly - 1 sets - 10 reps - 5 sec hold - Doorway Pec Stretch at 90 Degrees Abduction  - 1 x daily - 7 x weekly - 1 sets - 3 reps - 15-60 sec hold  ASSESSMENT:  CLINICAL IMPRESSION:  Pt is 75% better with pain, but still bothered by annoying pain when he is at rest. Pt felt the emphasis on scapular depression/posture and ER weakness was beneficial and addressed his problem more than manual techniques. Able to demonstrate good understanding today and used good form with green band for scap retraction and extension.   Pt will benefit from skilled therapeutic intervention to improve on the following deficits: Decreased knowledge of precautions and postural dysfunction. Other deficits: decreased knowledge of condition, decreased ROM, decreased strength, increased fascial restrictions, increased muscle spasms, postural dysfunction, and pain  PT treatment/interventions: ADL/self-care home  management, pt/family education, therapeutic exercise. Other interventions [id number]- PT Re-evaluation, 97110-Therapeutic exercises, 97530- Therapeutic activity, [id number]- Neuromuscular re-education, 97535- Self Care, [id number]- Manual therapy, 97760- Orthotic Initial, [phone]- Orthotic/Prosthetic subsequent, and Patient/Family education  REHAB POTENTIAL: Good  CLINICAL DECISION MAKING: Stable/uncomplicated  EVALUATION COMPLEXITY: Low   GOALS: Goals reviewed with  patient? YES  LONG TERM GOALS: (STG=LTG)   Name Target Date  Goal status  1 Patient will be able to verbalize understanding of a home exercise program for cervical range of motion, posture, and walking.   Baseline:  No knowledge 07/17/2024 Achieved at eval  2 Patient will be able to verbalize understanding of proper sitting and standing posture. Baseline:  No knowledge 07/17/2024 Achieved at eval  3 Patient will be able to verbalize understanding of lymphedema risk and availability of treatment for this condition Baseline:  No knowledge 07/17/2024 Achieved at eval  4 Pt to be able to turn his head to look over his left and right shoulder while driving without increased pain and spasm in his neck.   08/14/24 New  5 Pt will be independent in a home exercise program for continued stretching and strengthening to improve posture and decrease neck pain. 08/14/24 NEW    6 Pt will demonstrate improved cervical lateral flexion and will only demonstrate 50% limitation instead of 75% limitation to allow improved motion/function 08/14/24 NEW    PLAN:  PT FREQUENCY/DURATION: 2x/wk for 4 wks  PLAN FOR NEXT SESSION: Review new HEP; STM to upper traps, levator, lateral and posterior cervical muscles, how are retraction exercises, begin isometrics and stretching once there is decreased muscle tightness.  ? Orthopedic MD if not resolved  Grayce JINNY Sheldon, PT 08/13/2024, 9:57 AM     Over Head Pull: Narrow and Wide Grip   Can[facility] [phone]   On  back, knees bent, feet flat, band across thighs, elbows straight but relaxed. Pull hands apart (start). Keeping elbows straight, bring arms up and over head, hands toward floor. Keep pull steady on band. Hold momentarily. Return slowly, keeping pull steady, back to start. Then do same with a wider grip on the band (past shoulder width) Repeat _5-10__ times. Band color __red____   Side Pull: Double Arm   On back, knees bent, feet flat. Arms perpendicular to body, shoulder level, elbows straight but relaxed. Pull arms out to sides, elbows straight. Resistance band comes across collarbones, hands toward floor. Hold momentarily. Slowly return to starting position. Repeat _5-10__ times. Band color _red____   Sword   On back, knees bent, feet flat, left hand on left hip, right hand above left. Pull right arm DIAGONALLY (hip to shoulder) across chest. Bring right arm along head toward floor. Hold momentarily. Slowly return to starting position. Repeat _5-10__ times. Do with left arm. Band color _red_____   Shoulder Rotation: Double Arm   On back, knees bent, feet flat, elbows tucked at sides, bent 90, hands palms up. Pull hands apart and down toward floor, keeping elbows near sides. Hold momentarily. Slowly return to starting position. Repeat _5-10__ times. Band color __red____

## 2024-08-15 ENCOUNTER — Ambulatory Visit: Admitting: Physical Therapy

## 2024-08-15 ENCOUNTER — Encounter: Payer: Self-pay | Admitting: Physical Therapy

## 2024-08-15 DIAGNOSIS — C8101 Nodular lymphocyte predominant Hodgkin lymphoma, lymph nodes of head, face, and neck: Secondary | ICD-10-CM

## 2024-08-15 DIAGNOSIS — M25611 Stiffness of right shoulder, not elsewhere classified: Secondary | ICD-10-CM | POA: Diagnosis not present

## 2024-08-15 DIAGNOSIS — L599 Disorder of the skin and subcutaneous tissue related to radiation, unspecified: Secondary | ICD-10-CM | POA: Diagnosis not present

## 2024-08-15 DIAGNOSIS — M542 Cervicalgia: Secondary | ICD-10-CM | POA: Diagnosis not present

## 2024-08-15 NOTE — Therapy (Signed)
 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 Jeffrie- follows- [facility] as needed for palpitations   Elevated liver function tests    GERD (gastroesophageal reflux disease)    HOH (hard of hearing)    wears HAs   Hypertension    Pneumonia    Premature atrial contraction    Thrombocytopenia    Past Surgical History:  Procedure Laterality Date   bilateral rotator surgery     COLONOSCOPY WITH PROPOFOL  N/A 03/08/2016   Procedure: COLONOSCOPY WITH PROPOFOL ;  Surgeon: [name], MD;  Location: [facility];  Service: Endoscopy;  Laterality: N/A;   HERNIA REPAIR  1970   right inguinal hernia   LYMPH NODE BIOPSY Right 02/19/2024   Procedure: LYMPH NODE BIOPSY;  Surgeon: [name], MD;  Location: [facility];  Service: General;  Laterality: Right;  Excisional biopsy right posterior cervical lymph nodes   Patient Active Problem List   Diagnosis Date Noted   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   Cervical lymphadenopathy 02/18/2024   Essential hypertension 04/08/2015   Bradycardia 03/30/2015   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   Elevated LFTs 02/10/2014   Pneumonia 02/06/2014   Thrombocytopenia 02/06/2014   High anion gap metabolic acidosis 02/06/2014   Sepsis (HCC) 02/06/2014    PCP: Dorn Sauers, MD  REFERRING PROVIDER: [name], MD  REFERRING DIAG:  C81.01 (ICD-10-CM) - 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 The pain goes from my lower neck and then deep . If I keep moving I'm fine. If I sit 10 mins its worse, or if I use my right shoulder its worse.   Pain is 75% better overall, but in a few minutes after sitting it starts hurting and then I have to move my arm around and its better. I can't tell if the manual things work, but I know I am overall better. This has really helped  PERTINENT HISTORY:  Nodular Lymphocyte Predominant Hodgkin's Lymphoma. Presented to PCP, Dr. Dorn Sauers with right neck and shoulder pain. Physical exam findings was notable for nodular mass in the right posterior cervical region.  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 Molecular genetic testing with AFIRMA, and returned with a result of benign. 02/19/2024: Underwent excision of right posterior cervical lymph node. Pathology was consistent with nodular lymphocyte predoominant Hodgkin's lymphoma. 03/11/2024: Establish care with [facility]On[facility] with Dr Norleen Kidney. 04/29/2024: completed definitive radiation therapy. Hx of bilateral rotator cuff repair. Imaging on 03/13/23: FINDINGS: The cervical spine is visualized from the occiput to the cervicothoracic junction. Exaggerated cervical lordosis. Alignment is otherwise anatomic. Prevertebral soft tissues are within normal limits. Vertebral body and disc space heights are maintained. Multilevel uncovertebral hypertrophy and facet sclerosis. Severe right neural foraminal narrowing at C3-4 and C4-5. There may be minimal neural foraminal narrowing in the lower cervical spine on the left. Visualized lung apices are clear.  PATIENT GOALS:   to be educated about the signs and symptoms of lymphedema and learn post op HEP.   PAIN:  Are you having pain? 4/10 intermittently  PRECAUTIONS: risk of lymphedema  RED FLAGS: None   WEIGHT BEARING RESTRICTIONS: No  FALLS:  Has patient fallen in last 6 months? No Does the patient have a fear of falling that limits activity? 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  COGNITION: Overall cognitive status: Within functional limits for tasks assessed                  POSTURE:  Forward head and rounded shoulders posture  30 SEC SIT TO STAND: 13 reps in 30 sec without use of UEs which is  Average for patient's age  SHOULDER AROM:   [facility]   CERVICAL AROM:   Percent limited 07/30/24 08/06/24 08/15/24  Flexion WFL WFL WFL WFL  Extension 25% limited WFL WFL WFL  Right lateral flexion 75% limited with pain 25% limited - tight 50% limited 60% limited  Left lateral flexion 75%  limited 50% limited 50% limited 60% limited  Right rotation 25% limited with pain 25% limited 25% limited 25% limited  Left rotation 25% limited 25% limited 25% limited 25% limited    (Blank rows=not tested)  LYMPHEDEMA ASSESSMENT:    Circumference in cm  4 cm superior to sternal notch around neck 43  6 cm superior to sternal notch around neck 43  8 cm superior to sternal notch around neck 44  R lateral nostril from base of nose to medial tragus   L lateral nostril from base of nose to medial tragus   R corner of mouth to where ear lobe meets face   L corner of mouth to where ear lobe meets face         (Blank rows=not tested)  Neck Disability Index score: 10 / 50 = 20.0 %  UPPER EXTREMITY AROM/PROM:  A/PROM RIGHT   08/15/24   Shoulder extension 65  Shoulder flexion 145  Shoulder abduction 152  Shoulder internal rotation 61  Shoulder external rotation 63    (Blank rows =  not tested)  A/PROM LEFT   08/15/24  Shoulder extension 72  Shoulder flexion 165  Shoulder abduction 172  Shoulder internal rotation 66  Shoulder external rotation 80    (Blank rows = not tested)   TREATMENT PERFORMED: 08/15/24: Therapeutic Exercise: Pulleys x 2 min in direction of flexion and 2 min in direction of abduction with pt returning therapist demo and v/c to not hike shoulder Ball up wall x 10 reps in direction of flexion and 10 reps in direction of R shoulder abduction with v/c not to hike shoulder Attempted finger ladder but this was not a challenge Therapeutic Activities  Standing with back against white foam roller and purple ball behind head while holding chin tuck: 3 way shoulder while focusing on keeping upright posture and chin tuck throughout Red band tied to // bars: resisted isometric walkouts in to R shoulder flexion, extension, IR and ER x 10 reps each with pt returning therapist demo and v/c to keep UE in neutral Dual Cable Machine: scapular retraction x 15 reps using 7 lbs  with pt returning therapist demo and cues to avoid scapular hiking  08/13/2024 Lengthy discussion with pt about his concerns for remaining pain that feels deep ER strength very weak with likely tear (prior RTC repair) Practiced D2 ext in sitting with manual resistance to right scapular region 2 x 10 and focus on posture and form. Tried ER with yellow bandwith towel roll under arm,but difficult and advised to go to neutral only. Also tried SL but unable to do. Supine ER from IR position and showed how to add light wt as tolerated. Reviewed HEP with scapular retraction and emphasis on scapular depression while performing; able to do 10 + 5 with good form and occasional Vcs for posture. Also did standing shoulder ext with green and emphasis on scapular depression and posture 1 x 10, 1 x 5. Tried standing horizontal abd but used poor form with gn so will continue with red up against the wall. Discussed orthopedic MD if it continues to bother him. Pt felt these modifications/emphasis address where his problem is. He has 1 more visit and then can't come for several weeks. May want DC or set up appt further out for follow up.    08/08/24: Therapeutic Activities  Standing with back against 1/2 foam roller and purple ball behind head while holding chin tuck: scapular strengthening exercises with red band x 10 reps each as follows: narrow and wide grip flexion, ER (on L only), horizontal abduction and diagonals x 10 reps each with pt returning therapist demo Chin tuck with purple ball against wall: 3 way shoulder with no weight x 5 reps each Standing wall angel with purple ball behind head for chin tuck with stick raising overhead with good posture x 10 Dual Cable Machine: scapular retraction x 10 reps using 7 lbs with pt returning therapist demo and cues to avoid scapular hiking, lat pulls with 7lbs x 15 reps with v/c to avoid scapular hiking Manual Therapy STM to R upper traps, rhomboids, levator,  posterior and lateral cervical muscles using cocoa butter and WAVE tool in L side lying with numerous areas of muscle tightness noted with improvement noted by end of session  08/06/24: Therapeutic Activities  Seated isometric cervical flexion, extension, bilateral sidebneding and bilateral rotation all x 5 reps with 5 sec holds with pt returning therapist demo Supine chin tuck with towel under head with 5 sec holds x 10  Standing wall angel with stick  raising overhead with good posture x 10 Doorway stretch x 5 reps with 15 sec holds Standing neck retraction against wall x 5 with 5 sec holds with pt returning therapist demo Supine scapular series with red theraband and cervical retraction during (horz abd, bil er, narrow and wide grip, and bil D2) x 10 each returning therapist demo for postural strength and stability; Educated pt to keep R shoulder relaxed and retracted to avoid anterior shoulder discomfort Manual Therapy Manual traction, suboccipital release STM in supine to bil upper traps, bilateral posterior and lateral cervical muscles including SCM PROM in to bilateral rotation and lateral flexion   08/01/24: Therapeutic Activities  Supine scapular series with red theraband and cervical retraction during (horz abd, bil er, narrow and wide grip, and bil D2) x 10 each returning therapist demo for postural strength and stability; added to HEP. Pt had increased pain with limited available mobility with Rt er so instructed him to only move within tolerance, he verbalized good understanding.  Manual Therapy Manual traction, suboccipital release STM in supine to bil upper traps, bilateral posterior and lateral cervical muscles including SCM, focusing on Rt>Lt insertion where increased palpable tightness, with cocoa butter PROM in to bilateral rotation and lateral flexion with and without overpressure to clavicle for increased stretch  07/30/24 Manual Therapy: Manual traction, suboccipital  release STM in supine to upper traps, bilateral posterior and lateral cervical mucles PROM in to bilateral rotation and lateral flexion STM to R upper traps, rhomboids, levator, posterior and lateral cervical muscles using cocoa butter and WAVE tool in L side lying with numerous areas of muscle tightness noted with improvement noted by end of session   07/25/2024 Check cervical ROM prior to treatment Manual traction, suboccipital release STM to B upper traps,upper  rhomboids, levator, posterior and lateral cervical muscles using cocoa butter. Left SL with wave tool to R upper traps, rhomboids, levator, posterior and lateral cervical muscles using cocoa butter, then dynamic cupping to same with small blue cup Supine cervical retractions into therapist hands x 10 PROM bilateral cervical rotation x 3-4 reps with CR stretch 3 times each side, PROM bilateral SB Educated in importance of proper siting posture on neck position Rechecked cervical ROM at end with good improvement and no increased pain. Mild tingle remained at UT region    PATIENT EDUCATION:  Education details: Supine scapular series with red theraband Person educated: Patient Education method: Explanation, Demonstration, VC's for technique, Handout Education comprehension: Patient verbalized understanding and returned demonstration, will benefit from further review  HOME EXERCISE PROGRAM: Patient was instructed today in a home exercise program today for head and neck range of motion exercises. These included neck and shoulder retraction. Patient was encouraged to do these 2-3 times a day, holding for 5 sec each and completing for 5 reps. Pt was educated that once this becomes easier then hold the stretches for 30-60 seconds.  Educated pt to avoid stretches or movements that cause increased pain 08/01/24 - Supine scapular series   Access Code: [facility] URL: [email or web address] Date: 08/15/2024 Prepared by: Florina Lanis Carbon  Exercises - Cervical Retraction at Wall  - 1 x daily - 7 x weekly - 1 sets - 5-10 reps - 5 sec hold - Standing Isometric Cervical Sidebending with Manual Resistance  - 1 x daily - 7 x weekly - 1 sets - 5-10 reps - 5 sec hold - Standing Isometric Cervical Flexion with Manual Resistance  - 1 x daily - 7 x weekly -  1 sets - 5-10 reps - 5 sec hold - Seated Isometric Cervical Extension  - 1 x daily - 7 x weekly - 1 sets - 5-10 reps - 5 sec hold - Seated Isometric Cervical Rotation  - 1 x daily - 7 x weekly - 1 sets - 5-10 reps - 5 sec hold - Wall Angels  - 1 x daily - 7 x weekly - 1 sets - 5-10 reps - 5 sec hold - Supine Cervical Retraction with Towel  - 1 x daily - 7 x weekly - 1 sets - 10 reps - 5 sec hold - Doorway Pec Stretch at 90 Degrees Abduction  - 1 x daily - 7 x weekly - 1 sets - 3 reps - 15-60 sec hold - Shoulder Internal Rotation Reactive Isometrics  - 1 x daily - 7 x weekly - 1 sets - 10 reps - Shoulder External Rotation Reactive Isometrics (Mirrored)  - 1 x daily - 7 x weekly - 1 sets - 10 reps - Shoulder Extension Reactive Isometrics with Elbow Extended  - 1 x daily - 7 x weekly - 1 sets - 10 reps - Standing Shoulder Flexion Reactive Isometrics with Elbow Extended  - 1 x daily - 7 x weekly - 1 sets - 10 reps  ASSESSMENT:  CLINICAL IMPRESSION: Assessed pt's progress towards goals in therapy. Pt is making progress towards his goals and he has met his goal for turning to look over his shoulder when he drives. He has nearly met his cervical ROM goal. Pt is still having difficulty when R shoulder ROM and strength. Measured R shoulder ROM and added appropriate goals. Continuing to work on posture especially with UE motion. Pt has several things to do over the next few weeks and will be on hold and then would benefit from additional skilled PT services to continue to progress towards goals and improve cervical and R shoulder ROM and posture to help decrease pain.   Pt will  benefit from skilled therapeutic intervention to improve on the following deficits: Decreased knowledge of precautions and postural dysfunction. Other deficits: decreased knowledge of condition, decreased ROM, decreased strength, increased fascial restrictions, increased muscle spasms, postural dysfunction, and pain  PT treatment/interventions: ADL/self-care home management, pt/family education, therapeutic exercise. Other interventions [id number]- PT Re-evaluation, 97110-Therapeutic exercises, 97530- Therapeutic activity, [id number]- Neuromuscular re-education, 97535- Self Care, [id number]- Manual therapy, 97760- Orthotic Initial, [id number]- Orthotic/Prosthetic subsequent, and Patient/Family education  REHAB POTENTIAL: Good  CLINICAL DECISION MAKING: Stable/uncomplicated  EVALUATION COMPLEXITY: Low   GOALS: Goals reviewed with patient? YES  LONG TERM GOALS: (STG=LTG)   Name Target Date  Goal status  1 Patient will be able to verbalize understanding of a home exercise program for cervical range of motion, posture, and walking.   Baseline:  No knowledge 07/17/2024 Achieved at eval  2 Patient will be able to verbalize understanding of proper sitting and standing posture. Baseline:  No knowledge 07/17/2024 Achieved at eval  3 Patient will be able to verbalize understanding of lymphedema risk and availability of treatment for this condition Baseline:  No knowledge 07/17/2024 Achieved at eval  4 Pt to be able to turn his head to look over his left and right shoulder while driving without increased pain and spasm in his neck.   08/14/24 MET 08/15/24  5 Pt will be independent in a home exercise program for continued stretching and strengthening to improve posture and decrease neck pain. 08/14/24 ONGOING 08/15/24    6 Pt will demonstrate improved  cervical lateral flexion and will only demonstrate 50% limitation instead of 75% limitation to allow improved motion/function 08/14/24 ONGOING 08/15/24  7 Pt will  demonstrate 165 degrees of R shoulder flexion to allow him to reach overhead.  10/03/24 NEW  8 Pt will demonstrate 165 degrees of R shoulder abduction to allow him to reach out to the side.  10/03/24 NEW    PLAN:  PT FREQUENCY/DURATION: on hold for 3 weeks and then 2x/wk for 4 wks  PLAN FOR NEXT SESSION: reassess ROM since pt has been on hold, how is HEP, continue R shoulder strengthening and ROM and neck ROM, postural exercises  [employer], PT 08/15/2024, 10:03 AM     Over Head Pull: Narrow and Wide Grip   Cancer Rehab [phone]   On back, knees bent, feet flat, band across thighs, elbows straight but relaxed. Pull hands apart (start). Keeping elbows straight, bring arms up and over head, hands toward floor. Keep pull steady on band. Hold momentarily. Return slowly, keeping pull steady, back to start. Then do same with a wider grip on the band (past shoulder width) Repeat _5-10__ times. Band color __red____   Side Pull: Double Arm   On back, knees bent, feet flat. Arms perpendicular to body, shoulder level, elbows straight but relaxed. Pull arms out to sides, elbows straight. Resistance band comes across collarbones, hands toward floor. Hold momentarily. Slowly return to starting position. Repeat _5-10__ times. Band color _red____   Sword   On back, knees bent, feet flat, left hand on left hip, right hand above left. Pull right arm DIAGONALLY (hip to shoulder) across chest. Bring right arm along head toward floor. Hold momentarily. Slowly return to starting position. Repeat _5-10__ times. Do with left arm. Band color _red_____   Shoulder Rotation: Double Arm   On back, knees bent, feet flat, elbows tucked at sides, bent 90, hands palms up. Pull hands apart and down toward floor, keeping elbows near sides. Hold momentarily. Slowly return to starting position. Repeat _5-10__ times. Band color __red____

## 2024-08-16 DIAGNOSIS — Z23 Encounter for immunization: Secondary | ICD-10-CM | POA: Diagnosis not present

## 2024-09-10 ENCOUNTER — Ambulatory Visit: Attending: Radiation Oncology

## 2024-09-10 DIAGNOSIS — M25611 Stiffness of right shoulder, not elsewhere classified: Secondary | ICD-10-CM | POA: Diagnosis present

## 2024-09-10 DIAGNOSIS — M542 Cervicalgia: Secondary | ICD-10-CM | POA: Insufficient documentation

## 2024-09-10 DIAGNOSIS — L599 Disorder of the skin and subcutaneous tissue related to radiation, unspecified: Secondary | ICD-10-CM | POA: Diagnosis present

## 2024-09-10 DIAGNOSIS — C8101 Nodular lymphocyte predominant Hodgkin lymphoma, lymph nodes of head, face, and neck: Secondary | ICD-10-CM | POA: Insufficient documentation

## 2024-09-10 NOTE — Therapy (Signed)
 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 Jeffrie- follows- [facility] as needed for palpitations   Elevated liver function tests    GERD (gastroesophageal reflux disease)    HOH (hard of hearing)    wears HAs   Hypertension    Pneumonia    Premature atrial contraction    Thrombocytopenia    Past Surgical History:  Procedure Laterality Date   bilateral rotator surgery     COLONOSCOPY WITH PROPOFOL  N/A 03/08/2016   Procedure: COLONOSCOPY WITH PROPOFOL ;  Surgeon: [name], MD;  Location: [facility];  Service: Endoscopy;  Laterality: N/A;   HERNIA REPAIR  1970   right inguinal hernia   LYMPH NODE BIOPSY Right 02/19/2024   Procedure: LYMPH NODE BIOPSY;  Surgeon: [name], MD;  Location: [facility];  Service: General;  Laterality: Right;  Excisional biopsy right posterior cervical lymph nodes   Patient Active Problem List   Diagnosis Date Noted   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   Cervical lymphadenopathy 02/18/2024   Essential hypertension 04/08/2015   Bradycardia 03/30/2015   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   Elevated LFTs 02/10/2014   Pneumonia 02/06/2014   Thrombocytopenia 02/06/2014   High anion gap metabolic acidosis 02/06/2014   Sepsis (HCC) 02/06/2014    PCP: Dorn Sauers, MD  REFERRING PROVIDER: [name], MD  REFERRING DIAG:  C81.01 (ICD-10-CM) - 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 We didn't win but it was fun.  Did a lot of casting but it held up well.Most of the time the pain is pretty good, but most is down deep.I have been working it more. It doesn't bother me much until evening, and then its there.    PERTINENT HISTORY:  Nodular Lymphocyte Predominant Hodgkin's Lymphoma. Presented to PCP, Dr. Dorn Sauers with right neck and shoulder pain. Physical exam findings was notable for nodular mass in the right posterior cervical region.  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 Molecular genetic testing with AFIRMA, and returned with a result of benign. 02/19/2024: Underwent  excision of right posterior cervical lymph node. Pathology was consistent with nodular lymphocyte predoominant Hodgkin's lymphoma. 03/11/2024: Establish care with [facility]On[facility] with Dr Norleen Kidney. 04/29/2024: completed definitive radiation therapy. Hx of bilateral rotator cuff repair. Imaging on 03/13/23: FINDINGS: The cervical spine is visualized from the occiput to the cervicothoracic junction. Exaggerated cervical lordosis. Alignment is otherwise anatomic. Prevertebral soft tissues  are within normal limits. Vertebral body and disc space heights are maintained. Multilevel uncovertebral hypertrophy and facet sclerosis. Severe right neural foraminal narrowing at C3-4 and C4-5. There may be minimal neural foraminal narrowing in the lower cervical spine on the left. Visualized lung apices are clear.  PATIENT GOALS:   to be educated about the signs and symptoms of lymphedema and learn post op HEP.   PAIN:  Are you having pain? 0/10- 6-7/ 10 at worst intermittently Deep down  PRECAUTIONS: risk of lymphedema  RED FLAGS: None   WEIGHT BEARING RESTRICTIONS: No  FALLS:  Has patient fallen in last 6 months? No Does the patient have a fear of falling that limits activity? 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  COGNITION: Overall cognitive status: Within functional limits for tasks assessed                  POSTURE:  Forward head and rounded shoulders posture  30 SEC SIT TO STAND: 13 reps in 30 sec without use of UEs which is  Average for patient's age  SHOULDER AROM:   [facility]   CERVICAL AROM:   Percent limited 07/30/24 08/06/24 08/15/24  Flexion WFL WFL WFL WFL  Extension 25% limited WFL WFL WFL  Right lateral flexion 75% limited with pain 25% limited - tight 50% limited 60% limited  Left lateral flexion 75% limited 50% limited 50% limited 60% limited  Right rotation 25% limited with pain 25% limited 25% limited 25% limited  Left rotation 25% limited 25% limited 25% limited 25% limited    (Blank rows=not tested)  LYMPHEDEMA ASSESSMENT:    Circumference in cm  4 cm superior  to sternal notch around neck 43  6 cm superior to sternal notch around neck 43  8 cm superior to sternal notch around neck 44  R lateral nostril from base of nose to medial tragus   L lateral nostril from base of nose to medial tragus   R corner of mouth to where ear lobe meets face   L corner of mouth to where ear lobe meets face         (Blank rows=not tested)  Neck Disability Index score: 10 / 50 = 20.0 %  UPPER EXTREMITY AROM/PROM:  A/PROM RIGHT   08/15/24   Shoulder extension 65  Shoulder flexion 145  Shoulder abduction 152  Shoulder internal rotation 61  Shoulder external rotation 63    (Blank rows = not tested)  A/PROM LEFT   08/15/24  Shoulder extension 72  Shoulder flexion 165  Shoulder abduction 172  Shoulder internal rotation 66  Shoulder external rotation 80    (Blank rows = not tested)   TREATMENT PERFORMED:  09/10/2024  Ball rolls on wall x 10 flex and  abduction to warm up Free motion Scapular retraction 2 x 10 emphasis on scapular depression 7# and maintaining core Lat pull downs 7# EA 2 x 10 with scapular depression Standing with back against white foam roller and purple ball behind head while holding chin tuck: 3 way shoulder while focusing on keeping upright posture and chin tuck throughout x 7 reps ea to approx 90 degrees 4 D ball rolls on wall with red x 15 Forward wall slide with lift off; emphasis on scapular depression Seated right scapular PNF with PT resistance to work on isolating scapular depression x 15 08/15/24: Therapeutic Exercise: Pulleys x 2 min in direction of flexion and 2 min in direction of abduction with pt returning therapist demo and v/c to not hike shoulder Ball up wall x 10 reps in direction of flexion and 10 reps in direction of R shoulder abduction with v/c not to hike shoulder Attempted finger ladder but this was not a challenge Therapeutic Activities  Standing with back against white foam roller and purple ball behind  head while holding chin tuck: 3 way shoulder while focusing on keeping upright posture and chin tuck throughout Red band tied to // bars: resisted isometric walkouts in to R shoulder flexion, extension, IR and ER x 10 reps each with pt returning therapist demo and v/c to keep UE in neutral Dual Cable Machine: scapular retraction x 15 reps using 7 lbs with pt returning therapist demo and cues to avoid scapular hiking Seated scapular depression 08/13/2024 Lengthy discussion with pt about his concerns for remaining pain that feels deep ER strength very weak with likely tear (prior RTC repair) Practiced D2 ext in sitting with manual resistance to right scapular region 2 x 10 and focus on posture and form. Tried ER with yellow bandwith towel roll under arm,but difficult and advised to go to neutral only. Also tried SL but unable to do. Supine ER from IR position and showed how to add light wt as tolerated. Reviewed HEP with scapular retraction and emphasis on scapular depression while performing; able to do 10 + 5 with good form and occasional Vcs for posture. Also did standing shoulder ext with green and emphasis on scapular depression and posture 1 x 10, 1 x 5. Tried standing horizontal abd but used poor form with gn so will continue with red up against the wall. Discussed orthopedic MD if it continues to bother him. Pt felt these modifications/emphasis address where his problem is. He has 1 more visit and then can't come for several weeks. May want DC or set up appt further out for follow up.    08/08/24: Therapeutic Activities  Standing with back against 1/2 foam roller and purple ball behind head while holding chin tuck: scapular strengthening exercises with red band x 10 reps each as follows: narrow and wide grip flexion, ER (on L only), horizontal abduction and diagonals x 10 reps each with pt returning therapist demo Chin tuck with purple ball against wall: 3 way shoulder with no weight x 5  reps each Standing wall angel with purple ball behind head for chin tuck with stick raising overhead with good posture x 10 Dual Cable Machine: scapular retraction x 10 reps using 7 lbs with pt returning therapist demo and cues to avoid scapular hiking, lat pulls with 7lbs x 15 reps with v/c to avoid scapular hiking Manual Therapy STM to R upper traps, rhomboids, levator, posterior and lateral cervical muscles using cocoa butter and WAVE tool in L side  lying with numerous areas of muscle tightness noted with improvement noted by end of session  08/06/24: Therapeutic Activities  Seated isometric cervical flexion, extension, bilateral sidebneding and bilateral rotation all x 5 reps with 5 sec holds with pt returning therapist demo Supine chin tuck with towel under head with 5 sec holds x 10  Standing wall angel with stick raising overhead with good posture x 10 Doorway stretch x 5 reps with 15 sec holds Standing neck retraction against wall x 5 with 5 sec holds with pt returning therapist demo Supine scapular series with red theraband and cervical retraction during (horz abd, bil er, narrow and wide grip, and bil D2) x 10 each returning therapist demo for postural strength and stability; Educated pt to keep R shoulder relaxed and retracted to avoid anterior shoulder discomfort Manual Therapy Manual traction, suboccipital release STM in supine to bil upper traps, bilateral posterior and lateral cervical muscles including SCM PROM in to bilateral rotation and lateral flexion   08/01/24: Therapeutic Activities  Supine scapular series with red theraband and cervical retraction during (horz abd, bil er, narrow and wide grip, and bil D2) x 10 each returning therapist demo for postural strength and stability; added to HEP. Pt had increased pain with limited available mobility with Rt er so instructed him to only move within tolerance, he verbalized good understanding.  Manual Therapy Manual traction,  suboccipital release STM in supine to bil upper traps, bilateral posterior and lateral cervical muscles including SCM, focusing on Rt>Lt insertion where increased palpable tightness, with cocoa butter PROM in to bilateral rotation and lateral flexion with and without overpressure to clavicle for increased stretch  07/30/24 Manual Therapy: Manual traction, suboccipital release STM in supine to upper traps, bilateral posterior and lateral cervical mucles PROM in to bilateral rotation and lateral flexion STM to R upper traps, rhomboids, levator, posterior and lateral cervical muscles using cocoa butter and WAVE tool in L side lying with numerous areas of muscle tightness noted with improvement noted by end of session   07/25/2024 Check cervical ROM prior to treatment Manual traction, suboccipital release STM to B upper traps,upper  rhomboids, levator, posterior and lateral cervical muscles using cocoa butter. Left SL with wave tool to R upper traps, rhomboids, levator, posterior and lateral cervical muscles using cocoa butter, then dynamic cupping to same with small blue cup Supine cervical retractions into therapist hands x 10 PROM bilateral cervical rotation x 3-4 reps with CR stretch 3 times each side, PROM bilateral SB Educated in importance of proper siting posture on neck position Rechecked cervical ROM at end with good improvement and no increased pain. Mild tingle remained at UT region    PATIENT EDUCATION:  Education details: Supine scapular series with red theraband Person educated: Patient Education method: Explanation, Demonstration, VC's for technique, Handout Education comprehension: Patient verbalized understanding and returned demonstration, will benefit from further review  HOME EXERCISE PROGRAM: Patient was instructed today in a home exercise program today for head and neck range of motion exercises. These included neck and shoulder retraction. Patient was encouraged to do  these 2-3 times a day, holding for 5 sec each and completing for 5 reps. Pt was educated that once this becomes easier then hold the stretches for 30-60 seconds.  Educated pt to avoid stretches or movements that cause increased pain 08/01/24 - Supine scapular series   Access Code: [facility] URL: [email or web address] Date: 08/15/2024 Prepared by: Florina Lanis Carbon  Exercises - Cervical Retraction at [facility]  -  1 x daily - 7 x weekly - 1 sets - 5-10 reps - 5 sec hold - Standing Isometric Cervical Sidebending with Manual Resistance  - 1 x daily - 7 x weekly - 1 sets - 5-10 reps - 5 sec hold - Standing Isometric Cervical Flexion with Manual Resistance  - 1 x daily - 7 x weekly - 1 sets - 5-10 reps - 5 sec hold - Seated Isometric Cervical Extension  - 1 x daily - 7 x weekly - 1 sets - 5-10 reps - 5 sec hold - Seated Isometric Cervical Rotation  - 1 x daily - 7 x weekly - 1 sets - 5-10 reps - 5 sec hold - Wall Angels  - 1 x daily - 7 x weekly - 1 sets - 5-10 reps - 5 sec hold - Supine Cervical Retraction with Towel  - 1 x daily - 7 x weekly - 1 sets - 10 reps - 5 sec hold - Doorway Pec Stretch at 90 Degrees Abduction  - 1 x daily - 7 x weekly - 1 sets - 3 reps - 15-60 sec hold - Shoulder Internal Rotation Reactive Isometrics  - 1 x daily - 7 x weekly - 1 sets - 10 reps - Shoulder External Rotation Reactive Isometrics (Mirrored)  - 1 x daily - 7 x weekly - 1 sets - 10 reps - Shoulder Extension Reactive Isometrics with Elbow Extended  - 1 x daily - 7 x weekly - 1 sets - 10 reps - Standing Shoulder Flexion Reactive Isometrics with Elbow Extended  - 1 x daily - 7 x weekly - 1 sets - 10 reps  ASSESSMENT:  CLINICAL IMPRESSION: Spent session working on scapular depression and form with all exercises. Pt requires multiple VC's for form and fatigued very quickly especially with ball rolls on wall.  Pt will benefit from skilled therapeutic intervention to improve on the following deficits:  Decreased knowledge of precautions and postural dysfunction. Other deficits: decreased knowledge of condition, decreased ROM, decreased strength, increased fascial restrictions, increased muscle spasms, postural dysfunction, and pain  PT treatment/interventions: ADL/self-care home management, pt/family education, therapeutic exercise. Other interventions [id number]- PT Re-evaluation, 97110-Therapeutic exercises, 97530- Therapeutic activity, [id number]- Neuromuscular re-education, 97535- Self Care, [id number]- Manual therapy, 97760- Orthotic Initial, [phone]- Orthotic/Prosthetic subsequent, and Patient/Family education  REHAB POTENTIAL: Good  CLINICAL DECISION MAKING: Stable/uncomplicated  EVALUATION COMPLEXITY: Low   GOALS: Goals reviewed with patient? YES  LONG TERM GOALS: (STG=LTG)   Name Target Date  Goal status  1 Patient will be able to verbalize understanding of a home exercise program for cervical range of motion, posture, and walking.   Baseline:  No knowledge 07/17/2024 Achieved at eval  2 Patient will be able to verbalize understanding of proper sitting and standing posture. Baseline:  No knowledge 07/17/2024 Achieved at eval  3 Patient will be able to verbalize understanding of lymphedema risk and availability of treatment for this condition Baseline:  No knowledge 07/17/2024 Achieved at eval  4 Pt to be able to turn his head to look over his left and right shoulder while driving without increased pain and spasm in his neck.   08/14/24 MET 08/15/24  5 Pt will be independent in a home exercise program for continued stretching and strengthening to improve posture and decrease neck pain. 08/14/24 ONGOING 08/15/24    6 Pt will demonstrate improved cervical lateral flexion and will only demonstrate 50% limitation instead of 75% limitation to allow improved motion/function 08/14/24 ONGOING 08/15/24  7  Pt will demonstrate 165 degrees of R shoulder flexion to allow him to reach overhead.  10/03/24 NEW   8 Pt will demonstrate 165 degrees of R shoulder abduction to allow him to reach out to the side.  10/03/24 NEW    PLAN:  PT FREQUENCY/DURATION: on hold for 3 weeks and then 2x/wk for 4 wks  PLAN FOR NEXT SESSION: reassess ROM since pt has been on hold, how is HEP, continue R shoulder strengthening and ROM and neck ROM, postural exercises  Grayce JINNY Sheldon, PT 09/10/2024, 9:00 AM     Over Head Pull: Narrow and Wide Grip   Cancer Rehab [phone]   On back, knees bent, feet flat, band across thighs, elbows straight but relaxed. Pull hands apart (start). Keeping elbows straight, bring arms up and over head, hands toward floor. Keep pull steady on band. Hold momentarily. Return slowly, keeping pull steady, back to start. Then do same with a wider grip on the band (past shoulder width) Repeat _5-10__ times. Band color __red____   Side Pull: Double Arm   On back, knees bent, feet flat. Arms perpendicular to body, shoulder level, elbows straight but relaxed. Pull arms out to sides, elbows straight. Resistance band comes across collarbones, hands toward floor. Hold momentarily. Slowly return to starting position. Repeat _5-10__ times. Band color _red____   Sword   On back, knees bent, feet flat, left hand on left hip, right hand above left. Pull right arm DIAGONALLY (hip to shoulder) across chest. Bring right arm along head toward floor. Hold momentarily. Slowly return to starting position. Repeat _5-10__ times. Do with left arm. Band color _red_____   Shoulder Rotation: Double Arm   On back, knees bent, feet flat, elbows tucked at sides, bent 90, hands palms up. Pull hands apart and down toward floor, keeping elbows near sides. Hold momentarily. Slowly return to starting position. Repeat _5-10__ times. Band color __red____

## 2024-09-13 ENCOUNTER — Ambulatory Visit

## 2024-09-13 DIAGNOSIS — C8101 Nodular lymphocyte predominant Hodgkin lymphoma, lymph nodes of head, face, and neck: Secondary | ICD-10-CM

## 2024-09-13 DIAGNOSIS — M25611 Stiffness of right shoulder, not elsewhere classified: Secondary | ICD-10-CM | POA: Diagnosis not present

## 2024-09-13 DIAGNOSIS — L599 Disorder of the skin and subcutaneous tissue related to radiation, unspecified: Secondary | ICD-10-CM | POA: Diagnosis not present

## 2024-09-13 DIAGNOSIS — M542 Cervicalgia: Secondary | ICD-10-CM | POA: Diagnosis not present

## 2024-09-13 NOTE — Therapy (Signed)
 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 Jeffrie- follows- [facility] as needed for palpitations   Elevated liver function tests    GERD (gastroesophageal reflux disease)    HOH (hard of hearing)    wears HAs   Hypertension    Pneumonia    Premature atrial contraction    Thrombocytopenia    Past Surgical History:  Procedure Laterality Date   bilateral rotator surgery     COLONOSCOPY WITH PROPOFOL  N/A 03/08/2016   Procedure: COLONOSCOPY WITH PROPOFOL ;  Surgeon: [name], MD;  Location: [facility];  Service: Endoscopy;  Laterality: N/A;   HERNIA REPAIR  1970   right inguinal hernia   LYMPH NODE BIOPSY Right 02/19/2024   Procedure: LYMPH NODE BIOPSY;  Surgeon: [name], MD;  Location: [facility];  Service: General;  Laterality: Right;  Excisional biopsy right posterior cervical lymph nodes   Patient Active Problem List   Diagnosis Date Noted   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   Cervical lymphadenopathy 02/18/2024   Essential hypertension 04/08/2015   Bradycardia 03/30/2015   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   Elevated LFTs 02/10/2014   Pneumonia 02/06/2014   Thrombocytopenia 02/06/2014   High anion gap metabolic acidosis 02/06/2014   Sepsis (HCC) 02/06/2014    PCP: Dorn Sauers, MD  REFERRING PROVIDER: [name], MD  REFERRING DIAG:  C81.01 (ICD-10-CM) - 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 I was sore afterwards and tired.  PERTINENT HISTORY:  Nodular Lymphocyte Predominant Hodgkin's Lymphoma. Presented to PCP, Dr. Dorn Sauers with right neck and shoulder pain. Physical exam findings was notable for nodular mass in the right posterior cervical region.  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 Molecular genetic testing with AFIRMA, and returned with a result of benign. 02/19/2024: Underwent excision of right posterior cervical lymph node. Pathology was consistent with nodular lymphocyte predoominant Hodgkin's lymphoma. 03/11/2024: Establish care with [facility] with Dr Norleen Kidney. 04/29/2024: completed definitive radiation therapy. Hx of bilateral rotator cuff repair. Imaging on 03/13/23: FINDINGS: The cervical spine is  visualized from the occiput to the cervicothoracic junction. Exaggerated cervical lordosis. Alignment is otherwise anatomic. Prevertebral soft tissues are within normal limits. Vertebral body and disc space heights are maintained. Multilevel uncovertebral hypertrophy and facet sclerosis. Severe right neural foraminal narrowing at C3-4 and C4-5.  There may be minimal neural foraminal narrowing in the lower cervical spine on the left. Visualized lung apices are clear.  PATIENT GOALS:   to be educated about the signs and symptoms of lymphedema and learn post op HEP.   PAIN:  Are you having pain? 0/10- 6-7/ 10 at worst intermittently Deep down  PRECAUTIONS: risk of lymphedema  RED FLAGS: None   WEIGHT BEARING RESTRICTIONS: No  FALLS:  Has patient fallen in last 6 months? No Does the patient have a fear of falling that limits activity? 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  COGNITION: Overall cognitive status: Within functional limits for tasks assessed                  POSTURE:  Forward head and rounded shoulders posture  30 SEC SIT TO STAND: 13 reps in 30 sec without use of UEs which is  Average for patient's age  SHOULDER AROM:   [facility]   CERVICAL AROM:   Percent limited 07/30/24 08/06/24 08/15/24  Flexion WFL WFL WFL WFL  Extension 25% limited WFL WFL WFL  Right lateral flexion 75% limited with pain 25% limited - tight 50% limited 60% limited  Left lateral flexion 75% limited 50% limited 50% limited 60% limited  Right rotation 25% limited with pain 25% limited 25% limited 25% limited  Left rotation 25% limited 25% limited 25% limited 25% limited    (Blank rows=not tested)  LYMPHEDEMA ASSESSMENT:    Circumference in cm  4 cm superior to sternal notch around neck 43  6 cm superior to sternal notch around neck 43  8 cm superior to sternal notch around neck 44  R lateral nostril from base of nose to medial tragus   L lateral  nostril from base of nose to medial tragus   R corner of mouth to where ear lobe meets face   L corner of mouth to where ear lobe meets face         (Blank rows=not tested)  Neck Disability Index score: 10 / 50 = 20.0 %  UPPER EXTREMITY AROM/PROM:  A/PROM RIGHT   08/15/24   Shoulder extension 65  Shoulder flexion 145  Shoulder abduction 152  Shoulder internal rotation 61  Shoulder external rotation 63    (Blank rows = not tested)  A/PROM LEFT   08/15/24  Shoulder extension 72  Shoulder flexion 165  Shoulder abduction 172  Shoulder internal rotation 66  Shoulder external rotation 80    (Blank rows = not tested)   TREATMENT PERFORMED:  09/13/2024  UBE x 5 min, 2:30 forward and back, Manual level 3 Body Blade 2 x 1 min with blade vertical, 2 x 1 min blade in front horizontal 4D ball rolls red x 15 Foam roll on wall with purple ball;flexion, scaption, abd x 6 ea, horizontal abd 2 x 5 red band  Wall pushups with a plus x 20 Red band on // bars; isometric walkouts x 8 for ER Scapular retraction on Free motion x 15 reps 7 plates, lat pulls 7 pl ea x 15 09/10/2024  Ball rolls on wall x 10 flex and abduction to warm up Free motion Scapular retraction 2 x 10 emphasis on scapular depression 7# and maintaining core Lat pull downs 7# EA 2 x 10 with scapular depression Standing with back against white foam roller and purple ball behind head while holding chin tuck: 3 way shoulder while focusing on keeping upright posture and chin tuck throughout x 7 reps ea to approx 90 degrees 4 D ball rolls on wall with red x 15 Forward wall slide with lift off; emphasis on scapular depression Seated right scapular PNF with PT resistance to work on isolating scapular depression x 15  08/15/24: Therapeutic Exercise: Pulleys x 2 min in direction of flexion and 2 min in direction of abduction with pt returning therapist demo and v/c to not hike shoulder Ball up wall x 10 reps in direction of  flexion and 10 reps in direction of R shoulder abduction with v/c not to hike shoulder Attempted finger ladder but this was not a challenge Therapeutic Activities  Standing with back against white foam roller and purple ball behind head while holding chin tuck: 3 way shoulder while focusing on keeping upright posture and chin tuck throughout Red band tied to // bars: resisted isometric walkouts in to R shoulder flexion, extension, IR and ER x 10 reps each with pt returning therapist demo and v/c to keep UE in neutral Dual Cable Machine: scapular retraction x 15 reps using 7 lbs with pt returning therapist demo and cues to avoid scapular hiking Seated scapular depression 08/13/2024 Lengthy discussion with pt about his concerns for remaining pain that feels deep ER strength very weak with likely tear (prior RTC repair) Practiced D2 ext in sitting with manual resistance to right scapular region 2 x 10 and focus on posture and form. Tried ER with yellow bandwith towel roll under arm,but difficult and advised to go to neutral only. Also tried SL but unable to do. Supine ER from IR position and showed how to add light wt as tolerated. Reviewed HEP with scapular retraction and emphasis on scapular depression while performing; able to do 10 + 5 with good form and occasional Vcs for posture. Also did standing shoulder ext with green and emphasis on scapular depression and posture 1 x 10, 1 x 5. Tried standing horizontal abd but used poor form with gn so will continue with red up against the wall. Discussed orthopedic MD if it continues to bother him. Pt felt these modifications/emphasis address where his problem is. He has 1 more visit and then can't come for several weeks. May want DC or set up appt further out for follow up.    08/08/24: Therapeutic Activities  Standing with back against 1/2 foam roller and purple ball behind head while holding chin tuck: scapular strengthening exercises with red band  x 10 reps each as follows: narrow and wide grip flexion, ER (on L only), horizontal abduction and diagonals x 10 reps each with pt returning therapist demo Chin tuck with purple ball against wall: 3 way shoulder with no weight x 5 reps each Standing wall angel with purple ball behind head for chin tuck with stick raising overhead with good posture x 10 Dual Cable Machine: scapular retraction x 10 reps using 7 lbs with  pt returning therapist demo and cues to avoid scapular hiking, lat pulls with 7lbs x 15 reps with v/c to avoid scapular hiking Manual Therapy STM to R upper traps, rhomboids, levator, posterior and lateral cervical muscles using cocoa butter and WAVE tool in L side lying with numerous areas of muscle tightness noted with improvement noted by end of session  08/06/24: Therapeutic Activities  Seated isometric cervical flexion, extension, bilateral sidebneding and bilateral rotation all x 5 reps with 5 sec holds with pt returning therapist demo Supine chin tuck with towel under head with 5 sec holds x 10  Standing wall angel with stick raising overhead with good posture x 10 Doorway stretch x 5 reps with 15 sec holds Standing neck retraction against wall x 5 with 5 sec holds with pt returning therapist demo Supine scapular series with red theraband and cervical retraction during (horz abd, bil er, narrow and wide grip, and bil D2) x 10 each returning therapist demo for postural strength and stability; Educated pt to keep R shoulder relaxed and retracted to avoid anterior shoulder discomfort Manual Therapy Manual traction, suboccipital release STM in supine to bil upper traps, bilateral posterior and lateral cervical muscles including SCM PROM in to bilateral rotation and lateral flexion   08/01/24: Therapeutic Activities  Supine scapular series with red theraband and cervical retraction during (horz abd, bil er, narrow and wide grip, and bil D2) x 10 each returning therapist demo for  postural strength and stability; added to HEP. Pt had increased pain with limited available mobility with Rt er so instructed him to only move within tolerance, he verbalized good understanding.  Manual Therapy Manual traction, suboccipital release STM in supine to bil upper traps, bilateral posterior and lateral cervical muscles including SCM, focusing on Rt>Lt insertion where increased palpable tightness, with cocoa butter PROM in to bilateral rotation and lateral flexion with and without overpressure to clavicle for increased stretch  07/30/24 Manual Therapy: Manual traction, suboccipital release STM in supine to upper traps, bilateral posterior and lateral cervical mucles PROM in to bilateral rotation and lateral flexion STM to R upper traps, rhomboids, levator, posterior and lateral cervical muscles using cocoa butter and WAVE tool in L side lying with numerous areas of muscle tightness noted with improvement noted by end of session   07/25/2024 Check cervical ROM prior to treatment Manual traction, suboccipital release STM to B upper traps,upper  rhomboids, levator, posterior and lateral cervical muscles using cocoa butter. Left SL with wave tool to R upper traps, rhomboids, levator, posterior and lateral cervical muscles using cocoa butter, then dynamic cupping to same with small blue cup Supine cervical retractions into therapist hands x 10 PROM bilateral cervical rotation x 3-4 reps with CR stretch 3 times each side, PROM bilateral SB Educated in importance of proper siting posture on neck position Rechecked cervical ROM at end with good improvement and no increased pain. Mild tingle remained at UT region    PATIENT EDUCATION:  Education details: Supine scapular series with red theraband Person educated: Patient Education method: Explanation, Demonstration, VC's for technique, Handout Education comprehension: Patient verbalized understanding and returned demonstration, will  benefit from further review  HOME EXERCISE PROGRAM: Patient was instructed today in a home exercise program today for head and neck range of motion exercises. These included neck and shoulder retraction. Patient was encouraged to do these 2-3 times a day, holding for 5 sec each and completing for 5 reps. Pt was educated that once this becomes easier then  hold the stretches for 30-60 seconds.  Educated pt to avoid stretches or movements that cause increased pain 08/01/24 - Supine scapular series   Access Code: [facility] URL: [email or web address] Date: 08/15/2024 Prepared by: Florina Lanis Carbon  Exercises - Cervical Retraction at Wall  - 1 x daily - 7 x weekly - 1 sets - 5-10 reps - 5 sec hold - Standing Isometric Cervical Sidebending with Manual Resistance  - 1 x daily - 7 x weekly - 1 sets - 5-10 reps - 5 sec hold - Standing Isometric Cervical Flexion with Manual Resistance  - 1 x daily - 7 x weekly - 1 sets - 5-10 reps - 5 sec hold - Seated Isometric Cervical Extension  - 1 x daily - 7 x weekly - 1 sets - 5-10 reps - 5 sec hold - Seated Isometric Cervical Rotation  - 1 x daily - 7 x weekly - 1 sets - 5-10 reps - 5 sec hold - Wall Angels  - 1 x daily - 7 x weekly - 1 sets - 5-10 reps - 5 sec hold - Supine Cervical Retraction with Towel  - 1 x daily - 7 x weekly - 1 sets - 10 reps - 5 sec hold - Doorway Pec Stretch at 90 Degrees Abduction  - 1 x daily - 7 x weekly - 1 sets - 3 reps - 15-60 sec hold - Shoulder Internal Rotation Reactive Isometrics  - 1 x daily - 7 x weekly - 1 sets - 10 reps - Shoulder External Rotation Reactive Isometrics (Mirrored)  - 1 x daily - 7 x weekly - 1 sets - 10 reps - Shoulder Extension Reactive Isometrics with Elbow Extended  - 1 x daily - 7 x weekly - 1 sets - 10 reps - Standing Shoulder Flexion Reactive Isometrics with Elbow Extended  - 1 x daily - 7 x weekly - 1 sets - 10 reps  ASSESSMENT:  CLINICAL IMPRESSION: Initiated UBE, body blade  today in addition to other stabilization exercises. Pt did an excellent job focusing on posture while engaged in activities. Body blade was challenging at first, but with practice did very well. Band walkouts with red band very challenging. Fatigued after exercises, but did well  Pt will benefit from skilled therapeutic intervention to improve on the following deficits: Decreased knowledge of precautions and postural dysfunction. Other deficits: decreased knowledge of condition, decreased ROM, decreased strength, increased fascial restrictions, increased muscle spasms, postural dysfunction, and pain  PT treatment/interventions: ADL/self-care home management, pt/family education, therapeutic exercise. Other interventions [id number]- PT Re-evaluation, 97110-Therapeutic exercises, 97530- Therapeutic activity, [id number]- Neuromuscular re-education, 97535- Self Care, [id number]- Manual therapy, 97760- Orthotic Initial, [id number]- Orthotic/Prosthetic subsequent, and Patient/Family education  REHAB POTENTIAL: Good  CLINICAL DECISION MAKING: Stable/uncomplicated  EVALUATION COMPLEXITY: Low   GOALS: Goals reviewed with patient? YES  LONG TERM GOALS: (STG=LTG)   Name Target Date  Goal status  1 Patient will be able to verbalize understanding of a home exercise program for cervical range of motion, posture, and walking.   Baseline:  No knowledge 07/17/2024 Achieved at eval  2 Patient will be able to verbalize understanding of proper sitting and standing posture. Baseline:  No knowledge 07/17/2024 Achieved at eval  3 Patient will be able to verbalize understanding of lymphedema risk and availability of treatment for this condition Baseline:  No knowledge 07/17/2024 Achieved at eval  4 Pt to be able to turn his head to look over his left and right shoulder while driving without increased  pain and spasm in his neck.   08/14/24 MET 08/15/24  5 Pt will be independent in a home exercise program for continued stretching and  strengthening to improve posture and decrease neck pain. 08/14/24 ONGOING 08/15/24    6 Pt will demonstrate improved cervical lateral flexion and will only demonstrate 50% limitation instead of 75% limitation to allow improved motion/function 08/14/24 ONGOING 08/15/24  7 Pt will demonstrate 165 degrees of R shoulder flexion to allow him to reach overhead.  10/03/24 NEW  8 Pt will demonstrate 165 degrees of R shoulder abduction to allow him to reach out to the side.  10/03/24 NEW    PLAN:  PT FREQUENCY/DURATION: on hold for 3 weeks and then 2x/wk for 4 wks  PLAN FOR NEXT SESSION: how is HEP, continue R shoulder strengthening and ROM and neck ROM, postural exercises  Grayce JINNY Sheldon, PT 09/13/2024, 8:53 AM     Over Head Pull: Narrow and Wide Grip   Cancer Rehab [phone]   On back, knees bent, feet flat, band across thighs, elbows straight but relaxed. Pull hands apart (start). Keeping elbows straight, bring arms up and over head, hands toward floor. Keep pull steady on band. Hold momentarily. Return slowly, keeping pull steady, back to start. Then do same with a wider grip on the band (past shoulder width) Repeat _5-10__ times. Band color __red____   Side Pull: Double Arm   On back, knees bent, feet flat. Arms perpendicular to body, shoulder level, elbows straight but relaxed. Pull arms out to sides, elbows straight. Resistance band comes across collarbones, hands toward floor. Hold momentarily. Slowly return to starting position. Repeat _5-10__ times. Band color _red____   Sword   On back, knees bent, feet flat, left hand on left hip, right hand above left. Pull right arm DIAGONALLY (hip to shoulder) across chest. Bring right arm along head toward floor. Hold momentarily. Slowly return to starting position. Repeat _5-10__ times. Do with left arm. Band color _red_____   Shoulder Rotation: Double Arm   On back, knees bent, feet flat, elbows tucked at sides, bent 90, hands palms  up. Pull hands apart and down toward floor, keeping elbows near sides. Hold momentarily. Slowly return to starting position. Repeat _5-10__ times. Band color __red____

## 2024-09-16 ENCOUNTER — Ambulatory Visit

## 2024-09-16 DIAGNOSIS — M542 Cervicalgia: Secondary | ICD-10-CM

## 2024-09-16 DIAGNOSIS — M25611 Stiffness of right shoulder, not elsewhere classified: Secondary | ICD-10-CM

## 2024-09-16 DIAGNOSIS — C8101 Nodular lymphocyte predominant Hodgkin lymphoma, lymph nodes of head, face, and neck: Secondary | ICD-10-CM | POA: Diagnosis not present

## 2024-09-16 DIAGNOSIS — L599 Disorder of the skin and subcutaneous tissue related to radiation, unspecified: Secondary | ICD-10-CM

## 2024-09-16 NOTE — Therapy (Signed)
 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 Jeffrie- follows- [facility] as needed for palpitations   Elevated liver function tests    GERD (gastroesophageal reflux disease)    HOH (hard of hearing)    wears HAs   Hypertension    Pneumonia    Premature atrial contraction    Thrombocytopenia    Past Surgical History:  Procedure Laterality Date   bilateral rotator surgery     COLONOSCOPY WITH PROPOFOL  N/A 03/08/2016   Procedure: COLONOSCOPY WITH PROPOFOL ;  Surgeon: [name], MD;  Location: [facility];  Service: Endoscopy;  Laterality: N/A;   HERNIA REPAIR  1970   right inguinal hernia   LYMPH NODE BIOPSY Right 02/19/2024   Procedure: LYMPH NODE BIOPSY;  Surgeon: [name], MD;  Location: [facility];  Service: General;  Laterality: Right;  Excisional biopsy right posterior cervical lymph nodes   Patient Active Problem List   Diagnosis Date Noted   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   Cervical lymphadenopathy 02/18/2024   Essential hypertension 04/08/2015   Bradycardia 03/30/2015   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   Elevated LFTs 02/10/2014   Pneumonia 02/06/2014   Thrombocytopenia 02/06/2014   High anion gap metabolic acidosis 02/06/2014   Sepsis (HCC) 02/06/2014    PCP: Dorn Sauers, MD  REFERRING PROVIDER: [name], MD  REFERRING DIAG:  C81.01 (ICD-10-CM) - 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 Sitting here now the pain is down in there. My ear is still  numb and it travels down deep to my shoulder. I am better with strength and posture since being here.The pain goes away for a while, and then its a sharpness that you have to move your arm around to get it to go away. The pain is not changing. My posture and strength has improved greatly.  PERTINENT HISTORY:  Nodular Lymphocyte Predominant Hodgkin's Lymphoma. Presented to PCP, Dr. Dorn Sauers with right neck and shoulder pain. Physical exam findings was notable for nodular mass in the right posterior cervical region.  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 Molecular genetic testing with AFIRMA, and returned with a result of benign. 02/19/2024: Underwent excision of right posterior cervical lymph node. Pathology was consistent with nodular lymphocyte predoominant Hodgkin's lymphoma. 03/11/2024: Establish care with [facility] Hematology/Oncology with Dr Norleen Kidney. 04/29/2024: completed definitive radiation therapy. Hx of bilateral rotator cuff repair. Imaging on 03/13/23: FINDINGS: The cervical spine is visualized from the occiput to  the cervicothoracic junction. Exaggerated cervical lordosis. Alignment is otherwise anatomic. Prevertebral soft tissues are within normal limits. Vertebral body and disc space heights are maintained. Multilevel uncovertebral hypertrophy and facet sclerosis. Severe right neural foraminal narrowing at C3-4 and C4-5. There may be minimal neural foraminal narrowing in the lower cervical spine on the left. Visualized lung apices are clear.  PATIENT GOALS:   to be educated about the signs and symptoms of lymphedema and learn post op HEP.   PAIN:  Are you having pain? 0/10- 6-7/ 10 at worst intermittently Deep down  PRECAUTIONS: risk of lymphedema  RED FLAGS: None   WEIGHT BEARING RESTRICTIONS: No  FALLS:  Has patient fallen in last 6 months? No Does the patient have a fear of falling that limits activity? 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  COGNITION: Overall cognitive status: Within functional limits for tasks assessed                  POSTURE:  Forward head and rounded shoulders posture  30 SEC SIT TO STAND: 13 reps in 30 sec without use of UEs which is  Average for patient's age  SHOULDER AROM:   [facility]   CERVICAL AROM:   Percent limited 07/30/24 08/06/24 08/15/24 09/16/2024  Flexion WFL WFL WFL WFL WFL  Extension 25% limited WFL WFL WFL WFL  Right lateral flexion 75% limited with pain 25% limited - tight 50% limited 60% limited 50% limited  Left lateral flexion 75% limited 50% limited 50% limited 60% limited 50% limited  Right rotation 25% limited with pain 25% limited 25% limited 25%  limited 15% limited  Left rotation 25% limited 25% limited 25% limited 25% limited 15% limited    (Blank rows=not tested)  LYMPHEDEMA ASSESSMENT:    Circumference in cm  4 cm superior to sternal notch around neck 43  6 cm superior to sternal notch around neck 43  8 cm superior to sternal notch around neck 44  R lateral nostril from base of nose to medial tragus   L lateral nostril from base of nose to medial tragus   R corner of mouth to where ear lobe meets face   L corner of mouth to where ear lobe meets face         (Blank rows=not tested)  Neck Disability Index score: 10 / 50 = 20.0 %  UPPER EXTREMITY AROM/PROM:  A/PROM RIGHT   08/15/24  RIGHT 09/16/2024  Shoulder extension 65   Shoulder flexion 145 146  Shoulder abduction 152 151  Shoulder internal rotation 61   Shoulder external rotation 63     (Blank rows = not tested)  A/PROM LEFT   08/15/24  Shoulder extension 72  Shoulder flexion 165  Shoulder abduction 172  Shoulder internal rotation 66  Shoulder external rotation 80    (Blank rows = not tested)   TREATMENT PERFORMED:  09/16/2024  Lengthy discussion regarding therapy and what we are doing now is not addressing his pain. Discussed pt scheduling  appt with MD UBE x 5 min, 2:30 forward and back, Manual level 3 Bilateral cervical rotation and SB stretch x 3 ea Supine on half roll: shoulder flexion red x 10, horizontal abduction x 10 red, sword bilaterally x 10 Scapular retraction on Free motion 1 x 10 reps 7 lbs, 10# 1 x 10 reps, lat pulls 7 lbs ea  2 x 10 reps Checked  AROM neck and Right shoulder and assessed goals for DC Discussed again pt seeing family/orthopedic MD for information on where deep pain is coming from; appears to be from shoulder joint area. We have not helped that particular pain and it needs to be checked. Asked pt to contact me after he has seen someone. 09/13/2024  UBE x 5 min, 2:30 forward and back, Manual level 3 Body Blade 2 x 1  min with blade vertical, 2 x 1 min blade in front horizontal 4D ball rolls red x 15 Foam roll on wall with purple ball;flexion, scaption, abd x 6 ea, horizontal abd 2 x 5 red band Wall pushups with a plus x 20 Red band on // bars; isometric walkouts x 8 for ER Scapular retraction on Free motion x 15 reps 7 plates, lat pulls 7 pl ea 1 x 10 09/10/2024  Ball rolls on wall x 10 flex and abduction to warm up Free motion Scapular retraction 2 x 10 emphasis on scapular depression 7# and maintaining core Lat pull downs 7# EA 2 x 10 with scapular depression Standing with back against white foam roller and purple ball behind head while holding chin tuck: 3 way shoulder while focusing on keeping upright posture and chin tuck throughout x 7 reps ea to approx 90 degrees 4 D ball rolls on wall with red x 15 Forward wall slide with lift off; emphasis on scapular depression Seated right scapular PNF with PT resistance to work on isolating scapular depression x 15  08/15/24: Therapeutic Exercise: Pulleys x 2 min in direction of flexion and 2 min in direction of abduction with pt returning therapist demo and v/c to not hike shoulder Ball up wall x 10 reps in direction of flexion and 10 reps in direction of R shoulder abduction with v/c not to hike shoulder Attempted finger ladder but this was not a challenge Therapeutic Activities  Standing with back against white foam roller and purple ball behind head while holding chin tuck: 3 way shoulder while focusing on keeping upright posture and chin tuck throughout Red band tied to // bars: resisted isometric walkouts in to R shoulder flexion, extension, IR and ER x 10 reps each with pt returning therapist demo and v/c to keep UE in neutral Dual Cable Machine: scapular retraction x 15 reps using 7 lbs with pt returning therapist demo and cues to avoid scapular hiking Seated scapular depression 08/13/2024 Lengthy discussion with pt about his concerns for remaining  pain that feels deep ER strength very weak with likely tear (prior RTC repair) Practiced D2 ext in sitting with manual resistance to right scapular region 2 x 10 and focus on posture and form. Tried ER with yellow bandwith towel roll under arm,but difficult and advised to go to neutral only. Also tried SL but unable to do.  Supine ER from IR position and showed how to add light wt as tolerated. Reviewed HEP with scapular retraction and emphasis on scapular depression while performing; able to do 10 + 5 with good form and occasional Vcs for posture. Also did standing shoulder ext with green and emphasis on scapular depression and posture 1 x 10, 1 x 5. Tried standing horizontal abd but used poor form with gn so will continue with red up against the wall. Discussed orthopedic MD if it continues to bother him. Pt felt these modifications/emphasis address where his problem is. He has 1 more visit and then can't come for several weeks. May want DC or set up appt further out for follow up.    08/08/24: Therapeutic Activities  Standing with back against 1/2 foam roller and purple ball behind head while holding chin tuck: scapular strengthening exercises with red band x 10 reps each as follows: narrow and wide grip flexion, ER (on L only), horizontal abduction and diagonals x 10 reps each with pt returning therapist demo Chin tuck with purple ball against wall: 3 way shoulder with no weight x 5 reps each Standing wall angel with purple ball behind head for chin tuck with stick raising overhead with good posture x 10 Dual Cable Machine: scapular retraction x 10 reps using 7 lbs with pt returning therapist demo and cues to avoid scapular hiking, lat pulls with 7lbs x 15 reps with v/c to avoid scapular hiking Manual Therapy STM to R upper traps, rhomboids, levator, posterior and lateral cervical muscles using cocoa butter and WAVE tool in L side lying with numerous areas of muscle tightness noted with  improvement noted by end of session  08/06/24: Therapeutic Activities  Seated isometric cervical flexion, extension, bilateral sidebneding and bilateral rotation all x 5 reps with 5 sec holds with pt returning therapist demo Supine chin tuck with towel under head with 5 sec holds x 10  Standing wall angel with stick raising overhead with good posture x 10 Doorway stretch x 5 reps with 15 sec holds Standing neck retraction against wall x 5 with 5 sec holds with pt returning therapist demo Supine scapular series with red theraband and cervical retraction during (horz abd, bil er, narrow and wide grip, and bil D2) x 10 each returning therapist demo for postural strength and stability; Educated pt to keep R shoulder relaxed and retracted to avoid anterior shoulder discomfort Manual Therapy Manual traction, suboccipital release STM in supine to bil upper traps, bilateral posterior and lateral cervical muscles including SCM PROM in to bilateral rotation and lateral flexion      PATIENT EDUCATION:  Education details: Supine scapular series with red theraband Person educated: Patient Education method: Explanation, Demonstration, VC's for technique, Handout Education comprehension: Patient verbalized understanding and returned demonstration, will benefit from further review  HOME EXERCISE PROGRAM: Patient was instructed today in a home exercise program today for head and neck range of motion exercises. These included neck and shoulder retraction. Patient was encouraged to do these 2-3 times a day, holding for 5 sec each and completing for 5 reps. Pt was educated that once this becomes easier then hold the stretches for 30-60 seconds.  Educated pt to avoid stretches or movements that cause increased pain 08/01/24 - Supine scapular series   Access Code: [facility] [place] URL: [email or web address] Date: 08/15/2024 Prepared by: Florina Lanis Carbon  Exercises - Cervical Retraction at Wall   - 1 x daily - 7 x weekly - 1 sets - 5-10 reps -  5 sec hold - Standing Isometric Cervical Sidebending with Manual Resistance  - 1 x daily - 7 x weekly - 1 sets - 5-10 reps - 5 sec hold - Standing Isometric Cervical Flexion with Manual Resistance  - 1 x daily - 7 x weekly - 1 sets - 5-10 reps - 5 sec hold - Seated Isometric Cervical Extension  - 1 x daily - 7 x weekly - 1 sets - 5-10 reps - 5 sec hold - Seated Isometric Cervical Rotation  - 1 x daily - 7 x weekly - 1 sets - 5-10 reps - 5 sec hold - Wall Angels  - 1 x daily - 7 x weekly - 1 sets - 5-10 reps - 5 sec hold - Supine Cervical Retraction with Towel  - 1 x daily - 7 x weekly - 1 sets - 10 reps - 5 sec hold - Doorway Pec Stretch at 90 Degrees Abduction  - 1 x daily - 7 x weekly - 1 sets - 3 reps - 15-60 sec hold - Shoulder Internal Rotation Reactive Isometrics  - 1 x daily - 7 x weekly - 1 sets - 10 reps - Shoulder External Rotation Reactive Isometrics (Mirrored)  - 1 x daily - 7 x weekly - 1 sets - 10 reps - Shoulder Extension Reactive Isometrics with Elbow Extended  - 1 x daily - 7 x weekly - 1 sets - 10 reps - Standing Shoulder Flexion Reactive Isometrics with Elbow Extended  - 1 x daily - 7 x weekly - 1 sets - 10 reps  ASSESSMENT:  CLINICAL IMPRESSION:  Pt achieved all applicable goals except goals for shoulder ROM. He has improved tremendously with posture and demonstrates good improvement in lateral flexion of the neck after performing stretching exs. He feels he is able to turn now while driving to see behind him. He is independent with his HEP.  He feels excellent improvement in UE strength and in his posture.He will contact a physician to try to learn the cause of the deep pain he is getting that appears to be into the shoulder and which bothers him on a daily basis. Pt will benefit from skilled therapeutic intervention to improve on the following deficits: Decreased knowledge of precautions and postural dysfunction. Other deficits:  decreased knowledge of condition, decreased ROM, decreased strength, increased fascial restrictions, increased muscle spasms, postural dysfunction, and pain  PT treatment/interventions: ADL/self-care home management, pt/family education, therapeutic exercise. Other interventions [id number]- PT Re-evaluation, 97110-Therapeutic exercises, 97530- Therapeutic activity, [id number]- Neuromuscular re-education, 97535- Self Care, [id number]- Manual therapy, 97760- Orthotic Initial, [phone]- Orthotic/Prosthetic subsequent, and Patient/Family education  REHAB POTENTIAL: Good  CLINICAL DECISION MAKING: Stable/uncomplicated  EVALUATION COMPLEXITY: Low   GOALS: Goals reviewed with patient? YES  LONG TERM GOALS: (STG=LTG)   Name Target Date  Goal status  1 Patient will be able to verbalize understanding of a home exercise program for cervical range of motion, posture, and walking.   Baseline:  No knowledge 07/17/2024 Achieved at eval  2 Patient will be able to verbalize understanding of proper sitting and standing posture. Baseline:  No knowledge 07/17/2024 Achieved at eval  3 Patient will be able to verbalize understanding of lymphedema risk and availability of treatment for this condition Baseline:  No knowledge 07/17/2024 Achieved at eval  4 Pt to be able to turn his head to look over his left and right shoulder while driving without increased pain and spasm in his neck.   08/14/24 MET 08/15/24  5 Pt  will be independent in a home exercise program for continued stretching and strengthening to improve posture and decrease neck pain. 08/14/24 MET 09/16/2024    6 Pt will demonstrate improved cervical lateral flexion and will only demonstrate 50% limitation instead of 75% limitation to allow improved motion/function 08/14/24 MET 09/16/2024  7 Pt will demonstrate 165 degrees of R shoulder flexion to allow him to reach overhead.  10/03/24 NOT MET 09/16/2024  8 Pt will demonstrate 165 degrees of R shoulder abduction to  allow him to reach out to the side.  10/03/24 NOT MET 09/16/2024    PLAN:  PT FREQUENCY/DURATION: on hold for 3 weeks and then 2x/wk for 4 wks  PLAN FOR NEXT SESSION: Pt to try to see doctor regarding deep pain in his shoulder since we are not improving that.   PHYSICAL THERAPY DISCHARGE SUMMARY  Visits from Start of Care: 12  Current functional level related to goals / functional outcomes: Partially achieved goals   Remaining deficits: Right shoulder ROM deficits, deep pain  around [facility] joint   Education / Equipment: HEP, theraband   Patient agrees to discharge. Patient goals were partially met. Patient is being discharged due to meeting the stated rehab goals.  Grayce JINNY Sheldon, PT 09/16/2024, 1:44 PM     Over Head Pull: Narrow and Wide Grip   Cancer Rehab [phone]   On back, knees bent, feet flat, band across thighs, elbows straight but relaxed. Pull hands apart (start). Keeping elbows straight, bring arms up and over head, hands toward floor. Keep pull steady on band. Hold momentarily. Return slowly, keeping pull steady, back to start. Then do same with a wider grip on the band (past shoulder width) Repeat _5-10__ times. Band color __red____   Side Pull: Double Arm   On back, knees bent, feet flat. Arms perpendicular to body, shoulder level, elbows straight but relaxed. Pull arms out to sides, elbows straight. Resistance band comes across collarbones, hands toward floor. Hold momentarily. Slowly return to starting position. Repeat _5-10__ times. Band color _red____   Sword   On back, knees bent, feet flat, left hand on left hip, right hand above left. Pull right arm DIAGONALLY (hip to shoulder) across chest. Bring right arm along head toward floor. Hold momentarily. Slowly return to starting position. Repeat _5-10__ times. Do with left arm. Band color _red_____   Shoulder Rotation: Double Arm   On back, knees bent, feet flat, elbows tucked at sides, bent 90,  hands palms up. Pull hands apart and down toward floor, keeping elbows near sides. Hold momentarily. Slowly return to starting position. Repeat _5-10__ times. Band color __red____

## 2024-09-18 ENCOUNTER — Ambulatory Visit

## 2024-09-24 ENCOUNTER — Ambulatory Visit: Admitting: Physical Therapy

## 2024-09-24 ENCOUNTER — Telehealth: Payer: Self-pay

## 2024-09-24 DIAGNOSIS — M12811 Other specific arthropathies, not elsewhere classified, right shoulder: Secondary | ICD-10-CM | POA: Diagnosis not present

## 2024-09-24 DIAGNOSIS — M75101 Unspecified rotator cuff tear or rupture of right shoulder, not specified as traumatic: Secondary | ICD-10-CM | POA: Diagnosis not present

## 2024-09-24 NOTE — Telephone Encounter (Signed)
 Medical pre-operative clearance faxed per Dr Federico, MD as requested from Dr. Bonner Hair, MD.  Fax confirmation rec'd.

## 2024-09-25 ENCOUNTER — Other Ambulatory Visit: Payer: Self-pay | Admitting: Orthopaedic Surgery

## 2024-09-25 DIAGNOSIS — M12811 Other specific arthropathies, not elsewhere classified, right shoulder: Secondary | ICD-10-CM

## 2024-09-25 DIAGNOSIS — H25013 Cortical age-related cataract, bilateral: Secondary | ICD-10-CM | POA: Diagnosis not present

## 2024-09-25 DIAGNOSIS — H1789 Other corneal scars and opacities: Secondary | ICD-10-CM | POA: Diagnosis not present

## 2024-09-25 DIAGNOSIS — H2513 Age-related nuclear cataract, bilateral: Secondary | ICD-10-CM | POA: Diagnosis not present

## 2024-09-26 ENCOUNTER — Ambulatory Visit

## 2024-09-27 ENCOUNTER — Inpatient Hospital Stay: Admission: RE | Admit: 2024-09-27 | Discharge: 2024-09-27 | Attending: Orthopaedic Surgery

## 2024-09-27 DIAGNOSIS — M12811 Other specific arthropathies, not elsewhere classified, right shoulder: Secondary | ICD-10-CM

## 2024-09-27 DIAGNOSIS — M19011 Primary osteoarthritis, right shoulder: Secondary | ICD-10-CM | POA: Diagnosis not present

## 2024-09-30 ENCOUNTER — Telehealth: Payer: Self-pay

## 2024-09-30 NOTE — Telephone Encounter (Signed)
 Pt left message stating [facility] needed letter of clearance for surgery from  Dr DOROTHA Kidney, MD.  Returned call to pt and reported that a letter of clearance was successfully faxed 09/24/24 to office fax number [phone],  indicated on request from [facility].  Attempt has been made to call their office  today yet no answer.  Letter of clearance faxed again today with confirmation of successful fax transmission.  Pt updated and advised to call us  back  ([phone]) if this continues to be a problem. Pt verbalized understanding.

## 2024-10-01 ENCOUNTER — Ambulatory Visit

## 2024-10-03 ENCOUNTER — Ambulatory Visit: Admitting: Physical Therapy

## 2024-10-04 DIAGNOSIS — Z01818 Encounter for other preprocedural examination: Secondary | ICD-10-CM | POA: Diagnosis not present

## 2024-10-04 DIAGNOSIS — M12811 Other specific arthropathies, not elsewhere classified, right shoulder: Secondary | ICD-10-CM | POA: Diagnosis not present

## 2024-11-08 NOTE — Therapy (Signed)
 " OUTPATIENT PHYSICAL THERAPY UPPER EXTREMITY EVALUATION   Patient Name: Geoffrey Duran MRN: [id number] DOB:[birth year], 79 y.o., male Today's Date: 11/11/2024  END OF SESSION:  PT End of Session - 11/11/24 0935     Visit Number 1    Date for Recertification  01/06/25    Authorization Type Medicare    Progress Note Due on Visit 10    PT Start Time 0847    PT Stop Time 0927    PT Time Calculation (min) 40 min    Activity Tolerance Patient tolerated treatment well    Behavior During Therapy [facility] for tasks assessed/performed          Past Medical History:  Diagnosis Date   Arthritis    Cancer (HCC)    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 Jeffrie- follows- [facility] as needed for palpitations   Elevated liver function tests    GERD (gastroesophageal reflux disease)    HOH (hard of hearing)    wears HAs   Hypertension    Pneumonia    Premature atrial contraction    Thrombocytopenia    Past Surgical History:  Procedure Laterality Date   bilateral rotator surgery     COLONOSCOPY WITH PROPOFOL  N/A 03/08/2016   Procedure: COLONOSCOPY WITH PROPOFOL ;  Surgeon: [name], MD;  Location: [facility];  Service: Endoscopy;  Laterality: N/A;   HERNIA REPAIR  1970   right inguinal hernia   LYMPH NODE BIOPSY Right 02/19/2024   Procedure: LYMPH NODE BIOPSY;  Surgeon: [name], MD;  Location: [facility];  Service: General;  Laterality: Right;  Excisional biopsy right posterior cervical lymph nodes   Patient Active Problem List   Diagnosis Date Noted   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   Cervical lymphadenopathy 02/18/2024   Essential hypertension 04/08/2015   Bradycardia 03/30/2015   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   Elevated LFTs 02/10/2014   Pneumonia 02/06/2014   Thrombocytopenia 02/06/2014   High anion gap metabolic acidosis 02/06/2014   Sepsis (HCC) 02/06/2014    PCP: [name], MD  REFERRING  PROVIDER: [name], MD  REFERRING DIAG:  Diagnosis  [phone] (ICD-10-CM) - Presence of left artificial shoulder joint    THERAPY DIAG:  Stiffness of right shoulder, not elsewhere classified - Plan: PT plan of care cert/re-cert  Muscle weakness (generalized) - Plan: PT plan of care cert/re-cert  Acute pain of right shoulder - Plan: PT plan of care cert/re-cert  Abnormal posture - Plan: PT plan of care cert/re-cert  Rationale for Evaluation and Treatment: Rehabilitation  ONSET DATE: reverse Rt total shoulder replacement performed 11/06/24  SUBJECTIVE:  SUBJECTIVE STATEMENT: Pt presents to PT s/p Rt reverse total shoulder replacement  performed on 11/06/24 secondary to OA.  He is wearing sling 100% of the time and icing as needed.    From MD: post reverse total replacement of left shoulder S/P R Rev [name]- 11/06/24 needs to start with you on 11/11/24  Hand dominance: Right  PERTINENT HISTORY: Treated at [facility] for Nodular Lymphocyte Predominant Hodgkin's Lymphoma until 09/16/24, HTN, hard of hearing  PAIN: 11/11/24 Are you having pain? Yes: NPRS scale: 0-4/10 Pain location: Rt shoulder  Pain description: dull  Aggravating factors: moving arm, afternoon/evening  Relieving factors: ice and Tylenol , using sling  PRECAUTIONS: Shoulder and Other: follow post op protocol- scanned into media  RED FLAGS: None   WEIGHT BEARING RESTRICTIONS: Yes- <1 lb for first 4 weeks until 12/04/14  FALLS:  Has patient fallen in last 6 months? No  LIVING ENVIRONMENT: Lives with: lives with their spouse Lives in: House/apartment   OCCUPATION: Desk work-heating and air, farm work   PLOF: Independent and Leisure: farm work   PATIENT GOALS: return for prior level of function of Rt shoulder   NEXT MD VISIT:  11/12/24  OBJECTIVE:  Note: Objective measures were completed at Evaluation unless otherwise noted.  DIAGNOSTIC FINDINGS:  None   PATIENT SURVEYS :  11/11/24: QuickDASH: 72.7% disability   COGNITION: Overall cognitive status: Within functional limits for tasks assessed     SENSATION: WFL  POSTURE: Normal for post-op shoulder surgery in sling   UPPER EXTREMITY ROM:   Passive ROM Right eval Left eval  Shoulder flexion 60 Full   Shoulder extension NT   Shoulder abduction NT   Shoulder adduction Full    Shoulder internal rotation normal   Shoulder external rotation Lacking 5   Elbow flexion Full    Elbow extension Lacking 10 with pain   Wrist flexion Full wrist   Wrist extension    Wrist ulnar deviation    Wrist radial deviation    Wrist pronation    Wrist supination    (Blank rows = not tested)  UPPER EXTREMITY MMT:  MMT Right eval Left eval  Shoulder flexion Not tested due to post-op 4+ to 5/5 throughout   Shoulder extension    Shoulder abduction    Shoulder adduction    Shoulder internal rotation    Shoulder external rotation    Middle trapezius    Lower trapezius    Elbow flexion    Elbow extension    Wrist flexion    Wrist extension    Wrist ulnar deviation    Wrist radial deviation    Wrist pronation    Wrist supination    Grip strength (lbs) 54 80  (Blank rows = not tested)    JOINT MOBILITY TESTING:  Not tested due to post-op  PALPATION:  Incision covered with post op bandage.  Tender to palpation around Rt glenohumeral joint.  Minimal edema  TREATMENT DATE:  11/11/24:  Findings from evaluation discussed, pt educated on plan of care, HEP initiated.     PATIENT EDUCATION: Education details: [email or web address], sling wear and precautions post-op Person educated: Patient Education method: Explanation, Demonstration, and  Handouts Education comprehension: verbalized understanding and returned demonstration  HOME EXERCISE PROGRAM: Access Code: [email or web address] URL: [email or web address] Date: 11/11/2024 Prepared by: Burnard  Exercises - Supine Elbow Extension Stretch in Supination  - 3 x daily - 7 x weekly - 1 sets - 10 reps - 10 hold - Supine Elbow Flexion Extension AROM  - 3 x daily - 7 x weekly - 1 sets - 10 reps - 10 hold - Supine Shoulder External Rotation with Dowel  - 3 x daily - 7 x weekly - 2 sets - 10 reps - Supine Shoulder Flexion Overhead with Dowel  - 3 x daily - 7 x weekly - 1 sets - 5-10 reps - Seated Gripping Towel  - 3 x daily - 7 x weekly - 1 sets - 10 reps  ASSESSMENT:  CLINICAL IMPRESSION: Patient is a 79 y.o. male who was seen today for physical therapy evaluation and treatment for s/p Rt reverse total shoulder replacement performed on 11/06/24.  Pt reports that he had Rt RTC repair surgery years ago.  Pt has been wearing pillow sling and icing regularly.  Pt with limited Rt shoulder P/ROM consistent with post-op shoulder replacement and surgical incision is covered with post-op bandaging. Rt grip is reduced vs Lt.  PT instructed pt in initial HEP allowed per protocol to include grip, elbow flexion and extension AROM. Patient will benefit from skilled PT to address the below impairments and improve overall function.    OBJECTIVE IMPAIRMENTS: decreased activity tolerance, decreased endurance, decreased ROM, decreased strength, increased edema, increased fascial restrictions, increased muscle spasms, impaired flexibility, impaired UE functional use, postural dysfunction, and pain.   ACTIVITY LIMITATIONS: carrying, lifting, bathing, toileting, dressing, self feeding, reach over head, and hygiene/grooming  PARTICIPATION LIMITATIONS: meal prep, cleaning, laundry, driving, shopping, and yard work  PERSONAL FACTORS: Age, Time since onset of injury/illness/exacerbation, and 1-2  comorbidities: prior shoulder replacement, cancer are also affecting patient's functional outcome.   REHAB POTENTIAL: Good  CLINICAL DECISION MAKING: Stable/uncomplicated  EVALUATION COMPLEXITY: Moderate  GOALS: Goals reviewed with patient? Yes  SHORT TERM GOALS: Target date: 12/09/2024    Be independent in initial HEP Baseline: Goal status: INITIAL  2.  Improve Rt grip to > or = to 65# to improve use of Rt UE for ADLs Baseline: 54# Goal status: INITIAL  3.  Wean from sling and report use of Rt UE for light tasks including eating and hygiene (after 12/04/24) Baseline:  Goal status: INITIAL  4.  Demonstrate Rt shoulder A/AROM flexion and scaption to 90 degrees to improve shoulder mobility Baseline:  Goal status: INITIAL    LONG TERM GOALS: Target date: 01/06/2025    Be independent in advanced HEP Baseline:  Goal status: INITIAL  2.  Improve QuickDASH to < or = to 50% to improve functional use  Baseline: 72.7%  Goal status: INITIAL  3.  Demonstrate Rt shoulder A/ROM flexion to > or = to 95 degrees to improve reaching overhead  Baseline: 60 degrees P/ROM Goal status: INITIAL  4.  demo > or = Rt shoulder A/ROM ER to occiput  to improve grooming  Baseline:  Goal status: INITIAL  5.  Report >or = to 60% use of Rt UE with ADLs and self-care due to improve A/ROM and strength  of Rt UE Baseline:  Goal status: INITIAL  6.  Report < or = to 4/10 Rt shoulder pain with use with ADLs and self-care Baseline:  Goal status: INITIAL  PLAN: PT FREQUENCY: 2x/week  PT DURATION: 8 weeks  PLANNED INTERVENTIONS: 97110-Therapeutic exercises, 97530- Therapeutic activity, 97112- Neuromuscular re-education, 97535- Self Care, 02859- Manual therapy, [phone]- Canalith repositioning, [id number]- Aquatic Therapy, G0283- Electrical stimulation (unattended), 97016- Vasopneumatic device, 20560 (1-2 muscles), 20561 (3+ muscles)- Dry Needling, Patient/Family education, Joint mobilization, Scar  mobilization, Vestibular training, Cryotherapy, and Moist heat  PLAN FOR NEXT SESSION: follow post-op protocol, P/ROM within protocol limits   Burnard Joy, PT 11/11/24 9:54 AM   [facility] [address], [address] [place], [place] [address] Phone # [phone] Fax [phone]  "

## 2024-11-11 ENCOUNTER — Ambulatory Visit: Attending: Orthopaedic Surgery

## 2024-11-11 ENCOUNTER — Other Ambulatory Visit: Payer: Self-pay

## 2024-11-11 DIAGNOSIS — R293 Abnormal posture: Secondary | ICD-10-CM | POA: Diagnosis not present

## 2024-11-11 DIAGNOSIS — M6281 Muscle weakness (generalized): Secondary | ICD-10-CM | POA: Insufficient documentation

## 2024-11-11 DIAGNOSIS — Z96612 Presence of left artificial shoulder joint: Secondary | ICD-10-CM | POA: Diagnosis not present

## 2024-11-11 DIAGNOSIS — M25611 Stiffness of right shoulder, not elsewhere classified: Secondary | ICD-10-CM | POA: Diagnosis present

## 2024-11-11 DIAGNOSIS — M25511 Pain in right shoulder: Secondary | ICD-10-CM | POA: Insufficient documentation

## 2024-11-14 ENCOUNTER — Ambulatory Visit

## 2024-11-14 DIAGNOSIS — M6281 Muscle weakness (generalized): Secondary | ICD-10-CM

## 2024-11-14 DIAGNOSIS — M25611 Stiffness of right shoulder, not elsewhere classified: Secondary | ICD-10-CM

## 2024-11-14 DIAGNOSIS — M25511 Pain in right shoulder: Secondary | ICD-10-CM | POA: Diagnosis not present

## 2024-11-14 DIAGNOSIS — R293 Abnormal posture: Secondary | ICD-10-CM

## 2024-11-14 NOTE — Therapy (Signed)
 " OUTPATIENT PHYSICAL THERAPY UPPER EXTREMITY EVALUATION   Patient Name: Geoffrey Duran MRN: [id number] DOB:[birth year], 79 y.o., male Today's Date: 11/14/2024  END OF SESSION:  PT End of Session - 11/14/24 0843     Visit Number 2    Date for Recertification  01/06/25    Authorization Type Medicare    Progress Note Due on Visit 10    PT Start Time 0846    PT Stop Time 0926    PT Time Calculation (min) 40 min    Activity Tolerance Patient tolerated treatment well    Behavior During Therapy [facility] for tasks assessed/performed           Past Medical History:  Diagnosis Date   Arthritis    Cancer (HCC)    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 Jeffrie- follows- [facility] as needed for palpitations   Elevated liver function tests    GERD (gastroesophageal reflux disease)    HOH (hard of hearing)    wears HAs   Hypertension    Pneumonia    Premature atrial contraction    Thrombocytopenia    Past Surgical History:  Procedure Laterality Date   bilateral rotator surgery     COLONOSCOPY WITH PROPOFOL  N/A 03/08/2016   Procedure: COLONOSCOPY WITH PROPOFOL ;  Surgeon: [name], MD;  Location: [facility];  Service: Endoscopy;  Laterality: N/A;   HERNIA REPAIR  1970   right inguinal hernia   LYMPH NODE BIOPSY Right 02/19/2024   Procedure: LYMPH NODE BIOPSY;  Surgeon: [name], MD;  Location: [facility];  Service: General;  Laterality: Right;  Excisional biopsy right posterior cervical lymph nodes   Patient Active Problem List   Diagnosis Date Noted   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   Cervical lymphadenopathy 02/18/2024   Essential hypertension 04/08/2015   Bradycardia 03/30/2015   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   Elevated LFTs 02/10/2014   Pneumonia 02/06/2014   Thrombocytopenia 02/06/2014   High anion gap metabolic acidosis 02/06/2014   Sepsis (HCC) 02/06/2014    PCP: [name], MD  REFERRING  PROVIDER: [name], MD  REFERRING DIAG:  Diagnosis  [phone] (ICD-10-CM) - Presence of left artificial shoulder joint    THERAPY DIAG:  Stiffness of right shoulder, not elsewhere classified  Muscle weakness (generalized)  Acute pain of right shoulder  Abnormal posture  Rationale for Evaluation and Treatment: Rehabilitation  ONSET DATE: reverse Rt total shoulder replacement performed 11/06/24  SUBJECTIVE:  SUBJECTIVE STATEMENT: I've been doing my exercises. They are a challenge.   From MD: post reverse total replacement of left shoulder S/P R Rev [name]- 11/06/24 needs to start with you on 11/11/24  Hand dominance: Right  PERTINENT HISTORY: Treated at [facility] for Nodular Lymphocyte Predominant Hodgkin's Lymphoma until 09/16/24, HTN, hard of hearing  PAIN: 11/14/24 Are you having pain? Yes: NPRS scale: 0-4/10 Pain location: Rt shoulder  Pain description: dull  Aggravating factors: moving arm, afternoon/evening  Relieving factors: ice and Tylenol , using sling  PRECAUTIONS: Shoulder and Other: follow post op protocol- scanned into media  RED FLAGS: None   WEIGHT BEARING RESTRICTIONS: Yes- <1 lb for first 4 weeks until 12/04/14  FALLS:  Has patient fallen in last 6 months? No  LIVING ENVIRONMENT: Lives with: lives with their spouse Lives in: House/apartment   OCCUPATION: Desk work-heating and air, farm work   PLOF: Independent and Leisure: farm work   PATIENT GOALS: return for prior level of function of Rt shoulder   NEXT MD VISIT: 11/12/24  OBJECTIVE:  Note: Objective measures were completed at Evaluation unless otherwise noted.  DIAGNOSTIC FINDINGS:  None   PATIENT SURVEYS :  11/11/24: QuickDASH: 72.7% disability   COGNITION: Overall cognitive status: Within functional  limits for tasks assessed     SENSATION: WFL  POSTURE: Normal for post-op shoulder surgery in sling   UPPER EXTREMITY ROM:   Passive ROM Right eval Left eval  Shoulder flexion 60 Full   Shoulder extension NT   Shoulder abduction NT   Shoulder adduction Full    Shoulder internal rotation normal   Shoulder external rotation Lacking 5   Elbow flexion Full    Elbow extension Lacking 10 with pain   Wrist flexion Full wrist   Wrist extension    Wrist ulnar deviation    Wrist radial deviation    Wrist pronation    Wrist supination    (Blank rows = not tested)  UPPER EXTREMITY MMT:  MMT Right eval Left eval  Shoulder flexion Not tested due to post-op 4+ to 5/5 throughout   Shoulder extension    Shoulder abduction    Shoulder adduction    Shoulder internal rotation    Shoulder external rotation    Middle trapezius    Lower trapezius    Elbow flexion    Elbow extension    Wrist flexion    Wrist extension    Wrist ulnar deviation    Wrist radial deviation    Wrist pronation    Wrist supination    Grip strength (lbs) 54 80  (Blank rows = not tested)    JOINT MOBILITY TESTING:  Not tested due to post-op  PALPATION:  Incision covered with post op bandage.  Tender to palpation around Rt glenohumeral joint.  Minimal edema  TREATMENT DATE:  11/14/24:  Seated table slides into flexion to 90 degrees x10 with 10 second hold Digiflex: red x3 min  Supine: elbow flexion and extension x10 each  ER and flexion with dowel: 5 hold x10- good quality of movement Manual: P/ROM into ER, flexion and scaption to tolerance and within protocol limits   11/11/24:  Findings from evaluation discussed, pt educated on plan of care, HEP initiated.     PATIENT EDUCATION: Education details: [email or web address], sling wear and precautions post-op Person educated:  Patient Education method: Explanation, Demonstration, and Handouts Education comprehension: verbalized understanding and returned demonstration  HOME EXERCISE PROGRAM: Access Code: [email or web address] URL: [email or web address] Date: 11/11/2024 Prepared by: Burnard  Exercises - Supine Elbow Extension Stretch in Supination  - 3 x daily - 7 x weekly - 1 sets - 10 reps - 10 hold - Supine Elbow Flexion Extension AROM  - 3 x daily - 7 x weekly - 1 sets - 10 reps - 10 hold - Supine Shoulder External Rotation with Dowel  - 3 x daily - 7 x weekly - 2 sets - 10 reps - Supine Shoulder Flexion Overhead with Dowel  - 3 x daily - 7 x weekly - 1 sets - 5-10 reps - Seated Gripping Towel  - 3 x daily - 7 x weekly - 1 sets - 10 reps  ASSESSMENT:  CLINICAL IMPRESSION: first time follow-up after evaluation.  He is compliant with HEP and is challenged with initial mobility.  PT monitored throughout session for pain, technique and fatigue.  Pt is limited by protocol at this time.  Quality of movement with A/AROM has improved since first visit.   Patient will benefit from skilled PT to address the below impairments and improve overall function.    OBJECTIVE IMPAIRMENTS: decreased activity tolerance, decreased endurance, decreased ROM, decreased strength, increased edema, increased fascial restrictions, increased muscle spasms, impaired flexibility, impaired UE functional use, postural dysfunction, and pain.   ACTIVITY LIMITATIONS: carrying, lifting, bathing, toileting, dressing, self feeding, reach over head, and hygiene/grooming  PARTICIPATION LIMITATIONS: meal prep, cleaning, laundry, driving, shopping, and yard work  PERSONAL FACTORS: Age, Time since onset of injury/illness/exacerbation, and 1-2 comorbidities: prior shoulder replacement, cancer are also affecting patient's functional outcome.   REHAB POTENTIAL: Good  CLINICAL DECISION MAKING: Stable/uncomplicated  EVALUATION COMPLEXITY:  Moderate  GOALS: Goals reviewed with patient? Yes  SHORT TERM GOALS: Target date: 12/09/2024    Be independent in initial HEP Baseline: Goal status: INITIAL  2.  Improve Rt grip to > or = to 65# to improve use of Rt UE for ADLs Baseline: 54# Goal status: INITIAL  3.  Wean from sling and report use of Rt UE for light tasks including eating and hygiene (after 12/04/24) Baseline:  Goal status: INITIAL  4.  Demonstrate Rt shoulder A/AROM flexion and scaption to 90 degrees to improve shoulder mobility Baseline:  Goal status: INITIAL    LONG TERM GOALS: Target date: 01/06/2025    Be independent in advanced HEP Baseline:  Goal status: INITIAL  2.  Improve QuickDASH to < or = to 50% to improve functional use  Baseline: 72.7%  Goal status: INITIAL  3.  Demonstrate Rt shoulder A/ROM flexion to > or = to 95 degrees to improve reaching overhead  Baseline: 60 degrees P/ROM Goal status: INITIAL  4.  demo > or = Rt shoulder A/ROM ER to occiput  to improve grooming  Baseline:  Goal status: INITIAL  5.  Report >or = to 60% use of  Rt UE with ADLs and self-care due to improve A/ROM and strength of Rt UE Baseline:  Goal status: INITIAL  6.  Report < or = to 4/10 Rt shoulder pain with use with ADLs and self-care Baseline:  Goal status: INITIAL  PLAN: PT FREQUENCY: 2x/week  PT DURATION: 8 weeks  PLANNED INTERVENTIONS: 97110-Therapeutic exercises, 97530- Therapeutic activity, 97112- Neuromuscular re-education, 97535- Self Care, 02859- Manual therapy, [phone]- Canalith repositioning, [id number]- Aquatic Therapy, G0283- Electrical stimulation (unattended), 97016- Vasopneumatic device, 20560 (1-2 muscles), 20561 (3+ muscles)- Dry Needling, Patient/Family education, Joint mobilization, Scar mobilization, Vestibular training, Cryotherapy, and Moist heat  PLAN FOR NEXT SESSION: follow post-op protocol, P/ROM within protocol limits   Burnard Joy, PT 11/14/24 9:30 AM   [facility] [address] [place], [place] [address] Phone # [phone] Fax [phone]  "

## 2024-11-19 ENCOUNTER — Ambulatory Visit

## 2024-11-21 ENCOUNTER — Ambulatory Visit

## 2024-11-21 DIAGNOSIS — M6281 Muscle weakness (generalized): Secondary | ICD-10-CM

## 2024-11-21 DIAGNOSIS — M25611 Stiffness of right shoulder, not elsewhere classified: Secondary | ICD-10-CM

## 2024-11-21 DIAGNOSIS — M25511 Pain in right shoulder: Secondary | ICD-10-CM | POA: Diagnosis not present

## 2024-11-21 DIAGNOSIS — R293 Abnormal posture: Secondary | ICD-10-CM

## 2024-11-21 NOTE — Therapy (Signed)
 " OUTPATIENT PHYSICAL THERAPY UPPER EXTREMITY EVALUATION   Patient Name: Geoffrey Duran MRN: [id number] DOB:06-Dec-1945, 79 y.o., male Today's Date: 11/21/2024  END OF SESSION:  PT End of Session - 11/21/24 0933     Visit Number 3    Date for Recertification  01/06/25    Authorization Type Medicare    Progress Note Due on Visit 10    PT Start Time 0847    PT Stop Time 0925    PT Time Calculation (min) 38 min    Activity Tolerance Patient tolerated treatment well    Behavior During Therapy [facility] for tasks assessed/performed            Past Medical History:  Diagnosis Date   Arthritis    Cancer (HCC)    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 Jeffrie- follows- [facility] as needed for palpitations   Elevated liver function tests    GERD (gastroesophageal reflux disease)    HOH (hard of hearing)    wears HAs   Hypertension    Pneumonia    Premature atrial contraction    Thrombocytopenia    Past Surgical History:  Procedure Laterality Date   bilateral rotator surgery     COLONOSCOPY WITH PROPOFOL  N/A 03/08/2016   Procedure: COLONOSCOPY WITH PROPOFOL ;  Surgeon: [name], MD;  Location: [facility];  Service: Endoscopy;  Laterality: N/A;   HERNIA REPAIR  1970   right inguinal hernia   LYMPH NODE BIOPSY Right 02/19/2024   Procedure: LYMPH NODE BIOPSY;  Surgeon: [name], MD;  Location: [facility];  Service: General;  Laterality: Right;  Excisional biopsy right posterior cervical lymph nodes   Patient Active Problem List   Diagnosis Date Noted   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   Cervical lymphadenopathy 02/18/2024   Essential hypertension 04/08/2015   Bradycardia 03/30/2015   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   Elevated LFTs 02/10/2014   Pneumonia 02/06/2014   Thrombocytopenia 02/06/2014   High anion gap metabolic acidosis 02/06/2014   Sepsis (HCC) 02/06/2014    PCP: [name], MD  REFERRING  PROVIDER: [name], MD  REFERRING DIAG:  Diagnosis  [phone] (ICD-10-CM) - Presence of left artificial shoulder joint    THERAPY DIAG:  Stiffness of right shoulder, not elsewhere classified  Muscle weakness (generalized)  Acute pain of right shoulder  Abnormal posture  Rationale for Evaluation and Treatment: Rehabilitation  ONSET DATE: reverse Rt total shoulder replacement performed 11/06/24  SUBJECTIVE:  SUBJECTIVE STATEMENT: I've been working my shoulder at home.  Doing well  From MD: post reverse total replacement of left shoulder S/P R Rev [facility]SR [name]- 11/06/24 needs to start with you on 11/11/24  Hand dominance: Right  PERTINENT HISTORY: Treated at [facility] for Nodular Lymphocyte Predominant Hodgkin's Lymphoma until 09/16/24, HTN, hard of hearing  PAIN: 11/21/24 Are you having pain? Yes: NPRS scale: 0-6/10 Pain location: Rt shoulder  Pain description: dull  Aggravating factors: moving arm, afternoon/evening  Relieving factors: ice and Tylenol , using sling  PRECAUTIONS: Shoulder and Other: follow post op protocol- scanned into media  RED FLAGS: None   WEIGHT BEARING RESTRICTIONS: Yes- <1 lb for first 4 weeks until 12/04/14  FALLS:  Has patient fallen in last 6 months? No  LIVING ENVIRONMENT: Lives with: lives with their spouse Lives in: House/apartment   OCCUPATION: Desk work-heating and air, farm work   PLOF: Independent and Leisure: farm work   PATIENT GOALS: return for prior level of function of Rt shoulder   NEXT MD VISIT: 11/12/24  OBJECTIVE:  Note: Objective measures were completed at Evaluation unless otherwise noted.  DIAGNOSTIC FINDINGS:  None   PATIENT SURVEYS :  11/11/24: QuickDASH: 72.7% disability   COGNITION: Overall cognitive status: Within functional  limits for tasks assessed     SENSATION: WFL  POSTURE: Normal for post-op shoulder surgery in sling   UPPER EXTREMITY ROM:   Passive ROM Right eval Right  11/21/24 Left eval  Shoulder flexion 60 90 Full   Shoulder extension NT    Shoulder abduction NT    Shoulder adduction Full     Shoulder internal rotation normal    Shoulder external rotation Lacking 5 12   Elbow flexion Full     Elbow extension Lacking 10 with pain Full    Wrist flexion Full wrist    Wrist extension     Wrist ulnar deviation     Wrist radial deviation     Wrist pronation     Wrist supination     (Blank rows = not tested)  UPPER EXTREMITY MMT:  MMT Right eval Left eval  Shoulder flexion Not tested due to post-op 4+ to 5/5 throughout   Shoulder extension    Shoulder abduction    Shoulder adduction    Shoulder internal rotation    Shoulder external rotation    Middle trapezius    Lower trapezius    Elbow flexion    Elbow extension    Wrist flexion    Wrist extension    Wrist ulnar deviation    Wrist radial deviation    Wrist pronation    Wrist supination    Grip strength (lbs) 54 80  (Blank rows = not tested)    JOINT MOBILITY TESTING:  Not tested due to post-op  PALPATION:  Incision covered with post op bandage.  Tender to palpation around Rt glenohumeral joint.  Minimal edema  TREATMENT DATE:  11/21/24:  Seated table slides into flexion to 90 degrees x10 with 10 second hold Digiflex: green x3 min mass grip, 2 min individual fingers  Supine: elbow flexion and extension x10 each  Isometric elbow flexion and extension 5 hold x10 ER and flexion with dowel: 5 hold x10- good quality of movement Manual: P/ROM into ER, flexion and scaption to tolerance and within protocol limits   11/14/24:  Seated table slides into flexion to 90 degrees x10 with 10 second  hold Digiflex: red x3 min  Supine: elbow flexion and extension x10 each  ER and flexion with dowel: 5 hold x10- good quality of movement Manual: P/ROM into ER, flexion and scaption to tolerance and within protocol limits   11/11/24:  Findings from evaluation discussed, pt educated on plan of care, HEP initiated.     PATIENT EDUCATION: Education details: [email or web address], sling wear and precautions post-op Person educated: Patient Education method: Explanation, Demonstration, and Handouts Education comprehension: verbalized understanding and returned demonstration  HOME EXERCISE PROGRAM: Access Code: [email or web address] URL: [email or web address] Date: 11/21/2024 Prepared by: Burnard  Exercises - Supine Elbow Extension Stretch in Supination  - 3 x daily - 7 x weekly - 1 sets - 10 reps - 10 hold - Supine Elbow Flexion Extension AROM  - 3 x daily - 7 x weekly - 1 sets - 10 reps - 10 hold - Supine Shoulder External Rotation with Dowel  - 3 x daily - 7 x weekly - 2 sets - 10 reps - Supine Shoulder Flexion Overhead with Dowel  - 3 x daily - 7 x weekly - 1 sets - 5-10 reps - Seated Gripping Towel  - 3 x daily - 7 x weekly - 1 sets - 10 reps - Seated Isometric Elbow Flexion  - 2 x daily - 7 x weekly - 2 sets - 10 reps - 5 hold - Seated Isometric Elbow Extension  - 1 x daily - 7 x weekly - 2 sets - 10 reps - 5 hold ASSESSMENT:  CLINICAL IMPRESSION: Pt has been consistent with HEP at home. Mobility is significantly improved today and he was able to get to protocol limits of his P/ROM with ease.  PT monitored throughout session for pain, technique and fatigue.  Pt is limited by protocol at this time.  PT monitored throughout session for technique and pain.  Patient will benefit from skilled PT to address the below impairments and improve overall function.    OBJECTIVE IMPAIRMENTS: decreased activity tolerance, decreased endurance, decreased ROM, decreased strength, increased edema, increased  fascial restrictions, increased muscle spasms, impaired flexibility, impaired UE functional use, postural dysfunction, and pain.   ACTIVITY LIMITATIONS: carrying, lifting, bathing, toileting, dressing, self feeding, reach over head, and hygiene/grooming  PARTICIPATION LIMITATIONS: meal prep, cleaning, laundry, driving, shopping, and yard work  PERSONAL FACTORS: Age, Time since onset of injury/illness/exacerbation, and 1-2 comorbidities: prior shoulder replacement, cancer are also affecting patient's functional outcome.   REHAB POTENTIAL: Good  CLINICAL DECISION MAKING: Stable/uncomplicated  EVALUATION COMPLEXITY: Moderate  GOALS: Goals reviewed with patient? Yes  SHORT TERM GOALS: Target date: 12/09/2024    Be independent in initial HEP Baseline:11/21/24 Goal status: MET  2.  Improve Rt grip to > or = to 65# to improve use of Rt UE for ADLs Baseline: 54# Goal status: INITIAL  3.  Wean from sling and report use of Rt UE for light tasks including eating and hygiene (after 12/04/24) Baseline:  Goal status: INITIAL  4.  Demonstrate Rt  shoulder A/AROM flexion and scaption to 90 degrees to improve shoulder mobility Baseline: 11/21/24 Goal status: MET    LONG TERM GOALS: Target date: 01/06/2025    Be independent in advanced HEP Baseline:  Goal status: INITIAL  2.  Improve QuickDASH to < or = to 50% to improve functional use  Baseline: 72.7%  Goal status: INITIAL  3.  Demonstrate Rt shoulder A/ROM flexion to > or = to 95 degrees to improve reaching overhead  Baseline: 60 degrees P/ROM Goal status: INITIAL  4.  demo > or = Rt shoulder A/ROM ER to occiput  to improve grooming  Baseline:  Goal status: INITIAL  5.  Report >or = to 60% use of Rt UE with ADLs and self-care due to improve A/ROM and strength of Rt UE Baseline:  Goal status: INITIAL  6.  Report < or = to 4/10 Rt shoulder pain with use with ADLs and self-care Baseline:  Goal status: INITIAL  PLAN: PT  FREQUENCY: 2x/week  PT DURATION: 8 weeks  PLANNED INTERVENTIONS: 97110-Therapeutic exercises, 97530- Therapeutic activity, 97112- Neuromuscular re-education, 97535- Self Care, 02859- Manual therapy, [phone]- Canalith repositioning, [id number]- Aquatic Therapy, [id number]- Electrical stimulation (unattended), 97016- Vasopneumatic device, 20560 (1-2 muscles), 20561 (3+ muscles)- Dry Needling, Patient/Family education, Joint mobilization, Scar mobilization, Vestibular training, Cryotherapy, and Moist heat  PLAN FOR NEXT SESSION: follow post-op protocol, P/ROM within protocol limits, test grip strength    Burnard Joy, PT 11/21/24 9:36 AM   [facility] [address], [address] [place], [place] [address] Phone # [phone] Fax [phone]  "

## 2024-11-26 ENCOUNTER — Ambulatory Visit

## 2024-11-26 ENCOUNTER — Ambulatory Visit: Admitting: Physical Therapy

## 2024-11-26 ENCOUNTER — Encounter: Payer: Self-pay | Admitting: Physical Therapy

## 2024-11-26 DIAGNOSIS — M25611 Stiffness of right shoulder, not elsewhere classified: Secondary | ICD-10-CM

## 2024-11-26 DIAGNOSIS — M25511 Pain in right shoulder: Secondary | ICD-10-CM

## 2024-11-26 DIAGNOSIS — M6281 Muscle weakness (generalized): Secondary | ICD-10-CM

## 2024-11-28 ENCOUNTER — Ambulatory Visit

## 2024-11-28 DIAGNOSIS — R293 Abnormal posture: Secondary | ICD-10-CM

## 2024-11-28 DIAGNOSIS — M6281 Muscle weakness (generalized): Secondary | ICD-10-CM

## 2024-11-28 DIAGNOSIS — M25611 Stiffness of right shoulder, not elsewhere classified: Secondary | ICD-10-CM

## 2024-11-28 DIAGNOSIS — M25511 Pain in right shoulder: Secondary | ICD-10-CM

## 2024-11-28 NOTE — Therapy (Signed)
 " OUTPATIENT PHYSICAL THERAPY UPPER EXTREMITY PROGRESS NOTE   Patient Name: Geoffrey Duran MRN: [id number] DOB:10/21/1946, 79 y.o., male Today's Date: 11/28/2024  END OF SESSION:  PT End of Session - 11/28/24 0928     Visit Number 5    Date for Recertification  01/06/25    Authorization Type Medicare    Progress Note Due on Visit 10    PT Start Time 0847    PT Stop Time 0926    PT Time Calculation (min) 39 min    Activity Tolerance Patient tolerated treatment well    Behavior During Therapy [facility] for tasks assessed/performed             Past Medical History:  Diagnosis Date   Arthritis    Cancer (HCC)    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 Jeffrie- follows- [facility] as needed for palpitations   Elevated liver function tests    GERD (gastroesophageal reflux disease)    HOH (hard of hearing)    wears HAs   Hypertension    Pneumonia    Premature atrial contraction    Thrombocytopenia    Past Surgical History:  Procedure Laterality Date   bilateral rotator surgery     COLONOSCOPY WITH PROPOFOL  N/A 03/08/2016   Procedure: COLONOSCOPY WITH PROPOFOL ;  Surgeon: [name], MD;  Location: [facility];  Service: Endoscopy;  Laterality: N/A;   HERNIA REPAIR  1970   right inguinal hernia   LYMPH NODE BIOPSY Right 02/19/2024   Procedure: LYMPH NODE BIOPSY;  Surgeon: [name], MD;  Location: [facility];  Service: General;  Laterality: Right;  Excisional biopsy right posterior cervical lymph nodes   Patient Active Problem List   Diagnosis Date Noted   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   Cervical lymphadenopathy 02/18/2024   Essential hypertension 04/08/2015   Bradycardia 03/30/2015   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   Elevated LFTs 02/10/2014   Pneumonia 02/06/2014   Thrombocytopenia 02/06/2014   High anion gap metabolic acidosis 02/06/2014   Sepsis (HCC) 02/06/2014    PCP: [name],  MD  REFERRING PROVIDER: [name], MD  REFERRING DIAG:  Diagnosis  [phone] (ICD-10-CM) - Presence of left artificial shoulder joint    THERAPY DIAG:  Stiffness of right shoulder, not elsewhere classified  Muscle weakness (generalized)  Acute pain of right shoulder  Abnormal posture  Rationale for Evaluation and Treatment: Rehabilitation  ONSET DATE: reverse Rt total shoulder replacement performed 11/06/24  SUBJECTIVE:  SUBJECTIVE STATEMENT: I feel like I'm coming along.    From MD: post reverse total replacement of left shoulder S/P R Rev [facility] [name]- 11/06/24 needs to start with you on 11/11/24  Hand dominance: Right  PERTINENT HISTORY: Treated at [facility] for Nodular Lymphocyte Predominant Hodgkin's Lymphoma until 09/16/24, HTN, hard of hearing  PAIN: 2/3 /26 Are you having pain? Yes: NPRS scale: 1/10 Pain location: Rt shoulder  Pain description: dull  Aggravating factors: moving arm, afternoon/evening  Relieving factors: ice and Tylenol , using sling  PRECAUTIONS: Shoulder and Other: follow post op protocol- scanned into media  RED FLAGS: None   WEIGHT BEARING RESTRICTIONS: Yes- <1 lb for first 4 weeks until 12/04/14  FALLS:  Has patient fallen in last 6 months? No  LIVING ENVIRONMENT: Lives with: lives with their spouse Lives in: House/apartment   OCCUPATION: Desk work-heating and air, farm work   PLOF: Independent and Leisure: farm work   PATIENT GOALS: return for prior level of function of Rt shoulder   NEXT MD VISIT: 11/12/24  OBJECTIVE:  Note: Objective measures were completed at Evaluation unless otherwise noted.  DIAGNOSTIC FINDINGS:  None   PATIENT SURVEYS :  11/11/24: QuickDASH: 72.7% disability   COGNITION: Overall cognitive status: Within functional limits  for tasks assessed     SENSATION: WFL  POSTURE: Normal for post-op shoulder surgery in sling   UPPER EXTREMITY ROM:   Passive ROM Right eval Right  11/21/24 Left eval  Shoulder flexion 60 90 Full   Shoulder extension NT    Shoulder abduction NT    Shoulder adduction Full     Shoulder internal rotation normal    Shoulder external rotation Lacking 5 12   Elbow flexion Full     Elbow extension Lacking 10 with pain Full    Wrist flexion Full wrist    Wrist extension     Wrist ulnar deviation     Wrist radial deviation     Wrist pronation     Wrist supination     (Blank rows = not tested)  UPPER EXTREMITY MMT:  MMT Right eval Right  11/28/24 Left eval  Shoulder flexion Not tested due to post-op  4+ to 5/5 throughout   Shoulder extension     Shoulder abduction     Shoulder adduction     Shoulder internal rotation     Shoulder external rotation     Middle trapezius     Lower trapezius     Elbow flexion     Elbow extension     Wrist flexion     Wrist extension     Wrist ulnar deviation     Wrist radial deviation     Wrist pronation     Wrist supination     Grip strength (lbs) 54 66 80  (Blank rows = not tested)    JOINT MOBILITY TESTING:  Not tested due to post-op  PALPATION:  Incision covered with post op bandage.  Tender to palpation around Rt glenohumeral joint.  Minimal edema  TREATMENT DATE:  11/28/24:   Manual: P/ROM into ER, flexion and scaption to tolerance and within protocol limits Review of supine cane ex's  Twists with red theraband flex bar x20 each direction Overhead pulleys: flexion to 90 x 3 min Seated UE Ranger flexion 20x to 90, stir the pot x20 each direction Digiflex: green x3 min mass grip, 2 min individual fingers  Isometric elbow flexion and extension 5 hold x10    11/26/24:  Discussion on protocol progression  and expectations on ROM recovery and reasons for restrictions  Manual: P/ROM into ER, flexion and scaption to tolerance and within protocol limits Review of supine cane ex's  Seated table slides into flexion to 90 degrees x10 with 10 second hold Seated UE Ranger flexion 20x Digiflex: green x3 min mass grip, 2 min individual fingers  Isometric elbow flexion and extension 5 hold x10 Seated elbow flexion/extension 15x   11/21/24:  Seated table slides into flexion to 90 degrees x10 with 10 second hold Digiflex: green x3 min mass grip, 2 min individual fingers  Supine: elbow flexion and extension x10 each  Isometric elbow flexion and extension 5 hold x10 ER and flexion with dowel: 5 hold x10- good quality of movement Manual: P/ROM into ER, flexion and scaption to tolerance and within protocol limits   PATIENT EDUCATION: Education details: [email or web address], sling wear and precautions post-op Person educated: Patient Education method: Explanation, Demonstration, and Handouts Education comprehension: verbalized understanding and returned demonstration  HOME EXERCISE PROGRAM: Access Code: [email or web address] URL: [email or web address] Date: 11/21/2024 Prepared by: Burnard  Exercises - Supine Elbow Extension Stretch in Supination  - 3 x daily - 7 x weekly - 1 sets - 10 reps - 10 hold - Supine Elbow Flexion Extension AROM  - 3 x daily - 7 x weekly - 1 sets - 10 reps - 10 hold - Supine Shoulder External Rotation with Dowel  - 3 x daily - 7 x weekly - 2 sets - 10 reps - Supine Shoulder Flexion Overhead with Dowel  - 3 x daily - 7 x weekly - 1 sets - 5-10 reps - Seated Gripping Towel  - 3 x daily - 7 x weekly - 1 sets - 10 reps - Seated Isometric Elbow Flexion  - 2 x daily - 7 x weekly - 2 sets - 10 reps - 5 hold - Seated Isometric Elbow Extension  - 1 x daily - 7 x weekly - 2 sets - 10 reps - 5 hold ASSESSMENT:  CLINICAL IMPRESSION: The patient is progressing as expected s/p TSR.  His pain  level has been very good so far and he is tolerating active assisted shoulder elevation to 90 degrees.  He did well with addition overhead pulleys to 90 flexion. Pt continues to wear his sling and will see MD on 2/11.26.  Grip strength is improved to 66# today.  Therapist monitoring response to all interventions and adherence to ROM restrictions. Patient will benefit from skilled PT to address the below impairments and improve overall function.   OBJECTIVE IMPAIRMENTS: decreased activity tolerance, decreased endurance, decreased ROM, decreased strength, increased edema, increased fascial restrictions, increased muscle spasms, impaired flexibility, impaired UE functional use, postural dysfunction, and pain.   ACTIVITY LIMITATIONS: carrying, lifting, bathing, toileting, dressing, self feeding, reach over head, and hygiene/grooming  PARTICIPATION LIMITATIONS: meal prep, cleaning, laundry, driving, shopping, and yard work  PERSONAL FACTORS: Age, Time since onset of injury/illness/exacerbation, and 1-2 comorbidities: prior shoulder replacement, cancer are also affecting patient's functional outcome.  REHAB POTENTIAL: Good  CLINICAL DECISION MAKING: Stable/uncomplicated  EVALUATION COMPLEXITY: Moderate  GOALS: Goals reviewed with patient? Yes  SHORT TERM GOALS: Target date: 12/09/2024    Be independent in initial HEP Baseline:11/21/24 Goal status: MET  2.  Improve Rt grip to > or = to 65# to improve use of Rt UE for ADLs Baseline: 66# (11/28/24) Goal status: MET  3.  Wean from sling and report use of Rt UE for light tasks including eating and hygiene (after 12/04/24) Baseline:  Goal status: INITIAL  4.  Demonstrate Rt shoulder A/AROM flexion and scaption to 90 degrees to improve shoulder mobility Baseline: 11/21/24 Goal status: MET    LONG TERM GOALS: Target date: 01/06/2025    Be independent in advanced HEP Baseline:  Goal status: INITIAL  2.  Improve QuickDASH to < or = to 50%  to improve functional use  Baseline: 72.7%  Goal status: INITIAL  3.  Demonstrate Rt shoulder A/ROM flexion to > or = to 95 degrees to improve reaching overhead  Baseline: 60 degrees P/ROM Goal status: INITIAL  4.  demo > or = Rt shoulder A/ROM ER to occiput  to improve grooming  Baseline:  Goal status: INITIAL  5.  Report >or = to 60% use of Rt UE with ADLs and self-care due to improve A/ROM and strength of Rt UE Baseline:  Goal status: INITIAL  6.  Report < or = to 4/10 Rt shoulder pain with use with ADLs and self-care Baseline:  Goal status: INITIAL  PLAN: PT FREQUENCY: 2x/week  PT DURATION: 8 weeks  PLANNED INTERVENTIONS: 97110-Therapeutic exercises, 97530- Therapeutic activity, 97112- Neuromuscular re-education, 97535- Self Care, 02859- Manual therapy, [phone]- Canalith repositioning, [id number]- Aquatic Therapy, [phone]- Electrical stimulation (unattended), 97016- Vasopneumatic device, 20560 (1-2 muscles), 20561 (3+ muscles)- Dry Needling, Patient/Family education, Joint mobilization, Scar mobilization, Vestibular training, Cryotherapy, and Moist heat  PLAN FOR NEXT SESSION: follow post-op protocol, P/ROM within protocol limits, Pt will see MD 12/04/24 and will move to 2nd phase of protocol   Burnard Joy, PT 11/28/24 9:29 AM     [facility] [address] [place], [place] [address] Phone # [phone] Fax [phone]  "

## 2024-12-04 ENCOUNTER — Ambulatory Visit

## 2024-12-06 ENCOUNTER — Ambulatory Visit: Admitting: Physical Therapy

## 2024-12-09 ENCOUNTER — Ambulatory Visit

## 2024-12-11 ENCOUNTER — Ambulatory Visit

## 2024-12-16 ENCOUNTER — Ambulatory Visit

## 2024-12-19 ENCOUNTER — Ambulatory Visit

## 2025-01-24 ENCOUNTER — Ambulatory Visit: Admitting: Hematology and Oncology

## 2025-01-24 ENCOUNTER — Other Ambulatory Visit
